# Patient Record
Sex: Male | Born: 1955 | Race: White | Hispanic: No | Marital: Married | Smoking: Current some day smoker
Health system: Southern US, Community
[De-identification: ages and names within clinical notes are randomized; demographics above are authoritative.]

## PROBLEM LIST (undated history)

## (undated) DIAGNOSIS — G4730 Sleep apnea, unspecified: Secondary | ICD-10-CM

## (undated) DIAGNOSIS — M1990 Unspecified osteoarthritis, unspecified site: Secondary | ICD-10-CM

## (undated) DIAGNOSIS — E119 Type 2 diabetes mellitus without complications: Secondary | ICD-10-CM

## (undated) DIAGNOSIS — H9190 Unspecified hearing loss, unspecified ear: Secondary | ICD-10-CM

## (undated) DIAGNOSIS — E785 Hyperlipidemia, unspecified: Secondary | ICD-10-CM

## (undated) DIAGNOSIS — T7840XA Allergy, unspecified, initial encounter: Secondary | ICD-10-CM

## (undated) DIAGNOSIS — M109 Gout, unspecified: Secondary | ICD-10-CM

## (undated) DIAGNOSIS — K219 Gastro-esophageal reflux disease without esophagitis: Secondary | ICD-10-CM

## (undated) HISTORY — DX: Unspecified osteoarthritis, unspecified site: M19.90

## (undated) HISTORY — DX: Hyperlipidemia, unspecified: E78.5

## (undated) HISTORY — DX: Type 2 diabetes mellitus without complications: E11.9

## (undated) HISTORY — DX: Allergy, unspecified, initial encounter: T78.40XA

## (undated) HISTORY — PX: POLYPECTOMY: SHX149

## (undated) HISTORY — DX: Gastro-esophageal reflux disease without esophagitis: K21.9

## (undated) HISTORY — PX: COLONOSCOPY: SHX174

## (undated) HISTORY — DX: Sleep apnea, unspecified: G47.30

## (undated) HISTORY — DX: Gout, unspecified: M10.9

---

## 2004-12-17 ENCOUNTER — Ambulatory Visit: Payer: Self-pay

## 2005-02-04 ENCOUNTER — Ambulatory Visit: Payer: Self-pay | Admitting: Internal Medicine

## 2005-02-23 ENCOUNTER — Ambulatory Visit: Payer: Self-pay | Admitting: Internal Medicine

## 2006-03-23 ENCOUNTER — Ambulatory Visit (HOSPITAL_BASED_OUTPATIENT_CLINIC_OR_DEPARTMENT_OTHER): Admission: RE | Admit: 2006-03-23 | Discharge: 2006-03-23 | Payer: Self-pay | Admitting: Orthopedic Surgery

## 2006-03-23 ENCOUNTER — Encounter (INDEPENDENT_AMBULATORY_CARE_PROVIDER_SITE_OTHER): Payer: Self-pay | Admitting: Specialist

## 2008-09-12 ENCOUNTER — Encounter: Admission: RE | Admit: 2008-09-12 | Discharge: 2008-09-12 | Payer: Self-pay | Admitting: Family Medicine

## 2008-09-14 ENCOUNTER — Encounter: Admission: RE | Admit: 2008-09-14 | Discharge: 2008-09-14 | Payer: Self-pay | Admitting: Family Medicine

## 2010-10-22 NOTE — Op Note (Signed)
NAMEGORGE, ALMANZA NO.:  [id number]   MEDICAL RECORD NO.:  [id number]          PATIENT TYPE:  AMB   LOCATION:  DSC                          FACILITY:  [facility]   PHYSICIAN:  Cindee Salt, M.D.       DATE OF BIRTH:  1955-07-27   DATE OF PROCEDURE:  03/23/2006  DATE OF DISCHARGE:                                 OPERATIVE REPORT   PREOPERATIVE DIAGNOSIS:  Mass left middle finger.   POSTOPERATIVE DIAGNOSIS:  Mass left middle finger.   OPERATION:  Excision mass left middle finger.   SURGEON:  Kuzma.  __________  R.N.   ANESTHESIA:  Forearm based IV regional.   HISTORY:  The patient is a 55 year old male with history of a mass over the  PIP joint of his left middle finger.  This appears to be densely adherent to  the skin.  He is desirous of removal being well aware of risks and  complications including stiffness, recurrence, infection, loss of skin,  dystrophy, injury to arteries, nerves, tendons.   PROCEDURE:  The patient is seen in the preoperative area.  The area of mass  marked by both the patient and surgeon, questions encouraged and answered.   The patient was brought to the operating room, where a forearm based IV  regional anesthetic was carried out without difficulty.  He was prepped  using DuraPrep, supine position, left arm free.  A curvilinear incision was  made for the possibility of rotational flap and  carried down through  subcutaneous tissue.  The mass was found to be densely adherent to the skin  was not adherent to the extensor tendon.  The distal margin was identified.  The skin was excised with the mass distally.  The skin undermined  proximally.  The area isolated proximally and the mass was removed with the  skin both proximally and distally.  The skin was undermined over the  proximal phalanx.  The veins were maintained intact.  The skin was then  advanced without tension with the finger in fully extended position.  This  was then closed  with interrupted 5-0 nylon sutures after repair.  A sterile  compressive dressing and splint to the finger was applied maintaining in  full extension.  The patient tolerated the procedure well and was taken to  the recovery for observation in satisfactory condition.  Mass was sent to  Pathology.   DISCHARGE SUMMARY:  The patient is discharged home to return to [facility] [place] in 1 week on Vicodin.           ______________________________  Cindee Salt, M.D.     GK/MEDQ  D:  03/23/2006  T:  03/24/2006  Job:  [id number]

## 2011-08-20 ENCOUNTER — Other Ambulatory Visit: Payer: Self-pay | Admitting: Internal Medicine

## 2011-09-28 ENCOUNTER — Ambulatory Visit (INDEPENDENT_AMBULATORY_CARE_PROVIDER_SITE_OTHER): Payer: 59 | Admitting: Family Medicine

## 2011-09-28 VITALS — BP 128/80 | HR 73 | Temp 98.7°F | Resp 18 | Ht 71.5 in | Wt 309.0 lb

## 2011-09-28 DIAGNOSIS — Z131 Encounter for screening for diabetes mellitus: Secondary | ICD-10-CM

## 2011-09-28 DIAGNOSIS — E782 Mixed hyperlipidemia: Secondary | ICD-10-CM

## 2011-09-28 DIAGNOSIS — E785 Hyperlipidemia, unspecified: Secondary | ICD-10-CM

## 2011-09-28 DIAGNOSIS — Z0000 Encounter for general adult medical examination without abnormal findings: Secondary | ICD-10-CM

## 2011-09-28 LAB — POCT CBC
Granulocyte percent: 61.9 % (ref 37–80)
HCT, POC: 46.6 % (ref 43.5–53.7)
Hemoglobin: 15.4 g/dL (ref 14.1–18.1)
Lymph, poc: 3.1 (ref 0.6–3.4)
MCH, POC: 31.5 pg — AB (ref 27–31.2)
MCHC: 33 g/dL (ref 31.8–35.4)
MCV: 95.3 fL (ref 80–97)
MID (cbc): 0.5 (ref 0–0.9)
MPV: 8.9 fL (ref 0–99.8)
POC Granulocyte: 5.9 (ref 2–6.9)
POC LYMPH PERCENT: 32.8 % (ref 10–50)
POC MID %: 5.3 % (ref 0–12)
Platelet Count, POC: 280 10*3/uL (ref 142–424)
RBC: 4.89 M/uL (ref 4.69–6.13)
RDW, POC: 14.8 %
WBC: 9.5 10*3/uL (ref 4.6–10.2)

## 2011-09-28 LAB — IFOBT (OCCULT BLOOD): IFOBT: NEGATIVE

## 2011-09-28 LAB — POCT GLYCOSYLATED HEMOGLOBIN (HGB A1C): Hemoglobin A1C: 6.1

## 2011-09-28 MED ORDER — ROSUVASTATIN CALCIUM 10 MG PO TABS: 10.0000 mg | ORAL_TABLET | Freq: Every day | ORAL | Status: DC

## 2011-09-28 MED ORDER — COLCHICINE 0.6 MG PO TABS: 0.6000 mg | ORAL_TABLET | Freq: Every day | ORAL | Status: DC

## 2011-09-28 NOTE — Progress Notes (Signed)
  [facility]:  Office Visit  Chief Complaint:  Chief Complaint  Patient presents with  . Hyperlipidemia    needs refill of med  . Gout    needs refill of med  . CPE    HPI: Gabriel Fernandez is a 56 y.o. male who complains of  Here for annual. No health complaints.   Past Medical History  Diagnosis Date  . Hyperlipidemia    History reviewed. No pertinent past surgical history. History   Social History  . Marital Status: Single    Spouse Name: N/A    Number of Children: N/A  . Years of Education: N/A   Social History Main Topics  . Smoking status: Current Everyday Smoker -- 1.0 packs/day for 35 years  . Smokeless tobacco: None  . Alcohol Use: 0.6 oz/week    1 Glasses of wine per week  . Drug Use: No  . Sexually Active: None   Other Topics Concern  . None   Social History Narrative  . None   Family History  Problem Relation Age of Onset  . Cancer Mother   . Ovarian cancer Mother    No Known Allergies Prior to Admission medications   Medication Sig Start Date End Date Taking? Authorizing Provider  CRESTOR 10 MG tablet TAKE 1 TABLET EVERY DAY 08/20/11  Yes Pattricia Boss, PA-C     ROS: The patient denies fevers, chills, night sweats, unintentional weight loss, chest pain, palpitations, wheezing, dyspnea on exertion, nausea, vomiting, abdominal pain, dysuria, hematuria, melena, + numbness, weakness, or tingling.   All other systems have been reviewed and were otherwise negative with the exception of those mentioned in the HPI and as above.    PHYSICAL EXAM: Filed Vitals:   09/28/11 1623  BP: 128/80  Pulse: 73  Temp: 98.7 F (37.1 C)  Resp: 18   Filed Vitals:   09/28/11 1623  Height: 5' 11.5" (1.816 m)  Weight: 309 lb (140.161 kg)   Body mass index is 42.50 kg/(m^2).  General: Alert, no acute distress, morbidly obese white male HEENT:  Normocephalic, atraumatic, oropharynx patent. Fundoscopic exam nl. EOMI, PERRLA, Tm nl.    Cardiovascular:  Regular rate and rhythm, no rubs murmurs or gallops.  No Carotid bruits, radial pulse intact. No pedal edema.  Respiratory: Clear to auscultation bilaterally.  No wheezes, rales, or rhonchi.  No cyanosis, no use of accessory musculature GI: No organomegaly, abdomen is soft and non-tender, positive bowel sounds.  No masses. Skin: No rashes. Neurologic: Facial musculature symmetric. Sensation intact with microfilament exam Psychiatric: Patient is appropriate throughout our interaction. Lymphatic: No cervical lymphadenopathy Musculoskeletal: Gait intact. GU: no lesions on penis, no testicular masses, prostate nl. No ingunal hernias   LABS: No results found for this or any previous visit.   EKG/XRAY:   Primary read interpreted by Dr. Conley Rolls at [facility].   ASSESSMENT/PLAN: Encounter Diagnoses  Name Primary?  . Annual physical exam Yes  . Hyperlipidemia   . Screening for diabetes mellitus   . Gout    Patient doing well except for obesity and tobacco use. He is going to try quitting with patches. He will continue to try to lose weight through [employer]ystem. Basic labs for CPE done today. Hyperlipidemia-compliant, no SEs Screening for T2 DM due to neuropathy in Gabriel Fernandez although microfilament exam was normal Refill on gout medicine     Gabriel Duran PHUONG, DO 09/28/2011 5:26 PM

## 2011-09-30 LAB — LIPID PANEL
Cholesterol: 208 mg/dL — ABNORMAL HIGH (ref 0–200)
HDL: 61 mg/dL (ref >39)
LDL Cholesterol: 106 mg/dL — ABNORMAL HIGH (ref 0–99)
Total CHOL/HDL Ratio: 3.4 ratio
Triglycerides: 204 mg/dL — ABNORMAL HIGH (ref <150)
VLDL: 41 mg/dL — ABNORMAL HIGH (ref 0–40)

## 2011-09-30 LAB — COMPREHENSIVE METABOLIC PANEL
BUN: 15 mg/dL (ref 6–23)
CO2: 25 mEq/L (ref 19–32)
Calcium: 9.4 mg/dL (ref 8.4–10.5)
Chloride: 104 mEq/L (ref 96–112)
Creat: 0.75 mg/dL (ref 0.50–1.35)

## 2011-09-30 LAB — COMPREHENSIVE METABOLIC PANEL WITH GFR
ALT: 30 U/L (ref 0–53)
AST: 19 U/L (ref 0–37)
Albumin: 4.3 g/dL (ref 3.5–5.2)
Alkaline Phosphatase: 67 U/L (ref 39–117)
Glucose, Bld: 85 mg/dL (ref 70–99)
Potassium: 4.5 meq/L (ref 3.5–5.3)
Sodium: 139 meq/L (ref 135–145)
Total Bilirubin: 0.4 mg/dL (ref 0.3–1.2)
Total Protein: 7.3 g/dL (ref 6.0–8.3)

## 2011-09-30 LAB — TSH: TSH: 1.223 u[IU]/mL (ref 0.350–4.500)

## 2011-09-30 LAB — PSA: PSA: 0.19 ng/mL (ref <=4.00)

## 2012-04-29 ENCOUNTER — Other Ambulatory Visit: Payer: Self-pay | Admitting: Family Medicine

## 2013-04-15 ENCOUNTER — Ambulatory Visit: Payer: 59 | Admitting: Emergency Medicine

## 2013-04-15 ENCOUNTER — Other Ambulatory Visit: Payer: Self-pay | Admitting: Radiology

## 2013-04-15 ENCOUNTER — Telehealth: Payer: Self-pay | Admitting: Radiology

## 2013-04-15 ENCOUNTER — Ambulatory Visit (HOSPITAL_COMMUNITY)
Admission: RE | Admit: 2013-04-15 | Discharge: 2013-04-15 | Disposition: A | Discharge status: Home / Self Care | Payer: 59 | Source: Ambulatory Visit | Attending: Emergency Medicine | Admitting: Emergency Medicine

## 2013-04-15 VITALS — BP 128/80 | HR 97 | Temp 97.8°F | Resp 16 | Ht 71.5 in | Wt 317.4 lb

## 2013-04-15 DIAGNOSIS — M79609 Pain in unspecified limb: Secondary | ICD-10-CM | POA: Insufficient documentation

## 2013-04-15 DIAGNOSIS — M79605 Pain in left leg: Secondary | ICD-10-CM

## 2013-04-15 DIAGNOSIS — M5430 Sciatica, unspecified side: Secondary | ICD-10-CM

## 2013-04-15 DIAGNOSIS — M5432 Sciatica, left side: Secondary | ICD-10-CM

## 2013-04-15 MED ORDER — NAPROXEN SODIUM 550 MG PO TABS: 550.0000 mg | ORAL_TABLET | Freq: Two times a day (BID) | ORAL | Status: DC

## 2013-04-15 NOTE — Patient Instructions (Addendum)
Go to [employer] [facility] [address] [employer], use free valet parking go to Admitting then to vascular lab. Go now, they are expecting you. They will call report while you are there.     Sciatica is pain, weakness, numbness, or tingling along the path of the sciatic nerve. The nerve starts in the lower back and runs down the back of each leg. The nerve controls the muscles in the lower leg and in the back of the knee, while also providing sensation to the back of the thigh, lower leg, and the sole of your foot. Sciatica is a symptom of another medical condition. For instance, nerve damage or certain conditions, such as a herniated disk or bone spur on the spine, pinch or put pressure on the sciatic nerve. This causes the pain, weakness, or other sensations normally associated with sciatica. Generally, sciatica only affects one side of the body. CAUSES   Herniated or slipped disc.  Degenerative disk disease.  A pain disorder involving the narrow muscle in the buttocks (piriformis syndrome).  Pelvic injury or fracture.  Pregnancy.  Tumor (rare). SYMPTOMS  Symptoms can vary from mild to very severe. The symptoms usually travel from the low back to the buttocks and down the back of the leg. Symptoms can include:  Mild tingling or dull aches in the lower back, leg, or hip.  Numbness in the back of the calf or sole of the foot.  Burning sensations in the lower back, leg, or hip.  Sharp pains in the lower back, leg, or hip.  Leg weakness.  Severe back pain inhibiting movement. These symptoms may get worse with coughing, sneezing, laughing, or prolonged sitting or standing. Also, being overweight may worsen symptoms. DIAGNOSIS  Your caregiver will perform a physical exam to look for common symptoms of sciatica. He or she may ask you to do certain movements or activities that would trigger sciatic nerve pain. Other tests may be performed to find the cause of the sciatica. These may  include:  Blood tests.  X-rays.  Imaging tests, such as an MRI or CT scan. TREATMENT  Treatment is directed at the cause of the sciatic pain. Sometimes, treatment is not necessary and the pain and discomfort goes away on its own. If treatment is needed, your caregiver may suggest:  Over-the-counter medicines to relieve pain.  Prescription medicines, such as anti-inflammatory medicine, muscle relaxants, or narcotics.  Applying heat or ice to the painful area.  Steroid injections to lessen pain, irritation, and inflammation around the nerve.  Reducing activity during periods of pain.  Exercising and stretching to strengthen your abdomen and improve flexibility of your spine. Your caregiver may suggest losing weight if the extra weight makes the back pain worse.  Physical therapy.  Surgery to eliminate what is pressing or pinching the nerve, such as a bone spur or part of a herniated disk. HOME CARE INSTRUCTIONS   Only take over-the-counter or prescription medicines for pain or discomfort as directed by your caregiver.  Apply ice to the affected area for 20 minutes, 3 4 times a day for the first 48 72 hours. Then try heat in the same way.  Exercise, stretch, or perform your usual activities if these do not aggravate your pain.  Attend physical therapy sessions as directed by your caregiver.  Keep all follow-up appointments as directed by your caregiver.  Do not wear high heels or shoes that do not provide proper support.  Check your mattress to see if it is  too soft. A firm mattress may lessen your pain and discomfort. SEEK IMMEDIATE MEDICAL CARE IF:   You lose control of your bowel or bladder (incontinence).  You have increasing weakness in the lower back, pelvis, buttocks, or legs.  You have redness or swelling of your back.  You have a burning sensation when you urinate.  You have pain that gets worse when you lie down or awakens you at night.  Your pain is worse  than you have experienced in the past.  Your pain is lasting longer than 4 weeks.  You are suddenly losing weight without reason. MAKE SURE YOU:  Understand these instructions.  Will watch your condition.  Will get help right away if you are not doing well or get worse. Document Released: 05/17/2001 Document Revised: 11/22/2011 Document Reviewed: 10/02/2011 [facility] Patient Information 2014 [place], [place].

## 2013-04-15 NOTE — Telephone Encounter (Signed)
Call from the Vascular Lab, the doppler study is negative for DVT, patient advised it is negative, and sent home. He is told we will call him about the MRI

## 2013-04-15 NOTE — Progress Notes (Signed)
[facility] [address], [place] [address] [phone]  Date:  04/15/2013   Name:  Gabriel Fernandez   DOB:  Jul 25, 1955   MRN:  [id number]  PCP:  No primary provider on file.    Chief Complaint: Leg Pain   History of Present Illness:  Gabriel Fernandez is a 57 y.o. very pleasant male patient who presents with the following:  No history of injury or overuse.  Flies to and from [place] frequently.  Last trip 1 week ago.  Noticed pain in his left leg and left groin.  Intermittent in nature and associated with position worse when sitting or standing.  Some numbness in toes and weakness in left leg.  No history of sciatica.  Chronic mild edema in lower extremities.  No improvement with over the counter medications or other home remedies. Denies other complaint or health concern today..  There are no active problems to display for this patient.   Past Medical History  Diagnosis Date  . Hyperlipidemia     History reviewed. No pertinent past surgical history.  History  Substance Use Topics  . Smoking status: Current Every Day Smoker -- 1.00 packs/day for 35 years  . Smokeless tobacco: Not on file  . Alcohol Use: 0.6 oz/week    1 Glasses of wine per week    Family History  Problem Relation Age of Onset  . Cancer Mother   . Ovarian cancer Mother     No Known Allergies  Medication list has been reviewed and updated.  Current Outpatient Prescriptions on File Prior to Visit  Medication Sig Dispense Refill  . rosuvastatin (CRESTOR) 10 MG tablet Take 1 tablet (10 mg total) by mouth daily. Need office visit for additional refills.  30 tablet  0  . colchicine 0.6 MG tablet Take 1 tablet (0.6 mg total) by mouth daily. As needed for flare up  30 tablet  0   No current facility-administered medications on file prior to visit.    Review of Systems:  As per HPI, otherwise negative.    Physical Examination: Filed Vitals:   04/15/13 1440  BP: 128/80   Pulse: 97  Temp: 97.8 F (36.6 C)  Resp: 16   Filed Vitals:   04/15/13 1440  Height: 5' 11.5" (1.816 m)  Weight: 317 lb 6.4 oz (143.972 kg)   Body mass index is 43.66 kg/(m^2). Ideal Body Weight: Weight in (lb) to have BMI = 25: 181.4  GEN: obese, NAD, Non-toxic, A & O x 3 HEENT: Atraumatic, Normocephalic. Neck supple. No masses, No LAD. Ears and Nose: No external deformity. CV: RRR, No M/G/R. No JVD. No thrill. No extra heart sounds. PULM: CTA B, no wheezes, crackles, rhonchi. No retractions. No resp. distress. No accessory muscle use. ABD: S, NT, ND, +BS. No rebound. No HSM. EXTR: No c/c/e NEURO Normal gait.  Mild weakness left leg knee extension. PSYCH: Normally interactive. Conversant. Not depressed or anxious appearing.  Calm demeanor.    Assessment and Plan: Sciatic neuritis Rule out DVT due travel history Anaprox   Signed,  Phillips Odor, MD

## 2013-04-15 NOTE — Progress Notes (Signed)
VASCULAR LAB PRELIMINARY  PRELIMINARY  PRELIMINARY  PRELIMINARY  Left lower extremity venous duplex completed.    Preliminary report:  Left leg is negative for deep and superficial vein thrombosis.  Negative for Baker's cyst on left.  Report called to Amy in Dr. Shela Commons. Anderson's office.  Instructions given to inform patient of negative results and they will call him when they get the MRI set up.  Dustine Bertini, RVT 04/15/2013, 5:00 PM

## 2013-05-01 ENCOUNTER — Ambulatory Visit
Admission: RE | Admit: 2013-05-01 | Discharge: 2013-05-01 | Disposition: A | Discharge status: Home / Self Care | Payer: 59 | Source: Ambulatory Visit | Attending: Emergency Medicine | Admitting: Emergency Medicine

## 2013-05-01 DIAGNOSIS — M5432 Sciatica, left side: Secondary | ICD-10-CM

## 2013-05-02 ENCOUNTER — Other Ambulatory Visit: Payer: Self-pay | Admitting: Emergency Medicine

## 2013-05-02 DIAGNOSIS — M5430 Sciatica, unspecified side: Secondary | ICD-10-CM

## 2013-05-06 ENCOUNTER — Other Ambulatory Visit: Payer: Self-pay | Admitting: Emergency Medicine

## 2013-05-07 NOTE — Telephone Encounter (Signed)
Pt will need follow up for additional refills.

## 2013-06-08 ENCOUNTER — Ambulatory Visit (INDEPENDENT_AMBULATORY_CARE_PROVIDER_SITE_OTHER): Payer: 59 | Admitting: Internal Medicine

## 2013-06-08 VITALS — BP 140/90 | HR 74 | Temp 98.8°F | Resp 16 | Ht 72.0 in | Wt 313.0 lb

## 2013-06-08 DIAGNOSIS — M109 Gout, unspecified: Secondary | ICD-10-CM

## 2013-06-08 DIAGNOSIS — Z23 Encounter for immunization: Secondary | ICD-10-CM

## 2013-06-08 DIAGNOSIS — F17200 Nicotine dependence, unspecified, uncomplicated: Secondary | ICD-10-CM | POA: Insufficient documentation

## 2013-06-08 DIAGNOSIS — M791 Myalgia, unspecified site: Secondary | ICD-10-CM

## 2013-06-08 DIAGNOSIS — E785 Hyperlipidemia, unspecified: Secondary | ICD-10-CM | POA: Insufficient documentation

## 2013-06-08 MED ORDER — COLCHICINE 0.6 MG PO TABS: 0.6000 mg | ORAL_TABLET | Freq: Every day | ORAL | Status: DC

## 2013-06-08 MED ORDER — ATORVASTATIN CALCIUM 20 MG PO TABS: 20.0000 mg | ORAL_TABLET | Freq: Every day | ORAL | Status: DC

## 2013-06-08 NOTE — Progress Notes (Signed)
   Subjective:    Patient ID: Gabriel Fernandez, male    DOB: June 20, 1955, 58 y.o.   MRN: [id number]  HPIneeds flu shot /med refills [facility] and Gout PCP in [place] retired and he is waiting for a new doctor/last labs were similar to those done here in April 2013 Describes no medical problems other than a persistent aching in his left anterior that over the last several months Would like to change Crestor to something cheaper  Still smoking Still overweight severely  Review of Systems  Constitutional: Negative for activity change, appetite change and unexpected weight change.  Eyes: Negative for visual disturbance.  Respiratory: Negative for shortness of breath and wheezing.   Cardiovascular: Negative for chest pain, palpitations and leg swelling.  Neurological: Negative for headaches.       Objective:   Physical Exam BP 140/90  Pulse 74  Temp(Src) 98.8 F (37.1 C) (Oral)  Resp 16  Ht 6' (1.829 m)  Wt 313 lb (141.976 kg)  BMI 42.44 kg/m2  SpO2 97% HEENT clear Heart regular Lungs clear No peripheral edema Peripheral pulses intact Vague tenderness in left anterior thigh without masses or defects       Assessment & Plan:  Gout - he is to discontinue colchicine which has been taking daily for a long time and see if he actually has gout/he needs uric acid testing  I warned him about the potential combination of colchicine and statins Need for prophylactic vaccination and inoculation against influenza - Plan: Flu Vaccine QUAD 36+ mos PF IM (Fluarix)  Smoker  Other and unspecified hyperlipidemia  Severe obesity (BMI >= 40)  Myalgia  Obviously he is at great risk for an acute event/we discussed significant weight loss/he must commit to stopping smoking Suggested he stop Crestor for one month to see if this myalgia resolves May restart as Lipitor He is to set up a CPE either here or in his town  Meds ordered this encounter  Medications  . atorvastatin (LIPITOR) 20 MG  tablet    Sig: Take 1 tablet (20 mg total) by mouth daily.    Dispense:  90 tablet    Refill:  1  . colchicine 0.6 MG tablet    Sig: Take 1 tablet (0.6 mg total) by mouth daily. As needed for flare up    Dispense:  30 tablet    Refill:  0

## 2013-09-06 ENCOUNTER — Ambulatory Visit: Payer: 59 | Admitting: Emergency Medicine

## 2013-09-06 VITALS — BP 132/78 | HR 85 | Temp 98.9°F | Resp 16 | Ht 71.0 in | Wt 314.0 lb

## 2013-09-06 DIAGNOSIS — H1010 Acute atopic conjunctivitis, unspecified eye: Secondary | ICD-10-CM

## 2013-09-06 DIAGNOSIS — Z23 Encounter for immunization: Secondary | ICD-10-CM

## 2013-09-06 MED ORDER — OLOPATADINE HCL 0.1 % OP SOLN: 1.0000 [drp] | Freq: Two times a day (BID) | OPHTHALMIC | Status: DC

## 2013-09-06 NOTE — Progress Notes (Signed)
[facility] [address] [place] [address] [phone]  Date:  09/06/2013   Name:  CARTIER WASHKO   DOB:  19-Jul-1955   MRN:  [id number]  PCP:  No primary provider on file.    Chief Complaint: Immunizations and Eye Drainage   History of Present Illness:  ESIQUIO BOESEN is a 58 y.o. very pleasant male patient who presents with the following:  Traveling to [place] tomorrow and requests a hepatitis A immunization.  Also says he has itchy watery eyes.  No seasonal allergic symptoms in the past.  Not gluing or purulent.  No cough or coryza.  No fever or chills.  Denies other complaint or health concern today.   Patient Active Problem List   Diagnosis Date Noted  . Smoker 06/08/2013  . Other and unspecified hyperlipidemia 06/08/2013  . Gout 06/08/2013  . Severe obesity (BMI >= 40) 06/08/2013    Past Medical History  Diagnosis Date  . Hyperlipidemia     History reviewed. No pertinent past surgical history.  History  Substance Use Topics  . Smoking status: Current Every Day Smoker -- 1.00 packs/day for 35 years  . Smokeless tobacco: Not on file  . Alcohol Use: 0.6 oz/week    1 Glasses of wine per week    Family History  Problem Relation Age of Onset  . Cancer Mother   . Ovarian cancer Mother     No Known Allergies  Medication list has been reviewed and updated.  Current Outpatient Prescriptions on File Prior to Visit  Medication Sig Dispense Refill  . atorvastatin (LIPITOR) 20 MG tablet Take 1 tablet (20 mg total) by mouth daily.  90 tablet  1  . colchicine 0.6 MG tablet Take 1 tablet (0.6 mg total) by mouth daily. As needed for flare up  30 tablet  0  . naproxen sodium (ANAPROX) 550 MG tablet TAKE 1 TABLET BY MOUTH TWICE A DAY WITH A MEAL  14 tablet  0   No current facility-administered medications on file prior to visit.    Review of Systems:  As per HPI, otherwise negative.    Physical Examination: Filed Vitals:   09/06/13 1603   BP: 132/78  Pulse: 85  Temp: 98.9 F (37.2 C)  Resp: 16   Filed Vitals:   09/06/13 1603  Height: 5\' 11"  (1.803 m)  Weight: 314 lb (142.429 kg)   Body mass index is 43.81 kg/(m^2). Ideal Body Weight: Weight in (lb) to have BMI = 25: 178.9  GEN: WDWN, NAD, Non-toxic, A & O x 3 HEENT: Atraumatic, Normocephalic. Neck supple. No masses, No LAD.  Mild conjunctival injection. No exudate or marginal crusting. Ears and Nose: No external deformity. CV: RRR, No M/G/R. No JVD. No thrill. No extra heart sounds. PULM: CTA B, no wheezes, crackles, rhonchi. No retractions. No resp. distress. No accessory muscle use. ABD: S, NT, ND, +BS. No rebound. No HSM. EXTR: No c/c/e NEURO Normal gait.  PSYCH: Normally interactive. Conversant. Not depressed or anxious appearing.  Calm demeanor.    Assessment and Plan: Atopic conjunctivitis patanase HEP A  Follow up in 6-12 months  Signed,  Ellison Carwin, MD

## 2013-09-06 NOTE — Patient Instructions (Signed)

## 2013-11-15 ENCOUNTER — Other Ambulatory Visit: Payer: Self-pay | Admitting: Internal Medicine

## 2013-12-05 ENCOUNTER — Ambulatory Visit (INDEPENDENT_AMBULATORY_CARE_PROVIDER_SITE_OTHER): Payer: 59 | Admitting: Emergency Medicine

## 2013-12-05 VITALS — BP 144/84 | HR 82 | Temp 97.8°F | Resp 18 | Ht 72.5 in | Wt 311.0 lb

## 2013-12-05 DIAGNOSIS — N400 Enlarged prostate without lower urinary tract symptoms: Secondary | ICD-10-CM

## 2013-12-05 DIAGNOSIS — M5430 Sciatica, unspecified side: Secondary | ICD-10-CM

## 2013-12-05 DIAGNOSIS — M5432 Sciatica, left side: Secondary | ICD-10-CM

## 2013-12-05 LAB — CBC WITH DIFFERENTIAL/PLATELET
BASOS ABS: 0 10*3/uL (ref 0.0–0.1)
BASOS PCT: 0 % (ref 0–1)
EOS ABS: 0.4 10*3/uL (ref 0.0–0.7)
Eosinophils Relative: 5 % (ref 0–5)
HCT: 41.6 % (ref 39.0–52.0)
HEMOGLOBIN: 14.7 g/dL (ref 13.0–17.0)
Lymphocytes Relative: 19 % (ref 12–46)
Lymphs Abs: 1.4 10*3/uL (ref 0.7–4.0)
MCH: 32.1 pg (ref 26.0–34.0)
MCHC: 35.3 g/dL (ref 30.0–36.0)
MCV: 90.8 fL (ref 78.0–100.0)
MONOS PCT: 7 % (ref 3–12)
Monocytes Absolute: 0.5 10*3/uL (ref 0.1–1.0)
NEUTROS ABS: 5 10*3/uL (ref 1.7–7.7)
NEUTROS PCT: 69 % (ref 43–77)
PLATELETS: 244 10*3/uL (ref 150–400)
RBC: 4.58 MIL/uL (ref 4.22–5.81)
RDW: 13.4 % (ref 11.5–15.5)
WBC: 7.3 10*3/uL (ref 4.0–10.5)

## 2013-12-05 MED ORDER — TRAMADOL HCL 50 MG PO TABS: 50.0000 mg | ORAL_TABLET | Freq: Three times a day (TID) | ORAL | Status: DC | PRN

## 2013-12-05 MED ORDER — CYCLOBENZAPRINE HCL 10 MG PO TABS: 10.0000 mg | ORAL_TABLET | Freq: Three times a day (TID) | ORAL | Status: DC | PRN

## 2013-12-05 MED ORDER — NAPROXEN SODIUM 550 MG PO TABS: 550.0000 mg | ORAL_TABLET | Freq: Two times a day (BID) | ORAL | Status: AC

## 2013-12-05 MED ORDER — TAMSULOSIN HCL 0.4 MG PO CAPS: 0.4000 mg | ORAL_CAPSULE | Freq: Every day | ORAL | Status: DC

## 2013-12-05 NOTE — Progress Notes (Signed)
[facility] [address] [place] [address] [phone]  Date:  12/05/2013   Name:  Gabriel Fernandez   DOB:  [birth year]   MRN:  [id number]  PCP:  No PCP Per Patient    Chief Complaint: Leg Pain and Foot Swelling   History of Present Illness:  Gabriel Fernandez is a 58 y.o. very pleasant male patient who presents with the following:  Patient with a history of sciatica in left leg now to office with pain and numbness in left leg.  No history of injury or overuse.  History of gout with no symptoms of a flare.  Says the pain and numbness have popped up over the past 4 days and are similar to his last bout of sciatic pain.  He had a consultation with neurosurgeon but no records area available.  Is concerned as he has some swelling and redness on the dorsum of the left foot.  No warmth or tenderness.   Has frequent nighttime urination with dribbling and urgency incontinence.  No improvement with over the counter medications or other home remedies. Denies other complaint or health concern today.   Patient Active Problem List   Diagnosis Date Noted  . Smoker 06/08/2013  . Other and unspecified hyperlipidemia 06/08/2013  . Gout 06/08/2013  . Severe obesity (BMI >= 40) 06/08/2013    Past Medical History  Diagnosis Date  . Hyperlipidemia     History reviewed. No pertinent past surgical history.  History  Substance Use Topics  . Smoking status: Current Every Day Smoker -- 1.00 packs/day for 35 years  . Smokeless tobacco: Not on file  . Alcohol Use: 0.6 oz/week    1 Glasses of wine per week    Family History  Problem Relation Age of Onset  . Cancer Mother   . Ovarian cancer Mother     No Known Allergies  Medication list has been reviewed and updated.  Current Outpatient Prescriptions on File Prior to Visit  Medication Sig Dispense Refill  . atorvastatin (LIPITOR) 20 MG tablet Take 1 tablet (20 mg total) by mouth daily.  90 tablet  1  . colchicine  (COLCRYS) 0.6 MG tablet Take 1 tablet by mouth daily as needed for flare up. Dx code: 274.9. PATIENT NEEDS OFFICE VISIT FOR ADDITIONAL REFILLS  30 tablet  0  . naproxen sodium (ANAPROX) 550 MG tablet TAKE 1 TABLET BY MOUTH TWICE A DAY WITH A MEAL  14 tablet  0  . olopatadine (PATANOL) 0.1 % ophthalmic solution Place 1 drop into both eyes 2 (two) times daily.  5 mL  12   No current facility-administered medications on file prior to visit.    Review of Systems:  As per HPI, otherwise negative.    Physical Examination: Filed Vitals:   12/05/13 1503  BP: 144/84  Pulse: 82  Temp: 97.8 F (36.6 C)  Resp: 18   Filed Vitals:   12/05/13 1503  Height: 6' 0.5" (1.842 m)  Weight: 311 lb (141.069 kg)   Body mass index is 41.58 kg/(m^2). Ideal Body Weight: Weight in (lb) to have BMI = 25: 186.5   GEN: WDWN, NAD, Non-toxic, Alert & Oriented x 3 HEENT: Atraumatic, Normocephalic.  Ears and Nose: No external deformity. EXTR: No clubbing/cyanosis/edema.  Left leg no calf or thigh tenderness, warmth or cellulitis.  NEURO: Normal gait.  PSYCH: Normally interactive. Conversant. Not depressed or anxious appearing.  Calm demeanor.    Assessment and Plan: Sciatic neuritis  Anaprox  Flexeril Ultram Referred back to PT.  Signed,  Ellison Carwin, MD

## 2013-12-05 NOTE — Patient Instructions (Signed)
Sciatica Sciatica is pain, weakness, numbness, or tingling along the path of the sciatic nerve. The nerve starts in the lower back and runs down the back of each leg. The nerve controls the muscles in the lower leg and in the back of the knee, while also providing sensation to the back of the thigh, lower leg, and the sole of your foot. Sciatica is a symptom of another medical condition. For instance, nerve damage or certain conditions, such as a herniated disk or bone spur on the spine, pinch or put pressure on the sciatic nerve. This causes the pain, weakness, or other sensations normally associated with sciatica. Generally, sciatica only affects one side of the body. CAUSES   Herniated or slipped disc.  Degenerative disk disease.  A pain disorder involving the narrow muscle in the buttocks (piriformis syndrome).  Pelvic injury or fracture.  Pregnancy.  Tumor (rare). SYMPTOMS  Symptoms can vary from mild to very severe. The symptoms usually travel from the low back to the buttocks and down the back of the leg. Symptoms can include:  Mild tingling or dull aches in the lower back, leg, or hip.  Numbness in the back of the calf or sole of the foot.  Burning sensations in the lower back, leg, or hip.  Sharp pains in the lower back, leg, or hip.  Leg weakness.  Severe back pain inhibiting movement. These symptoms may get worse with coughing, sneezing, laughing, or prolonged sitting or standing. Also, being overweight may worsen symptoms. DIAGNOSIS  Your caregiver will perform a physical exam to look for common symptoms of sciatica. He or she may ask you to do certain movements or activities that would trigger sciatic nerve pain. Other tests may be performed to find the cause of the sciatica. These may include:  Blood tests.  X-rays.  Imaging tests, such as an MRI or CT scan. TREATMENT  Treatment is directed at the cause of the sciatic pain. Sometimes, treatment is not necessary  and the pain and discomfort goes away on its own. If treatment is needed, your caregiver may suggest:  Over-the-counter medicines to relieve pain.  Prescription medicines, such as anti-inflammatory medicine, muscle relaxants, or narcotics.  Applying heat or ice to the painful area.  Steroid injections to lessen pain, irritation, and inflammation around the nerve.  Reducing activity during periods of pain.  Exercising and stretching to strengthen your abdomen and improve flexibility of your spine. Your caregiver may suggest losing weight if the extra weight makes the back pain worse.  Physical therapy.  Surgery to eliminate what is pressing or pinching the nerve, such as a bone spur or part of a herniated disk. HOME CARE INSTRUCTIONS   Only take over-the-counter or prescription medicines for pain or discomfort as directed by your caregiver.  Apply ice to the affected area for 20 minutes, 3-4 times a day for the first 48-72 hours. Then try heat in the same way.  Exercise, stretch, or perform your usual activities if these do not aggravate your pain.  Attend physical therapy sessions as directed by your caregiver.  Keep all follow-up appointments as directed by your caregiver.  Do not wear high heels or shoes that do not provide proper support.  Check your mattress to see if it is too soft. A firm mattress may lessen your pain and discomfort. SEEK IMMEDIATE MEDICAL CARE IF:   You lose control of your bowel or bladder (incontinence).  You have increasing weakness in the lower back, pelvis, buttocks,   or legs.  You have redness or swelling of your back.  You have a burning sensation when you urinate.  You have pain that gets worse when you lie down or awakens you at night.  Your pain is worse than you have experienced in the past.  Your pain is lasting longer than 4 weeks.  You are suddenly losing weight without reason. MAKE SURE YOU:  Understand these  instructions.  Will watch your condition.  Will get help right away if you are not doing well or get worse. Document Released: 05/17/2001 Document Revised: 11/22/2011 Document Reviewed: 10/02/2011 Exit[employer] Patient Information 2015 [employer]. This information is not intended to replace advice given to you by your health care provider. Make sure you discuss any questions you have with your health care provider.  

## 2013-12-06 ENCOUNTER — Encounter: Payer: Self-pay | Admitting: Family Medicine

## 2013-12-21 ENCOUNTER — Other Ambulatory Visit: Payer: Self-pay | Admitting: Emergency Medicine

## 2013-12-23 NOTE — Telephone Encounter (Signed)
Do you want to give RFs? 

## 2014-01-19 ENCOUNTER — Other Ambulatory Visit: Payer: Self-pay | Admitting: Emergency Medicine

## 2014-01-20 NOTE — Telephone Encounter (Signed)
Dr Ouida Sills, I know you don't want to RF the tramadol, but do you want to give RFs on naproxen or does pt need to RTC for re-eval?

## 2014-01-21 NOTE — Telephone Encounter (Signed)
Needs ov

## 2014-01-24 NOTE — Telephone Encounter (Signed)
Pharm at [facility] on [address] notified and they said they would tell pt.

## 2014-03-23 ENCOUNTER — Other Ambulatory Visit: Payer: Self-pay | Admitting: Internal Medicine

## 2014-04-02 ENCOUNTER — Other Ambulatory Visit: Payer: Self-pay | Admitting: Emergency Medicine

## 2014-06-04 ENCOUNTER — Other Ambulatory Visit: Payer: Self-pay | Admitting: Internal Medicine

## 2014-06-04 ENCOUNTER — Other Ambulatory Visit: Payer: Self-pay | Admitting: Emergency Medicine

## 2014-06-29 ENCOUNTER — Other Ambulatory Visit: Payer: Self-pay | Admitting: Internal Medicine

## 2014-07-01 ENCOUNTER — Other Ambulatory Visit: Payer: Self-pay | Admitting: Internal Medicine

## 2014-08-27 ENCOUNTER — Other Ambulatory Visit: Payer: Self-pay | Admitting: Physician Assistant

## 2014-10-23 ENCOUNTER — Other Ambulatory Visit: Payer: Self-pay | Admitting: Physician Assistant

## 2014-11-03 ENCOUNTER — Ambulatory Visit (INDEPENDENT_AMBULATORY_CARE_PROVIDER_SITE_OTHER): Payer: BLUE CROSS/BLUE SHIELD | Admitting: Family Medicine

## 2014-11-03 VITALS — BP 128/84 | HR 72 | Temp 97.7°F | Resp 20 | Ht 72.0 in | Wt 316.4 lb

## 2014-11-03 DIAGNOSIS — N400 Enlarged prostate without lower urinary tract symptoms: Secondary | ICD-10-CM

## 2014-11-03 DIAGNOSIS — E785 Hyperlipidemia, unspecified: Secondary | ICD-10-CM | POA: Diagnosis not present

## 2014-11-03 DIAGNOSIS — E119 Type 2 diabetes mellitus without complications: Secondary | ICD-10-CM | POA: Diagnosis not present

## 2014-11-03 LAB — POCT GLYCOSYLATED HEMOGLOBIN (HGB A1C): HEMOGLOBIN A1C: 6.7

## 2014-11-03 MED ORDER — TAMSULOSIN HCL 0.4 MG PO CAPS: 0.4000 mg | ORAL_CAPSULE | Freq: Every day | ORAL | Status: DC

## 2014-11-03 MED ORDER — METFORMIN HCL 500 MG PO TABS: 500.0000 mg | ORAL_TABLET | Freq: Two times a day (BID) | ORAL | Status: DC

## 2014-11-03 MED ORDER — ATORVASTATIN CALCIUM 20 MG PO TABS: ORAL_TABLET | ORAL | Status: DC

## 2014-11-03 NOTE — Progress Notes (Signed)
  Subjective:  Patient ID: Gabriel Fernandez, male    DOB: 1956-01-22  Age: 59 y.o. MRN: [id number]  Patient is here for the first time in a while. He is mostly living in [place] now. He needs a refill on his medications. He's been out of his cholesterol medicine for a month. He continues to smoke for a few cigarettes to a pack a day. He drinks some alcohol. He was so if he is diabetic. He has not been getting a lot of regular exercise other intends to. Has a sore place on his right gum it comes and goes. It is pretty well gone right now. He sees his dentist in a month or 2. He has no headaches or dizziness. No chest pains or excessive shortness of breath. He knows his endurance is not much good. His abdomen has no complaints. Still has some urinary difficulties, but it's mostly difficulty holding it in the daytime. Says he probably shouldn't drinks much coffee.   Objective:   Morbidly obese. Alert and oriented. Throat clear. Minimal ulceration in right pupil mucosa. Neck supple without nodes. Chest clear. Heart regular without murmurs. And soft the mass or tenderness. 1-2+ ankle edema.  Results for orders placed or performed in visit on 11/03/14  POCT glycosylated hemoglobin (Hb A1C)  Result Value Ref Range   Hemoglobin A1C 6.7      Assessment & Plan:   Assessment: Obesity Hyperlipidemia Mouth ulcer Tobacco use disorder Edema    Plan: Read the American diabetic Association website  Take metformin twice daily  Return in 3 months He knows he needs to do some lifestyle changes. He is going to see his dentist sometime and will ask about the mouth sore. Patient Instructions  Ask your dentist to check the sore place in her mouth.  Continue lipid-lowering medication which we refilled today.  You need to make some lifestyle decisions or you are going to get in big trouble from the combination or smoking, cholesterol,diabetes and being overweight and deconditioned.  If your urinary  symptoms get worse we should refer you to a neurologist. Please let us know. Advise drinking less coffee.  Begin metformin 500 mg twice daily  Try to return in about 3 months to recheck some labs on you     Asiah Browder, MD 11/03/2014

## 2014-11-03 NOTE — Patient Instructions (Addendum)
Ask your dentist to check the sore place in her mouth.  Continue lipid-lowering medication which we refilled today.  You need to make some lifestyle decisions or you are going to get in big trouble from the combination or smoking, cholesterol,diabetes and being overweight and deconditioned.  If your urinary symptoms get worse we should refer you to a neurologist. Please let us know. Advise drinking less coffee.  Begin metformin 500 mg twice daily  Try to return in about 3 months to recheck some labs on you

## 2014-11-04 LAB — COMPLETE METABOLIC PANEL WITH GFR
ALBUMIN: 4.1 g/dL (ref 3.5–5.2)
ALT: 30 U/L (ref 0–53)
AST: 21 U/L (ref 0–37)
Alkaline Phosphatase: 64 U/L (ref 39–117)
BILIRUBIN TOTAL: 0.4 mg/dL (ref 0.2–1.2)
BUN: 16 mg/dL (ref 6–23)
CO2: 26 mEq/L (ref 19–32)
Calcium: 9.6 mg/dL (ref 8.4–10.5)
Chloride: 103 mEq/L (ref 96–112)
Creat: 0.83 mg/dL (ref 0.50–1.35)
GFR, Est Non African American: >89 mL/min
GLUCOSE: 103 mg/dL — AB (ref 70–99)
POTASSIUM: 4.5 meq/L (ref 3.5–5.3)
Sodium: 139 mEq/L (ref 135–145)
TOTAL PROTEIN: 7 g/dL (ref 6.0–8.3)

## 2014-11-04 LAB — LIPID PANEL
CHOL/HDL RATIO: 3.4 ratio
Cholesterol: 197 mg/dL (ref 0–200)
HDL: 58 mg/dL (ref >=40)
LDL Cholesterol: 104 mg/dL — ABNORMAL HIGH (ref 0–99)
TRIGLYCERIDES: 175 mg/dL — AB (ref <150)
VLDL: 35 mg/dL (ref 0–40)

## 2014-11-05 ENCOUNTER — Encounter: Payer: Self-pay | Admitting: Family Medicine

## 2014-11-05 LAB — PSA: PSA: 0.52 ng/mL (ref <=4.00)

## 2014-12-04 ENCOUNTER — Encounter: Payer: Self-pay | Admitting: Internal Medicine

## 2014-12-08 ENCOUNTER — Other Ambulatory Visit: Payer: Self-pay | Admitting: Family Medicine

## 2015-02-19 ENCOUNTER — Other Ambulatory Visit: Payer: Self-pay

## 2015-02-19 MED ORDER — TAMSULOSIN HCL 0.4 MG PO CAPS: 0.4000 mg | ORAL_CAPSULE | Freq: Every day | ORAL | Status: DC

## 2015-06-14 ENCOUNTER — Other Ambulatory Visit: Payer: Self-pay | Admitting: Urgent Care

## 2015-07-19 ENCOUNTER — Other Ambulatory Visit: Payer: Self-pay | Admitting: Family Medicine

## 2015-09-29 ENCOUNTER — Ambulatory Visit (INDEPENDENT_AMBULATORY_CARE_PROVIDER_SITE_OTHER): Payer: BLUE CROSS/BLUE SHIELD | Admitting: Urgent Care

## 2015-09-29 VITALS — BP 138/90 | HR 68 | Temp 97.7°F | Resp 18 | Wt 311.0 lb

## 2015-09-29 DIAGNOSIS — F17200 Nicotine dependence, unspecified, uncomplicated: Secondary | ICD-10-CM | POA: Diagnosis not present

## 2015-09-29 DIAGNOSIS — E785 Hyperlipidemia, unspecified: Secondary | ICD-10-CM | POA: Diagnosis not present

## 2015-09-29 DIAGNOSIS — F1010 Alcohol abuse, uncomplicated: Secondary | ICD-10-CM | POA: Diagnosis not present

## 2015-09-29 DIAGNOSIS — N400 Enlarged prostate without lower urinary tract symptoms: Secondary | ICD-10-CM

## 2015-09-29 DIAGNOSIS — M109 Gout, unspecified: Secondary | ICD-10-CM | POA: Diagnosis not present

## 2015-09-29 DIAGNOSIS — E119 Type 2 diabetes mellitus without complications: Secondary | ICD-10-CM | POA: Diagnosis not present

## 2015-09-29 DIAGNOSIS — Z0000 Encounter for general adult medical examination without abnormal findings: Secondary | ICD-10-CM

## 2015-09-29 LAB — POCT GLYCOSYLATED HEMOGLOBIN (HGB A1C): Hemoglobin A1C: 6.6

## 2015-09-29 MED ORDER — METFORMIN HCL 500 MG PO TABS: 500.0000 mg | ORAL_TABLET | Freq: Every day | ORAL | Status: DC

## 2015-09-29 MED ORDER — ZOSTER VACCINE LIVE 19400 UNT/0.65ML ~~LOC~~ SUSR: 0.6500 mL | Freq: Once | SUBCUTANEOUS | Status: DC

## 2015-09-29 MED ORDER — TAMSULOSIN HCL 0.4 MG PO CAPS: 0.4000 mg | ORAL_CAPSULE | Freq: Every day | ORAL | Status: DC

## 2015-09-29 NOTE — Progress Notes (Signed)
MRN: [id number]  Subjective:   [name] is a 60 y.o. male presenting for annual physical exam and medication refills.  Medical care team includes: PCP: No PCP Per Patient Vision: Has not gotten consistent eye care. Dental: Dr. Randol Kern does cleanings every 6 months. Specialists: Colonoscopy completed at 60 y/o, Dr. Olevia Perches, was on 10 year follow up. He is due for another.   Patient works as an Teaching laboratory technician. He is married, has 1 adult child. Smokes 1 ppd, has 30 pack year history and is not interested in quitting. He also drinks 1 bottle of wine daily-every other day.  Diabetes - takes Metformin. Compliance is inconsistent. Denies chest pain, shob, n/v, abdominal pain, hematuria, skin infections, foot ulcerations, blurred vision, polydipsia.   HL - takes atorvastatin. Compliance is good. Diet is unhealthy. No exercise. No added salt. Denies myalgia.   BPH - takes Flomax. However, he has not been back for f/u so has not had refills. He reports weak stream, has to try to urinate frequently.   Gabriel Fernandez has Smoker; Other and unspecified hyperlipidemia; Gout; and Severe obesity (BMI >= 40) (HCC) on his problem list.  Gabriel Fernandez has a current medication list which includes the following prescription(s): atorvastatin, colchicine, metformin, tamsulosin, cyclobenzaprine, naproxen sodium, olopatadine, and tramadol. He has No Known Allergies.  Gabriel Fernandez  has a past medical history of Hyperlipidemia. Also  has no past surgical history on file.  His family history includes Cancer in his mother; Ovarian cancer in his mother.  Immunizations: Needs shingles vaccine.   ROS   Objective:   Vitals: BP 138/90 mmHg  Pulse 68  Temp(Src) 97.7 F (36.5 C) (Oral)  Resp 18  Wt 311 lb (141.069 kg)  SpO2 98%  Wt Readings from Last 3 Encounters:  09/29/15 311 lb (141.069 kg)  11/03/14 316 lb 6.4 oz (143.518 kg)  12/05/13 311 lb (141.069 kg)    BP Readings from Last 3 Encounters:  09/29/15  138/90  11/03/14 128/84  12/05/13 144/84   Physical Exam  Constitutional: He is oriented to person, place, and time. He appears well-developed and well-nourished.  Body habitus is obese.  HENT:  TM's intact bilaterally, no effusions or erythema. Nasal turbinates pink and moist, nasal passages patent. No sinus tenderness. Oropharynx clear, mucous membranes moist, dentition in good repair.  Eyes: Conjunctivae and EOM are normal. Pupils are equal, round, and reactive to light. Right eye exhibits no discharge. Left eye exhibits no discharge. No scleral icterus.  Neck: Normal range of motion. Neck supple. No thyromegaly present.  Cardiovascular: Normal rate, regular rhythm and intact distal pulses.  Exam reveals no gallop and no friction rub.   No murmur heard. Pulmonary/Chest: No stridor. No respiratory distress. He has no wheezes. He has no rales.  Abdominal: Soft. Bowel sounds are normal. He exhibits no distension and no mass. There is no tenderness.  Musculoskeletal: Normal range of motion. He exhibits no edema or tenderness.  Various superficial varicosities over both lower extremities.  Lymphadenopathy:    He has no cervical adenopathy.  Neurological: He is alert and oriented to person, place, and time. He has normal reflexes.  Skin: Skin is warm and dry. No rash noted. No erythema. No pallor.  Psychiatric: He has a normal mood and affect.   Results for orders placed or performed in visit on 09/29/15 (from the past 24 hour(s))  POCT glycosylated hemoglobin (Hb A1C)     Status: None   Collection Time: 09/29/15  5:31 PM  Result Value Ref Range   Hemoglobin A1C 6.6    Assessment and Plan :   1. Annual physical exam - Patient has several risk factors, recommended significant lifestyle modifications. - Discussed healthy lifestyle, diet, exercise, preventative care, vaccinations, and addressed patient's concerns.   2. Type 2 diabetes mellitus without complication, without long-term  current use of insulin (HCC) - Stable, continue Metformin once daily with food.  3. Hyperlipidemia - Lipid panel pending  4. BPH (benign prostatic hyperplasia) - PSA pending, refilled Flomax  5. Gout of left foot, unspecified cause, unspecified chronicity - Uric acid level pending  6. Tobacco use disorder - Counseled on smoking cessation. Patient is to start cutting back on cigarette use. He previously failed Chantix and is not really interested in starting another medication for this.  7. Alcohol abuse - Counseled patient on alcohol use. He was significantly mis-informed on the recommendations for daily alcohol use and agreed to cut back.   [name], PA-C [facility] [phone] 09/29/2015  4:54 PM

## 2015-09-29 NOTE — Patient Instructions (Addendum)
Keeping you healthy  Get these tests  Blood pressure- Have your blood pressure checked once a year by your healthcare provider.  Normal blood pressure is 120/80  Weight- Have your body mass index (BMI) calculated to screen for obesity.  BMI is a measure of body fat based on height and weight. You can also calculate your own BMI at [email or web address].  Cholesterol- Have your cholesterol checked every year.  Diabetes- Have your blood sugar checked regularly if you have high blood pressure, high cholesterol, have a family history of diabetes or if you are overweight.  Screening for Colon Cancer- Colonoscopy starting at age 86.  Screening may begin sooner depending on your family history and other health conditions. Follow up colonoscopy as directed by your Gastroenterologist.  Screening for Prostate Cancer- Both blood work (PSA) and a rectal exam help screen for Prostate Cancer.  Screening begins at age 47 with African-American men and at age 57 with Caucasian men.  Screening may begin sooner depending on your family history.  Take these medicines  Aspirin- One aspirin daily can help prevent Heart disease and Stroke.  Flu shot- Every fall.  Tetanus- Every 10 years.  Zostavax- Once after the age of 8 to prevent Shingles.  Pneumonia shot- Once after the age of 75; if you are younger than 52, ask your healthcare provider if you need a Pneumonia shot.  Take these steps  Don't smoke- If you do smoke, talk to your doctor about quitting.  For tips on how to quit, go to www.smokefree.gov or call 1-800-QUIT-NOW.  Be physically active- Exercise 5 days a week for at least 30 minutes.  If you are not already physically active start slow and gradually work up to 30 minutes of moderate physical activity.  Examples of moderate activity include walking briskly, mowing the yard, dancing, swimming, bicycling, etc.  Eat a healthy diet- Eat a variety of healthy food such as fruits, vegetables, low  fat milk, low fat cheese, yogurt, lean meant, poultry, fish, beans, tofu, etc. For more information go to www.thenutritionsource.org  Drink alcohol in moderation- Limit alcohol intake to less than two drinks a day. Never drink and drive.  Dentist- Brush and floss twice daily; visit your dentist twice a year.  Depression- Your emotional health is as important as your physical health. If you're feeling down, or losing interest in things you would normally enjoy please talk to your healthcare provider.  Eye exam- Visit your eye doctor every year.  Safe sex- If you may be exposed to a sexually transmitted infection, use a condom.  Seat belts- Seat belts can save your life; always wear one.  Smoke/Carbon Monoxide detectors- These detectors need to be installed on the appropriate level of your home.  Replace batteries at least once a year.  Skin cancer- When out in the sun, cover up and use sunscreen 15 SPF or higher.  Violence- If anyone is threatening you, please tell your healthcare provider.  Living Will/ Health care power of attorney- Speak with your healthcare provider and family.   Diabetes Mellitus and Food It is important for you to manage your blood sugar (glucose) level. Your blood glucose level can be greatly affected by what you eat. Eating healthier foods in the appropriate amounts throughout the day at about the same time each day will help you control your blood glucose level. It can also help slow or prevent worsening of your diabetes mellitus. Healthy eating may even help you improve the level of your blood  pressure and reach or maintain a healthy weight.  General recommendations for healthful eating and cooking habits include:  Eating meals and snacks regularly. Avoid going long periods of time without eating to lose weight.  Eating a diet that consists mainly of plant-based foods, such as fruits, vegetables, nuts, legumes, and whole grains.  Using low-heat cooking  methods, such as baking, instead of high-heat cooking methods, such as deep frying. Work with your dietitian to make sure you understand how to use the Nutrition Facts information on food labels. HOW CAN FOOD AFFECT ME? Carbohydrates Carbohydrates affect your blood glucose level more than any other type of food. Your dietitian will help you determine how many carbohydrates to eat at each meal and teach you how to count carbohydrates. Counting carbohydrates is important to keep your blood glucose at a healthy level, especially if you are using insulin or taking certain medicines for diabetes mellitus. Alcohol Alcohol can cause sudden decreases in blood glucose (hypoglycemia), especially if you use insulin or take certain medicines for diabetes mellitus. Hypoglycemia can be a life-threatening condition. Symptoms of hypoglycemia (sleepiness, dizziness, and disorientation) are similar to symptoms of having too much alcohol.  If your health care provider has given you approval to drink alcohol, do so in moderation and use the following guidelines:  Women should not have more than one drink per day, and men should not have more than two drinks per day. One drink is equal to:  12 oz of beer.  5 oz of wine.  1 oz of hard liquor.  Do not drink on an empty stomach.  Keep yourself hydrated. Have water, diet soda, or unsweetened iced tea.  Regular soda, juice, and other mixers might contain a lot of carbohydrates and should be counted. WHAT FOODS ARE NOT RECOMMENDED? As you make food choices, it is important to remember that all foods are not the same. Some foods have fewer nutrients per serving than other foods, even though they might have the same number of calories or carbohydrates. It is difficult to get your body what it needs when you eat foods with fewer nutrients. Examples of foods that you should avoid that are high in calories and carbohydrates but low in nutrients include:  Trans fats (most  processed foods list trans fats on the Nutrition Facts label).  Regular soda.  Juice.  Candy.  Sweets, such as cake, pie, doughnuts, and cookies.  Fried foods. WHAT FOODS CAN I EAT? Eat nutrient-rich foods, which will nourish your body and keep you healthy. The food you should eat also will depend on several factors, including:  The calories you need.  The medicines you take.  Your weight.  Your blood glucose level.  Your blood pressure level.  Your cholesterol level. You should eat a variety of foods, including:  Protein.  Lean cuts of meat.  Proteins low in saturated fats, such as fish, egg whites, and beans. Avoid processed meats.  Fruits and vegetables.  Fruits and vegetables that may help control blood glucose levels, such as apples, mangoes, and yams.  Dairy products.  Choose fat-free or low-fat dairy products, such as milk, yogurt, and cheese.  Grains, bread, pasta, and rice.  Choose whole grain products, such as multigrain bread, whole oats, and brown rice. These foods may help control blood pressure.  Fats.  Foods containing healthful fats, such as nuts, avocado, olive oil, canola oil, and fish. DOES EVERYONE WITH DIABETES MELLITUS HAVE THE SAME MEAL PLAN? Because every person with diabetes  mellitus is different, there is not one meal plan that works for everyone. It is very important that you meet with a dietitian who will help you create a meal plan that is just right for you.   This information is not intended to replace advice given to you by your health care provider. Make sure you discuss any questions you have with your health care provider.   Document Released: 02/17/2005 Document Revised: 06/13/2014 Document Reviewed: 04/19/2013 Elsevier Interactive Patient Education 2016 [employer].   Smoking Cessation, Tips for Success If you are ready to quit smoking, congratulations! You have chosen to help yourself be healthier. Cigarettes bring  nicotine, tar, carbon monoxide, and other irritants into your body. Your lungs, heart, and blood vessels will be able to work better without these poisons. There are many different ways to quit smoking. Nicotine gum, nicotine patches, a nicotine inhaler, or nicotine nasal spray can help with physical craving. Hypnosis, support groups, and medicines help break the habit of smoking. WHAT THINGS CAN I DO TO MAKE QUITTING EASIER?  Here are some tips to help you quit for good:  Pick a date when you will quit smoking completely. Tell all of your friends and family about your plan to quit on that date.  Do not try to slowly cut down on the number of cigarettes you are smoking. Pick a quit date and quit smoking completely starting on that day.  Throw away all cigarettes.   Clean and remove all ashtrays from your home, work, and car.  On a card, write down your reasons for quitting. Carry the card with you and read it when you get the urge to smoke.  Cleanse your body of nicotine. Drink enough water and fluids to keep your urine clear or pale yellow. Do this after quitting to flush the nicotine from your body.  Learn to predict your moods. Do not let a bad situation be your excuse to have a cigarette. Some situations in your life might tempt you into wanting a cigarette.  Never have "just one" cigarette. It leads to wanting another and another. Remind yourself of your decision to quit.  Change habits associated with smoking. If you smoked while driving or when feeling stressed, try other activities to replace smoking. Stand up when drinking your coffee. Brush your teeth after eating. Sit in a different chair when you read the paper. Avoid alcohol while trying to quit, and try to drink fewer caffeinated beverages. Alcohol and caffeine may urge you to smoke.  Avoid foods and drinks that can trigger a desire to smoke, such as sugary or spicy foods and alcohol.  Ask people who smoke not to smoke around  you.  Have something planned to do right after eating or having a cup of coffee. For example, plan to take a walk or exercise.  Try a relaxation exercise to calm you down and decrease your stress. Remember, you may be tense and nervous for the first 2 weeks after you quit, but this will pass.  Find new activities to keep your hands busy. Play with a pen, coin, or rubber band. Doodle or draw things on paper.  Brush your teeth right after eating. This will help cut down on the craving for the taste of tobacco after meals. You can also try mouthwash.   Use oral substitutes in place of cigarettes. Try using lemon drops, carrots, cinnamon sticks, or chewing gum. Keep them handy so they are available when you have the urge  to smoke.  When you have the urge to smoke, try deep breathing.  Designate your home as a nonsmoking area.  If you are a heavy smoker, ask your health care provider about a prescription for nicotine chewing gum. It can ease your withdrawal from nicotine.  Reward yourself. Set aside the cigarette money you save and buy yourself something nice.  Look for support from others. Join a support group or smoking cessation program. Ask someone at home or at work to help you with your plan to quit smoking.  Always ask yourself, "Do I need this cigarette or is this just a reflex?" Tell yourself, "Today, I choose not to smoke," or "I do not want to smoke." You are reminding yourself of your decision to quit.  Do not replace cigarette smoking with electronic cigarettes (commonly called e-cigarettes). The safety of e-cigarettes is unknown, and some may contain harmful chemicals.  If you relapse, do not give up! Plan ahead and think about what you will do the next time you get the urge to smoke. HOW WILL I FEEL WHEN I QUIT SMOKING? You may have symptoms of withdrawal because your body is used to nicotine (the addictive substance in cigarettes). You may crave cigarettes, be irritable, feel  very hungry, cough often, get headaches, or have difficulty concentrating. The withdrawal symptoms are only temporary. They are strongest when you first quit but will go away within 10-14 days. When withdrawal symptoms occur, stay in control. Think about your reasons for quitting. Remind yourself that these are signs that your body is healing and getting used to being without cigarettes. Remember that withdrawal symptoms are easier to treat than the major diseases that smoking can cause.  Even after the withdrawal is over, expect periodic urges to smoke. However, these cravings are generally short lived and will go away whether you smoke or not. Do not smoke! WHAT RESOURCES ARE AVAILABLE TO HELP ME QUIT SMOKING? Your health care provider can direct you to community resources or hospitals for support, which may include:  Group support.  Education.  Hypnosis.  Therapy.   This information is not intended to replace advice given to you by your health care provider. Make sure you discuss any questions you have with your health care provider.   Document Released: 02/19/2004 Document Revised: 06/13/2014 Document Reviewed: 11/08/2012 Elsevier Interactive Patient Education 2016 Elsevier Inc.   Peripheral Vascular Disease Peripheral vascular disease (PVD) is a disease of the blood vessels that are not part of your heart and brain. A simple term for PVD is poor circulation. In most cases, PVD narrows the blood vessels that carry blood from your heart to the rest of your body. This can result in a decreased supply of blood to your arms, legs, and internal organs, like your stomach or kidneys. However, it most often affects a person's lower legs and feet. There are two types of PVD.  Organic PVD. This is the more common type. It is caused by damage to the structure of blood vessels.  Functional PVD. This is caused by conditions that make blood vessels contract and tighten (spasm). Without treatment,  PVD tends to get worse over time. PVD can also lead to acute ischemic limb. This is when an arm or limb suddenly has trouble getting enough blood. This is a medical emergency. CAUSES Each type of PVD has many different causes. The most common cause of PVD is buildup of a fatty material (plaque) inside of your arteries (atherosclerosis). Small amounts of  plaque can break off from the walls of the blood vessels and become lodged in a smaller artery. This blocks blood flow and can cause acute ischemic limb. Other common causes of PVD include:  Blood clots that form inside of blood vessels.  Injuries to blood vessels.  Diseases that cause inflammation of blood vessels or cause blood vessel spasms.  Health behaviors and health history that increase your risk of developing PVD. RISK FACTORS  You may have a greater risk of PVD if you:  Have a family history of PVD.  Have certain medical conditions, including:  High cholesterol.  Diabetes.  High blood pressure (hypertension).  Coronary heart disease.  Past problems with blood clots.  Past injury, such as burns or a broken bone. These may have damaged blood vessels in your limbs.  Buerger disease. This is caused by inflamed blood vessels in your hands and feet.  Some forms of arthritis.  Rare birth defects that affect the arteries in your legs.  Use tobacco.  Do not get enough exercise.  Are obese.  Are age 61 or older. SIGNS AND SYMPTOMS  PVD may cause many different symptoms. Your symptoms depend on what part of your body is not getting enough blood. Some common signs and symptoms include:  Cramps in your lower legs. This may be a symptom of poor leg circulation (claudication).  Pain and weakness in your legs while you are physically active that goes away when you rest (intermittent claudication).  Leg pain when at rest.  Leg numbness, tingling, or weakness.  Coldness in a leg or foot, especially when compared with  the other leg.  Skin or hair changes. These can include:  Hair loss.  Shiny skin.  Pale or bluish skin.  Thick toenails.  Inability to get or maintain an erection (erectile dysfunction). People with PVD are more prone to developing ulcers and sores on their toes, feet, or legs. These may take longer than normal to heal. DIAGNOSIS Your health care provider may diagnose PVD from your signs and symptoms. The health care provider will also do a physical exam. You may have tests to find out what is causing your PVD and determine its severity. Tests may include:  Blood pressure recordings from your arms and legs and measurements of the strength of your pulses (pulse volume recordings).  Imaging studies using sound waves to take pictures of the blood flow through your blood vessels (Doppler ultrasound).  Injecting a dye into your blood vessels before having imaging studies using:  X-rays (angiogram or arteriogram).  Computer-generated X-rays (CT angiogram).  A powerful electromagnetic field and a computer (magnetic resonance angiogram or MRA). TREATMENT Treatment for PVD depends on the cause of your condition and the severity of your symptoms. It also depends on your age. Underlying causes need to be treated and controlled. These include long-lasting (chronic) conditions, such as diabetes, high cholesterol, and high blood pressure. You may need to first try making lifestyle changes and taking medicines. Surgery may be needed if these do not work. Lifestyle changes may include:  Quitting smoking.  Exercising regularly.  Following a low-fat, low-cholesterol diet. Medicines may include:  Blood thinners to prevent blood clots.  Medicines to improve blood flow.  Medicines to improve your blood cholesterol levels. Surgical procedures may include:  A procedure that uses an inflated balloon to open a blocked artery and improve blood flow (angioplasty).  A procedure to put in a tube  (stent) to keep a blocked artery open (stent  implant).  Surgery to reroute blood flow around a blocked artery (peripheral bypass surgery).  Surgery to remove dead tissue from an infected wound on the affected limb.  Amputation. This is surgical removal of the affected limb. This may be necessary in cases of acute ischemic limb that are not improved through medical or surgical treatments. HOME CARE INSTRUCTIONS  Take medicines only as directed by your health care provider.  Do not use any tobacco products, including cigarettes, chewing tobacco, or electronic cigarettes. If you need help quitting, ask your health care provider.  Lose weight if you are overweight, and maintain a healthy weight as directed by your health care provider.  Eat a diet that is low in fat and cholesterol. If you need help, ask your health care provider.  Exercise regularly. Ask your health care provider to suggest some good activities for you.  Use compression stockings or other mechanical devices as directed by your health care provider.  Take good care of your feet.  Wear comfortable shoes that fit well.  Check your feet often for any cuts or sores. SEEK MEDICAL CARE IF:  You have cramps in your legs while walking.  You have leg pain when you are at rest.  You have coldness in a leg or foot.  Your skin changes.  You have erectile dysfunction.  You have cuts or sores on your feet that are not healing. SEEK IMMEDIATE MEDICAL CARE IF:  Your arm or leg turns cold and blue.  Your arms or legs become red, warm, swollen, painful, or numb.  You have chest pain or trouble breathing.  You suddenly have weakness in your face, arm, or leg.  You become very confused or lose the ability to speak.  You suddenly have a very bad headache or lose your vision.   This information is not intended to replace advice given to you by your health care provider. Make sure you discuss any questions you have with  your health care provider.   Document Released: 06/30/2004 Document Revised: 06/13/2014 Document Reviewed: 10/31/2013 Elsevier Interactive Patient Education 2016 [employer].    IF you received an x-ray today, you will receive an invoice from [facility]. Please contact [facility] at [phone] with questions or concerns regarding your invoice.   IF you received labwork today, you will receive an invoice from [employer]. Please contact [facility] at [phone] with questions or concerns regarding your invoice.   Our billing staff will not be able to assist you with questions regarding bills from these companies.  You will be contacted with the lab results as soon as they are available. The fastest way to get your results is to activate your My Chart account. Instructions are located on the last page of this paperwork. If you have not heard from [place] regarding the results in 2 weeks, please contact this office.

## 2015-09-30 LAB — URIC ACID: Uric Acid, Serum: 7 mg/dL (ref 4.0–7.8)

## 2015-09-30 LAB — CBC
HCT: 43.8 % (ref 38.5–50.0)
Hemoglobin: 15.3 g/dL (ref 13.2–17.1)
MCH: 31.8 pg (ref 27.0–33.0)
MCHC: 34.9 g/dL (ref 32.0–36.0)
MCV: 91.1 fL (ref 80.0–100.0)
MPV: 10.2 fL (ref 7.5–12.5)
PLATELETS: 284 10*3/uL (ref 140–400)
RBC: 4.81 MIL/uL (ref 4.20–5.80)
RDW: 13.7 % (ref 11.0–15.0)
WBC: 8.1 10*3/uL (ref 3.8–10.8)

## 2015-09-30 LAB — COMPLETE METABOLIC PANEL WITH GFR
ALT: 23 U/L (ref 9–46)
AST: 15 U/L (ref 10–35)
Albumin: 3.9 g/dL (ref 3.6–5.1)
Alkaline Phosphatase: 67 U/L (ref 40–115)
BUN: 12 mg/dL (ref 7–25)
CALCIUM: 9.4 mg/dL (ref 8.6–10.3)
CHLORIDE: 104 mmol/L (ref 98–110)
CO2: 20 mmol/L (ref 20–31)
CREATININE: 0.74 mg/dL (ref 0.70–1.25)
GFR, Est African American: >89 mL/min (ref >=60)
GFR, Est Non African American: >89 mL/min (ref >=60)
GLUCOSE: 89 mg/dL (ref 65–99)
POTASSIUM: 4.5 mmol/L (ref 3.5–5.3)
SODIUM: 138 mmol/L (ref 135–146)
Total Bilirubin: 0.5 mg/dL (ref 0.2–1.2)
Total Protein: 7.2 g/dL (ref 6.1–8.1)

## 2015-09-30 LAB — LIPID PANEL
CHOL/HDL RATIO: 3.5 ratio (ref <=5.0)
CHOLESTEROL: 175 mg/dL (ref 125–200)
HDL: 50 mg/dL (ref >=40)
LDL Cholesterol: 80 mg/dL (ref <130)
Triglycerides: 223 mg/dL — ABNORMAL HIGH (ref <150)
VLDL: 45 mg/dL — ABNORMAL HIGH (ref <30)

## 2015-09-30 LAB — PSA: PSA: 0.2 ng/mL (ref <=4.00)

## 2015-09-30 LAB — TSH: TSH: 1.38 m[IU]/L (ref 0.40–4.50)

## 2015-10-01 ENCOUNTER — Encounter: Payer: Self-pay | Admitting: Urgent Care

## 2015-10-01 ENCOUNTER — Telehealth: Payer: Self-pay | Admitting: Urgent Care

## 2015-10-01 DIAGNOSIS — E785 Hyperlipidemia, unspecified: Secondary | ICD-10-CM

## 2015-10-01 MED ORDER — ATORVASTATIN CALCIUM 20 MG PO TABS: ORAL_TABLET | ORAL | Status: DC

## 2015-10-01 NOTE — Telephone Encounter (Signed)
I left voicemail for patient, also refilled atorvastatin. He needs to have this rechecked in 6 months. All other results were mailed out.

## 2015-10-30 ENCOUNTER — Encounter: Payer: Self-pay | Admitting: Urgent Care

## 2016-01-25 ENCOUNTER — Other Ambulatory Visit: Payer: Self-pay | Admitting: Family Medicine

## 2016-01-25 DIAGNOSIS — E119 Type 2 diabetes mellitus without complications: Secondary | ICD-10-CM

## 2016-04-01 ENCOUNTER — Ambulatory Visit (INDEPENDENT_AMBULATORY_CARE_PROVIDER_SITE_OTHER): Payer: Commercial Managed Care - HMO | Admitting: Urgent Care

## 2016-04-01 VITALS — BP 140/88 | HR 71 | Temp 98.2°F | Resp 20 | Ht 71.5 in | Wt 307.8 lb

## 2016-04-01 DIAGNOSIS — E119 Type 2 diabetes mellitus without complications: Secondary | ICD-10-CM

## 2016-04-01 DIAGNOSIS — E785 Hyperlipidemia, unspecified: Secondary | ICD-10-CM

## 2016-04-01 DIAGNOSIS — N400 Enlarged prostate without lower urinary tract symptoms: Secondary | ICD-10-CM | POA: Diagnosis not present

## 2016-04-01 DIAGNOSIS — Z1211 Encounter for screening for malignant neoplasm of colon: Secondary | ICD-10-CM

## 2016-04-01 DIAGNOSIS — M79604 Pain in right leg: Secondary | ICD-10-CM | POA: Diagnosis not present

## 2016-04-01 MED ORDER — TAMSULOSIN HCL 0.4 MG PO CAPS: 0.4000 mg | ORAL_CAPSULE | Freq: Every day | ORAL | 5 refills | Status: DC

## 2016-04-01 MED ORDER — ATORVASTATIN CALCIUM 20 MG PO TABS: ORAL_TABLET | ORAL | 3 refills | Status: DC

## 2016-04-01 MED ORDER — MELOXICAM 7.5 MG PO TABS: 7.5000 mg | ORAL_TABLET | Freq: Every day | ORAL | 0 refills | Status: DC

## 2016-04-01 MED ORDER — METFORMIN HCL 500 MG PO TABS: 500.0000 mg | ORAL_TABLET | Freq: Every day | ORAL | 3 refills | Status: DC

## 2016-04-01 NOTE — Progress Notes (Addendum)
By signing my name below, I, Gabriel Fernandez, attest that this documentation has been prepared under the direction and in the presence of Gabriel Fernandez, [place].  Electronically Signed: Verlee Fernandez, Medical Scribe. 04/01/16. 4:47 PM.  MRN: [id number] DOB: 1955-11-30  Subjective:   Gabriel Fernandez is a 60 y.o. male presenting for chief complaint of Medication Refill (Flomax; metformin; lipitor)  Diabetes - Managed with Metformin. Diet is not compliant. Reports numbness in his feet bilaterally when he sits for long periods, for instance traveling in a plane, has muscle aches in his legs bilaterally. Denies blurred vision, chest pain, shob, heart racing, n/v, abdominal pain. Tries to hydrate well. Smokes 10 cigarettes per day. Drinks ~6 pack per week.  HL -  Managed with Lipitor. Diet as above. Does not exercise.   BPH - Managed with Flomax. He did not get refills at his last visit. Has had weakened stream, feels like he has to urinate frequently.  Right leg pain - Reports several week history of right leg cramping, discomfort. Patient has sedentary work, lifestyle. Has tried Anaprox. Denies trauma, back pain, tingling. Denies storing large items, wallet in his right back pocket.  Current Meds  Medication Sig   atorvastatin (LIPITOR) 20 MG tablet TAKE 1 TABLET BY MOUTH EVERY DAY.   colchicine (COLCRYS) 0.6 MG tablet Take 1 tablet by mouth daily as needed for flare up. Dx code: 274.9. PATIENT NEEDS OFFICE VISIT FOR ADDITIONAL REFILLS   metFORMIN (GLUCOPHAGE) 500 MG tablet Take 1 tablet (500 mg total) by mouth daily with breakfast.   tamsulosin (FLOMAX) 0.4 MG CAPS capsule Take 1 capsule (0.4 mg total) by mouth daily after breakfast.   [name] has No Known Allergies.  Gabriel Fernandez  has a past medical history of Hyperlipidemia. Also  has no past surgical history on file.  Objective:   Vitals: BP 140/88 (BP Location: Left Arm, Patient Position: Sitting, Cuff Size: Large)    Pulse 71    Temp 98.2  F (36.8 C) (Oral)    Resp 20    Ht 5' 11.5" (1.816 m)    Wt (!) 307 lb 12.8 oz (139.6 kg)    SpO2 96%    BMI 42.33 kg/m   Wt Readings from Last 3 Encounters:  04/01/16 (!) 307 lb 12.8 oz (139.6 kg)  09/29/15 (!) 311 lb (141.1 kg)  11/03/14 (!) 316 lb 6.4 oz (143.5 kg)   HLD: Lab Results  Component Value Date   CHOL 175 09/29/2015   HDL 50 09/29/2015   LDLCALC 80 09/29/2015   TRIG 223 (H) 09/29/2015   CHOLHDL 3.5 09/29/2015   Lab Results  Component Value Date   ALT 23 09/29/2015   AST 15 09/29/2015   ALKPHOS 67 09/29/2015   BILITOT 0.5 09/29/2015   Diabetic Foot Form - Detailed   Diabetic Foot Exam - detailed Diabetic Foot exam was performed with the following findings:  Yes 04/01/2016  5:26 PM  Visual Foot Exam completed.:  Yes  Is there a history of foot ulcer?:  No Can the patient see the bottom of their feet?:  Yes (Comment: somewhat because of stiffness) Are the shoes appropriate in style and fit?:  No Is there swelling or and abnormal foot shape?:  Yes (Comment: swelling of both feet) Are the toenails long?:  No Are the toenails thick?:  No Do you have pain in calf while walking?:  Yes (Comment: both) Is there a claw toe deformity?:  No Is there elevated skin temparature?:  No Is there limited skin dorsiflexion?:  No Is there foot or ankle muscle weakness?:  No Are the toenails ingrown?:  No Normal Range of Motion:  Yes Pulse Foot Exam completed.:  Yes  Sensory Foot Exam Completed.:  Yes Swelling:  Yes Semmes-Weinstein Monofilament Test R Foot Test Control:  Pos L Foot Test Control:  Pos  R Site 1-Great Toe:  Pos L Site 1-Great Toe:  Pos  R Site 4:  Pos L Site 4:  Pos  R Site 5:  Pos L Site 5:  Pos       Physical Exam  Constitutional: He is oriented to person, place, and time. He appears well-developed and well-nourished.  HENT:  Mouth/Throat: Oropharynx is clear and moist.  Eyes: No scleral icterus.  Cardiovascular: Normal rate, regular rhythm and  intact distal pulses.  Exam reveals no gallop and no friction rub.   No murmur heard. Pulmonary/Chest: No respiratory distress. He has no wheezes. He has no rales.  Abdominal: Soft. Bowel sounds are normal. He exhibits no distension and no mass. There is no tenderness. There is no guarding.  Musculoskeletal:       Lumbar back: He exhibits normal range of motion, no tenderness, no bony tenderness, no swelling, no edema, no deformity and no spasm.       Right upper leg: He exhibits no tenderness, no bony tenderness, no swelling, no edema and no deformity.       Right lower leg: He exhibits no tenderness, no bony tenderness, no swelling and no edema.  Negative SLR.   Neurological: He is alert and oriented to person, place, and time.  Skin: Skin is warm and dry.  Multiple superficial varicosities in both distal lower extremities.   Assessment and Plan :   1. Hyperlipidemia, unspecified hyperlipidemia type - Refilled statin, consider this as a source of his right leg pain, discussed with patient. Consider switching if conservative management fails as above.  2. Type 2 diabetes mellitus without complication, without long-term current use of insulin ([place]) - Well controlled, refilled Metformin. Recommended dietary modifications. Recheck in 6 months.  3. Benign prostatic hyperplasia, unspecified whether lower urinary tract symptoms present - Refilled Flomax.  4. Right leg pain - CK labs pending, counseled on lifestyle modifications. Use APAP, meloxicam, hydrate well. Consider PT, imaging if no improvement in 4 weeks.  5. Colon cancer screening - Ambulatory referral to Gastroenterology   Gabriel Eagles, PA-C [facility] [phone] 04/01/2016 4:47 PM

## 2016-04-01 NOTE — Patient Instructions (Addendum)
Diabetes Mellitus and Food It is important for you to manage your blood sugar (glucose) level. Your blood glucose level can be greatly affected by what you eat. Eating healthier foods in the appropriate amounts throughout the day at about the same time each day will help you control your blood glucose level. It can also help slow or prevent worsening of your diabetes mellitus. Healthy eating may even help you improve the level of your blood pressure and reach or maintain a healthy weight.  General recommendations for healthful eating and cooking habits include:  Eating meals and snacks regularly. Avoid going long periods of time without eating to lose weight.  Eating a diet that consists mainly of plant-based foods, such as fruits, vegetables, nuts, legumes, and whole grains.  Using low-heat cooking methods, such as baking, instead of high-heat cooking methods, such as deep frying. Work with your dietitian to make sure you understand how to use the Nutrition Facts information on food labels. HOW CAN FOOD AFFECT ME? Carbohydrates Carbohydrates affect your blood glucose level more than any other type of food. Your dietitian will help you determine how many carbohydrates to eat at each meal and teach you how to count carbohydrates. Counting carbohydrates is important to keep your blood glucose at a healthy level, especially if you are using insulin or taking certain medicines for diabetes mellitus. Alcohol Alcohol can cause sudden decreases in blood glucose (hypoglycemia), especially if you use insulin or take certain medicines for diabetes mellitus. Hypoglycemia can be a life-threatening condition. Symptoms of hypoglycemia (sleepiness, dizziness, and disorientation) are similar to symptoms of having too much alcohol.  If your health care provider has given you approval to drink alcohol, do so in moderation and use the following guidelines:  Women should not have more than one drink per day, and men  should not have more than two drinks per day. One drink is equal to:  12 oz of beer.  5 oz of wine.  1 oz of hard liquor.  Do not drink on an empty stomach.  Keep yourself hydrated. Have water, diet soda, or unsweetened iced tea.  Regular soda, juice, and other mixers might contain a lot of carbohydrates and should be counted. WHAT FOODS ARE NOT RECOMMENDED? As you make food choices, it is important to remember that all foods are not the same. Some foods have fewer nutrients per serving than other foods, even though they might have the same number of calories or carbohydrates. It is difficult to get your body what it needs when you eat foods with fewer nutrients. Examples of foods that you should avoid that are high in calories and carbohydrates but low in nutrients include:  Trans fats (most processed foods list trans fats on the Nutrition Facts label).  Regular soda.  Juice.  Candy.  Sweets, such as cake, pie, doughnuts, and cookies.  Fried foods. WHAT FOODS CAN I EAT? Eat nutrient-rich foods, which will nourish your body and keep you healthy. The food you should eat also will depend on several factors, including:  The calories you need.  The medicines you take.  Your weight.  Your blood glucose level.  Your blood pressure level.  Your cholesterol level. You should eat a variety of foods, including:  Protein.  Lean cuts of meat.  Proteins low in saturated fats, such as fish, egg whites, and beans. Avoid processed meats.  Fruits and vegetables.  Fruits and vegetables that may help control blood glucose levels, such as apples, mangoes, and   yams.  Dairy products.  Choose fat-free or low-fat dairy products, such as milk, yogurt, and cheese.  Grains, bread, pasta, and rice.  Choose whole grain products, such as multigrain bread, whole oats, and brown rice. These foods may help control blood pressure.  Fats.  Foods containing healthful fats, such as nuts,  avocado, olive oil, canola oil, and fish. DOES EVERYONE WITH DIABETES MELLITUS HAVE THE SAME MEAL PLAN? Because every person with diabetes mellitus is different, there is not one meal plan that works for everyone. It is very important that you meet with a dietitian who will help you create a meal plan that is just right for you.   This information is not intended to replace advice given to you by your health care provider. Make sure you discuss any questions you have with your health care provider.   Document Released: 02/17/2005 Document Revised: 06/13/2014 Document Reviewed: 04/19/2013 Elsevier Interactive Patient Education 2016 [employer].      IF you received an x-ray today, you will receive an invoice from [facility]. Please contact [facility] at [phone] with questions or concerns regarding your invoice.   IF you received labwork today, you will receive an invoice from [employer]. Please contact [facility] at [phone] with questions or concerns regarding your invoice.   Our billing staff will not be able to assist you with questions regarding bills from these companies.  You will be contacted with the lab results as soon as they are available. The fastest way to get your results is to activate your My Chart account. Instructions are located on the last page of this paperwork. If you have not heard from [place] regarding the results in 2 weeks, please contact this office.

## 2016-04-02 LAB — LIPID PANEL
Cholesterol: 160 mg/dL (ref 125–200)
HDL: 49 mg/dL (ref >=40)
LDL CALC: 83 mg/dL (ref <130)
TRIGLYCERIDES: 138 mg/dL (ref <150)
Total CHOL/HDL Ratio: 3.3 Ratio (ref <=5.0)
VLDL: 28 mg/dL (ref <30)

## 2016-04-02 LAB — COMPLETE METABOLIC PANEL WITH GFR
ALT: 26 U/L (ref 9–46)
AST: 23 U/L (ref 10–35)
Albumin: 4.4 g/dL (ref 3.6–5.1)
Alkaline Phosphatase: 65 U/L (ref 40–115)
BILIRUBIN TOTAL: 0.5 mg/dL (ref 0.2–1.2)
BUN: 14 mg/dL (ref 7–25)
CHLORIDE: 103 mmol/L (ref 98–110)
CO2: 23 mmol/L (ref 20–31)
Calcium: 10.1 mg/dL (ref 8.6–10.3)
Creat: 0.89 mg/dL (ref 0.70–1.25)
Glucose, Bld: 103 mg/dL — ABNORMAL HIGH (ref 65–99)
Potassium: 4.8 mmol/L (ref 3.5–5.3)
Sodium: 138 mmol/L (ref 135–146)
TOTAL PROTEIN: 7.4 g/dL (ref 6.1–8.1)

## 2016-04-02 LAB — HEMOGLOBIN A1C
HEMOGLOBIN A1C: 6.2 % — AB (ref <5.7)
MEAN PLASMA GLUCOSE: 131 mg/dL

## 2016-04-06 LAB — CK: Total CK: 99 U/L (ref 7–232)

## 2016-05-01 ENCOUNTER — Other Ambulatory Visit: Payer: Self-pay | Admitting: Urgent Care

## 2016-05-03 NOTE — Telephone Encounter (Signed)
04/01/16 last ov and meds

## 2016-05-04 NOTE — Telephone Encounter (Signed)
Refilled for 2 weeks.  Return to clinic if symptoms have not improve.

## 2016-05-05 ENCOUNTER — Encounter: Payer: Self-pay | Admitting: Urgent Care

## 2016-07-13 ENCOUNTER — Ambulatory Visit (INDEPENDENT_AMBULATORY_CARE_PROVIDER_SITE_OTHER): Payer: Commercial Managed Care - HMO | Admitting: Emergency Medicine

## 2016-07-13 VITALS — BP 150/82 | HR 70 | Temp 97.7°F | Resp 17 | Ht 71.5 in | Wt 311.0 lb

## 2016-07-13 DIAGNOSIS — M25561 Pain in right knee: Secondary | ICD-10-CM | POA: Diagnosis not present

## 2016-07-13 DIAGNOSIS — Z8739 Personal history of other diseases of the musculoskeletal system and connective tissue: Secondary | ICD-10-CM

## 2016-07-13 DIAGNOSIS — L723 Sebaceous cyst: Secondary | ICD-10-CM

## 2016-07-13 DIAGNOSIS — Z0000 Encounter for general adult medical examination without abnormal findings: Secondary | ICD-10-CM

## 2016-07-13 MED ORDER — DICLOFENAC SODIUM 75 MG PO TBEC: 75.0000 mg | DELAYED_RELEASE_TABLET | Freq: Two times a day (BID) | ORAL | 0 refills | Status: AC

## 2016-07-13 NOTE — Progress Notes (Signed)
Gabriel BARRACLOUGH 61 y.o.   Chief Complaint  Patient presents with  . Knee Pain    RT onset yesterday, driving for an hour and 45 minutes yesterday    HISTORY OF PRESENT ILLNESS: This is a 61 y.o. male complaining of right knee pain that started yesterday. Also mentioned lump in the left earlobe, fingers locking up every now and then, and needs colonoscopy referral.  Knee Pain   The incident occurred 12 to 24 hours ago. There was no injury mechanism. The pain is present in the right knee. The quality of the pain is described as aching. The pain is at a severity of 5/10. The pain is moderate. The pain has been constant since onset. Pertinent negatives include no loss of sensation, muscle weakness, numbness or tingling. The symptoms are aggravated by weight bearing, palpation and movement. He has tried nothing for the symptoms.     Prior to Admission medications   Medication Sig Start Date End Date Taking? Authorizing Provider  atorvastatin (LIPITOR) 20 MG tablet TAKE 1 TABLET BY MOUTH EVERY DAY. 04/01/16  Yes [name], PA-C  colchicine (COLCRYS) 0.6 MG tablet Take 1 tablet by mouth daily as needed for flare up. Dx code: 274.9. PATIENT NEEDS OFFICE VISIT FOR ADDITIONAL REFILLS 11/15/13  Yes Collene Leyden, PA-C  metFORMIN (GLUCOPHAGE) 500 MG tablet Take 1 tablet (500 mg total) by mouth daily with breakfast. 04/01/16  Yes [name], PA-C  tamsulosin (FLOMAX) 0.4 MG CAPS capsule Take 1 capsule (0.4 mg total) by mouth daily after breakfast. 04/01/16  Yes [name], PA-C  Zoster Vaccine Live, PF, (ZOSTAVAX) 60454 UNT/0.65ML injection Inject 19,400 Units into the skin once. 09/29/15  Yes [name], PA-C    No Known Allergies  Patient Active Problem List   Diagnosis Date Noted  . Smoker 06/08/2013  . Other and unspecified hyperlipidemia 06/08/2013  . Gout 06/08/2013  . Severe obesity (BMI >= 40) ([place]) 06/08/2013    Past Medical History:  Diagnosis Date  . Hyperlipidemia     No  past surgical history on file.  Social History   Social History  . Marital status: Single    Spouse name: N/A  . Number of children: N/A  . Years of education: N/A   Occupational History  . Not on file.   Social History Main Topics  . Smoking status: Current Every Day Smoker    Packs/day: 1.00    Years: 35.00  . Smokeless tobacco: Never Used  . Alcohol use 0.6 oz/week    1 Glasses of wine per week  . Drug use: No  . Sexual activity: Not on file   Other Topics Concern  . Not on file   Social History Narrative  . No narrative on file    Family History  Problem Relation Age of Onset  . Cancer Mother   . Ovarian cancer Mother      Review of Systems  Constitutional: Negative for chills and fever.  Respiratory: Negative for cough, hemoptysis, shortness of breath and wheezing.   Cardiovascular: Negative for chest pain, palpitations, orthopnea, leg swelling and PND.  Gastrointestinal: Negative for abdominal pain, nausea and vomiting.  Genitourinary: Negative for dysuria and hematuria.  Musculoskeletal: Positive for joint pain (right knee).  Skin: Negative for rash.  Neurological: Negative for dizziness, tingling, sensory change, focal weakness, numbness and headaches.  All other systems reviewed and are negative.  Vitals:   07/13/16 1527  BP: (!) 150/82  Pulse: 70  Resp: 17  Temp: 97.7 F (36.5 C)     Physical Exam  Constitutional: He is oriented to person, place, and time. He appears well-developed and well-nourished.  HENT:  Head: Normocephalic and atraumatic.  Left earlobe sebaceous cyst posteriorly.  Eyes: Conjunctivae and EOM are normal. Pupils are equal, round, and reactive to light.  Neck: Normal range of motion. Neck supple. No JVD present.  Cardiovascular: Normal rate and regular rhythm.   Pulmonary/Chest: Effort normal.  Abdominal: Soft.  Musculoskeletal:  Right knee: +lateral tenderness; stable with FROM; no signs of DVT to either leg    Lymphadenopathy:    He has no cervical adenopathy.  Neurological: He is alert and oriented to person, place, and time. No sensory deficit. He exhibits normal muscle tone.  Skin: Skin is warm and dry. Capillary refill takes less than 2 seconds.  Psychiatric: He has a normal mood and affect. His behavior is normal.  Vitals reviewed.    ASSESSMENT & PLAN: Galyn was seen today for knee pain.  Diagnoses and all orders for this visit:  Acute pain of right knee -     US Venous Img Lower Unilateral Right; Future  History of trigger finger -     Ambulatory referral to Hand Surgery  Sebaceous cyst Comments: left earlobe  Healthcare maintenance -     Ambulatory referral to Gastroenterology  Other orders -     diclofenac (VOLTAREN) 75 MG EC tablet; Take 1 tablet (75 mg total) by mouth 2 (two) times daily.    Patient Instructions       IF you received an x-ray today, you will receive an invoice from [facility]. Please contact [facility] at [phone] with questions or concerns regarding your invoice.   IF you received labwork today, you will receive an invoice from [facility]. Please contact [facility] at [phone] with questions or concerns regarding your invoice.   Our billing staff will not be able to assist you with questions regarding bills from these companies.  You will be contacted with the lab results as soon as they are available. The fastest way to get your results is to activate your My Chart account. Instructions are located on the last page of this paperwork. If you have not heard from [place] regarding the results in 2 weeks, please contact this office.     Knee Pain Knee pain is a very common symptom and can have many causes. Knee pain often goes away when you follow your health care provider's instructions for relieving pain and discomfort at home. However, knee pain can develop into a condition that needs treatment. Some conditions may  include:  Arthritis caused by wear and tear (osteoarthritis).  Arthritis caused by swelling and irritation (rheumatoid arthritis or gout).  A cyst or growth in your knee.  An infection in your knee joint.  An injury that will not heal.  Damage, swelling, or irritation of the tissues that support your knee (torn ligaments or tendinitis). If your knee pain continues, additional tests may be ordered to diagnose your condition. Tests may include X-rays or other imaging studies of your knee. You may also need to have fluid removed from your knee. Treatment for ongoing knee pain depends on the cause, but treatment may include:  Medicines to relieve pain or swelling.  Steroid injections in your knee.  Physical therapy.  Surgery. HOME CARE INSTRUCTIONS  Take medicines only as directed by your health care provider.  Rest your knee and keep it raised (elevated) while you are  resting.  Do not do things that cause or worsen pain.  Avoid high-impact activities or exercises, such as running, jumping rope, or doing jumping jacks.  Apply ice to the knee area:  Put ice in a plastic bag.  Place a towel between your skin and the bag.  Leave the ice on for 20 minutes, 2-3 times a day.  Ask your health care provider if you should wear an elastic knee support.  Keep a pillow under your knee when you sleep.  Lose weight if you are overweight. Extra weight can put pressure on your knee.  Do not use any tobacco products, including cigarettes, chewing tobacco, or electronic cigarettes. If you need help quitting, ask your health care provider. Smoking may slow the healing of any bone and joint problems that you may have. SEEK MEDICAL CARE IF:  Your knee pain continues, changes, or gets worse.  You have a fever along with knee pain.  Your knee buckles or locks up.  Your knee becomes more swollen. SEEK IMMEDIATE MEDICAL CARE IF:   Your knee joint feels hot to the touch.  You have chest  pain or trouble breathing. This information is not intended to replace advice given to you by your health care provider. Make sure you discuss any questions you have with your health care provider. Document Released: 03/20/2007 Document Revised: 06/13/2014 Document Reviewed: 01/06/2014 Elsevier Interactive Patient Education  2017 Elsevier Inc.      Agustina Caroli, MD [facility]

## 2016-07-13 NOTE — Patient Instructions (Addendum)
     IF you received an x-ray today, you will receive an invoice from [facility]. Please contact [facility] at [phone] with questions or concerns regarding your invoice.   IF you received labwork today, you will receive an invoice from [place]. Please contact [facility] at [phone] with questions or concerns regarding your invoice.   Our billing staff will not be able to assist you with questions regarding bills from these companies.  You will be contacted with the lab results as soon as they are available. The fastest way to get your results is to activate your My Chart account. Instructions are located on the last page of this paperwork. If you have not heard from [place] regarding the results in 2 weeks, please contact this office.     Knee Pain Knee pain is a very common symptom and can have many causes. Knee pain often goes away when you follow your health care provider's instructions for relieving pain and discomfort at home. However, knee pain can develop into a condition that needs treatment. Some conditions may include:  Arthritis caused by wear and tear (osteoarthritis).  Arthritis caused by swelling and irritation (rheumatoid arthritis or gout).  A cyst or growth in your knee.  An infection in your knee joint.  An injury that will not heal.  Damage, swelling, or irritation of the tissues that support your knee (torn ligaments or tendinitis). If your knee pain continues, additional tests may be ordered to diagnose your condition. Tests may include X-rays or other imaging studies of your knee. You may also need to have fluid removed from your knee. Treatment for ongoing knee pain depends on the cause, but treatment may include:  Medicines to relieve pain or swelling.  Steroid injections in your knee.  Physical therapy.  Surgery. HOME CARE INSTRUCTIONS  Take medicines only as directed by your health care provider.  Rest your knee and keep it  raised (elevated) while you are resting.  Do not do things that cause or worsen pain.  Avoid high-impact activities or exercises, such as running, jumping rope, or doing jumping jacks.  Apply ice to the knee area:  Put ice in a plastic bag.  Place a towel between your skin and the bag.  Leave the ice on for 20 minutes, 2-3 times a day.  Ask your health care provider if you should wear an elastic knee support.  Keep a pillow under your knee when you sleep.  Lose weight if you are overweight. Extra weight can put pressure on your knee.  Do not use any tobacco products, including cigarettes, chewing tobacco, or electronic cigarettes. If you need help quitting, ask your health care provider. Smoking may slow the healing of any bone and joint problems that you may have. SEEK MEDICAL CARE IF:  Your knee pain continues, changes, or gets worse.  You have a fever along with knee pain.  Your knee buckles or locks up.  Your knee becomes more swollen. SEEK IMMEDIATE MEDICAL CARE IF:   Your knee joint feels hot to the touch.  You have chest pain or trouble breathing. This information is not intended to replace advice given to you by your health care provider. Make sure you discuss any questions you have with your health care provider. Document Released: 03/20/2007 Document Revised: 06/13/2014 Document Reviewed: 01/06/2014 Elsevier Interactive Patient Education  2017 [employer].

## 2016-07-14 ENCOUNTER — Encounter: Payer: Self-pay | Admitting: Gastroenterology

## 2016-07-15 ENCOUNTER — Encounter (HOSPITAL_COMMUNITY): Payer: Self-pay

## 2016-07-15 ENCOUNTER — Ambulatory Visit (HOSPITAL_COMMUNITY)
Admission: RE | Admit: 2016-07-15 | Discharge: 2016-07-15 | Disposition: A | Discharge status: Home / Self Care | Payer: Commercial Managed Care - HMO | Source: Ambulatory Visit | Attending: Emergency Medicine | Admitting: Emergency Medicine

## 2016-07-15 ENCOUNTER — Telehealth: Payer: Self-pay | Admitting: Emergency Medicine

## 2016-07-15 DIAGNOSIS — M25561 Pain in right knee: Secondary | ICD-10-CM | POA: Insufficient documentation

## 2016-07-15 NOTE — Telephone Encounter (Signed)
Left message on VM scheduled ultrasound doppler @ [facility] at 10am

## 2016-07-15 NOTE — Progress Notes (Signed)
VASCULAR LAB PRELIMINARY  PRELIMINARY  PRELIMINARY  PRELIMINARY  Right lower extremity venous duplex completed.    Preliminary report:  Right:  No evidence of DVT, superficial thrombosis, or Baker's cyst.  Conal Shetley, RVS 07/15/2016, 4:44 PM

## 2016-07-21 ENCOUNTER — Telehealth: Payer: Self-pay | Admitting: Emergency Medicine

## 2016-07-21 ENCOUNTER — Encounter: Payer: Self-pay | Admitting: Emergency Medicine

## 2016-07-21 NOTE — Telephone Encounter (Signed)
-----   Message from [facility], MD sent at 07/16/2016  9:30 AM EST ----- Normal results. Call patient and inform about results.

## 2016-09-05 ENCOUNTER — Ambulatory Visit (AMBULATORY_SURGERY_CENTER): Payer: Self-pay

## 2016-09-05 VITALS — Ht 72.0 in | Wt 315.2 lb

## 2016-09-05 DIAGNOSIS — Z1211 Encounter for screening for malignant neoplasm of colon: Secondary | ICD-10-CM

## 2016-09-05 MED ORDER — NA SULFATE-K SULFATE-MG SULF 17.5-3.13-1.6 GM/177ML PO SOLN: 1.0000 | Freq: Once | ORAL | 0 refills | Status: AC

## 2016-09-05 NOTE — Progress Notes (Signed)
Denies allergies to eggs or soy products. Denies complication of anesthesia or sedation. Denies use of weight loss medication. Denies use of O2.   Emmi instructions declined.  

## 2016-09-06 ENCOUNTER — Encounter: Payer: Self-pay | Admitting: Gastroenterology

## 2016-09-19 ENCOUNTER — Encounter: Payer: Self-pay | Admitting: Gastroenterology

## 2016-09-19 ENCOUNTER — Ambulatory Visit (AMBULATORY_SURGERY_CENTER): Payer: Commercial Managed Care - HMO | Admitting: Gastroenterology

## 2016-09-19 VITALS — BP 123/58 | HR 54 | Temp 96.8°F | Resp 11 | Ht 72.0 in | Wt 315.0 lb

## 2016-09-19 DIAGNOSIS — Z1212 Encounter for screening for malignant neoplasm of rectum: Secondary | ICD-10-CM | POA: Diagnosis not present

## 2016-09-19 DIAGNOSIS — D127 Benign neoplasm of rectosigmoid junction: Secondary | ICD-10-CM | POA: Diagnosis not present

## 2016-09-19 DIAGNOSIS — D120 Benign neoplasm of cecum: Secondary | ICD-10-CM

## 2016-09-19 DIAGNOSIS — K621 Rectal polyp: Secondary | ICD-10-CM | POA: Diagnosis not present

## 2016-09-19 DIAGNOSIS — Z1211 Encounter for screening for malignant neoplasm of colon: Secondary | ICD-10-CM | POA: Diagnosis not present

## 2016-09-19 DIAGNOSIS — D129 Benign neoplasm of anus and anal canal: Secondary | ICD-10-CM

## 2016-09-19 DIAGNOSIS — D128 Benign neoplasm of rectum: Secondary | ICD-10-CM

## 2016-09-19 DIAGNOSIS — D122 Benign neoplasm of ascending colon: Secondary | ICD-10-CM

## 2016-09-19 DIAGNOSIS — D124 Benign neoplasm of descending colon: Secondary | ICD-10-CM

## 2016-09-19 MED ORDER — SODIUM CHLORIDE 0.9 % IV SOLN: 500.0000 mL | INTRAVENOUS | Status: DC

## 2016-09-19 NOTE — Progress Notes (Signed)
Called to room to assist during endoscopic procedure.  Patient ID and intended procedure confirmed with present staff. Received instructions for my participation in the procedure from the performing physician.  

## 2016-09-19 NOTE — Op Note (Signed)
[place] Patient Name: Gabriel Fernandez Procedure Date: 09/19/2016 11:21 AM MRN: [id number] Endoscopist: Remo Lipps P. Armbruster MD, MD Age: 61 Referring MD:  Date of Birth: September 08, 1955 Gender: Male Account #: [id number] Procedure:                Colonoscopy Indications:              Screening for colorectal malignant neoplasm Medicines:                Monitored Anesthesia Care Procedure:                Pre-Anesthesia Assessment:                           - Prior to the procedure, a History and Physical                            was performed, and patient medications and                            allergies were reviewed. The patient's tolerance of                            previous anesthesia was also reviewed. The risks                            and benefits of the procedure and the sedation                            options and risks were discussed with the patient.                            All questions were answered, and informed consent                            was obtained. Prior Anticoagulants: The patient has                            taken no previous anticoagulant or antiplatelet                            agents. ASA Grade Assessment: III - A patient with                            severe systemic disease. After reviewing the risks                            and benefits, the patient was deemed in                            satisfactory condition to undergo the procedure.                           After obtaining informed consent, the colonoscope  was passed under direct vision. Throughout the                            procedure, the patient's blood pressure, pulse, and                            oxygen saturations were monitored continuously. The                            Colonoscope was introduced through the anus and                            advanced to the the cecum, identified by                            appendiceal  orifice and ileocecal valve. The                            colonoscopy was performed without difficulty. The                            patient tolerated the procedure well. The quality                            of the bowel preparation was good. The ileocecal                            valve, appendiceal orifice, and rectum were                            photographed. Scope In: 11:25:31 AM Scope Out: 11:48:36 AM Scope Withdrawal Time: 0 hours 20 minutes 55 seconds  Total Procedure Duration: 0 hours 23 minutes 5 seconds  Findings:                 The perianal and digital rectal examinations were                            normal.                           Two sessile polyps were found in the cecum. The                            polyps were 4 to 6 mm in size. These polyps were                            removed with a cold snare. Resection and retrieval                            were complete.                           A 7 mm polyp was found in the ascending colon. The  polyp was sessile. The polyp was removed with a                            cold snare. Resection and retrieval were complete.                           A 4 mm polyp was found in the descending colon. The                            polyp was sessile. The polyp was removed with a                            cold snare. Resection and retrieval were complete.                           A 6 mm polyp was found in the recto-sigmoid colon.                            The polyp was semi-pedunculated. The polyp was                            removed with a hot snare. Resection and retrieval                            were complete.                           Three semi-pedunculated polyps were found in the                            rectum. The polyps were 4 to 5 mm in size. These                            polyps were removed with a hot snare. Resection and                            retrieval were  complete.                           Multiple medium-mouthed diverticula were found in                            the left colon.                           Internal hemorrhoids were found during retroflexion.                           The exam was otherwise without abnormality. Complications:            No immediate complications. Estimated blood loss:                            Minimal. Estimated Blood Loss:  Estimated blood loss was minimal. Impression:               - Two 4 to 6 mm polyps in the cecum, removed with a                            cold snare. Resected and retrieved.                           - One 7 mm polyp in the ascending colon, removed                            with a cold snare. Resected and retrieved.                           - One 4 mm polyp in the descending colon, removed                            with a cold snare. Resected and retrieved.                           - One 6 mm polyp at the recto-sigmoid colon,                            removed with a hot snare. Resected and retrieved.                           - Three 4 to 5 mm polyps in the rectum, removed                            with a hot snare. Resected and retrieved.                           - Diverticulosis in the left colon.                           - Internal hemorrhoids.                           - The examination was otherwise normal. Recommendation:           - Patient has a contact number available for                            emergencies. The signs and symptoms of potential                            delayed complications were discussed with the                            patient. Return to normal activities tomorrow.                            Written discharge instructions were provided to the  patient.                           - Resume previous diet.                           - Continue present medications.                           - Repeat colonoscopy date  to be determined after                            pending pathology results are reviewed for                            surveillance.                           - No ibuprofen, naproxen, or other non-steroidal                            anti-inflammatory drugs for 2 weeks after polyp                            removal. Remo Lipps P. Armbruster MD, MD 09/19/2016 11:54:44 AM This report has been signed electronically.

## 2016-09-19 NOTE — Patient Instructions (Signed)
YOU HAD AN ENDOSCOPIC PROCEDURE TODAY AT [facility]:   Refer to the procedure report that was given to you for any specific questions about what was found during the examination.  If the procedure report does not answer your questions, please call your gastroenterologist to clarify.  If you requested that your care partner not be given the details of your procedure findings, then the procedure report has been included in a sealed envelope for you to review at your convenience later.  YOU SHOULD EXPECT: Some feelings of bloating in the abdomen. Passage of more gas than usual.  Walking can help get rid of the air that was put into your GI tract during the procedure and reduce the bloating. If you had a lower endoscopy (such as a colonoscopy or flexible sigmoidoscopy) you may notice spotting of blood in your stool or on the toilet paper. If you underwent a bowel prep for your procedure, you may not have a normal bowel movement for a few days.  Please Note:  You might notice some irritation and congestion in your nose or some drainage.  This is from the oxygen used during your procedure.  There is no need for concern and it should clear up in a day or so.  SYMPTOMS TO REPORT IMMEDIATELY:   Following lower endoscopy (colonoscopy or flexible sigmoidoscopy):  Excessive amounts of blood in the stool  Significant tenderness or worsening of abdominal pains  Swelling of the abdomen that is new, acute  Fever of 100F or higher  For urgent or emergent issues, a gastroenterologist can be reached at any hour by calling [phone].   DIET:  We do recommend a small meal at first, but then you may proceed to your regular diet.  Drink plenty of fluids but you should avoid alcoholic beverages for 24 hours.  ACTIVITY:  You should plan to take it easy for the rest of today and you should NOT DRIVE or use heavy machinery until tomorrow (because of the sedation medicines used during the test).     FOLLOW UP: Our staff will call the number listed on your records the next business day following your procedure to check on you and address any questions or concerns that you may have regarding the information given to you following your procedure. If we do not reach you, we will leave a message.  However, if you are feeling well and you are not experiencing any problems, there is no need to return our call.  We will assume that you have returned to your regular daily activities without incident.  If any biopsies were taken you will be contacted by phone or by letter within the next 1-3 weeks.  Please call us at [phone] if you have not heard about the biopsies in 3 weeks.    Await biopsy results to determined next repeat Colonoscopy screening Polyps (handout given) Diverticulosis (handout given) No Ibuprofen, Naproxen, or other non-steriodal anti-inflammatory drugs for two weeks  SIGNATURES/CONFIDENTIALITY: You and/or your care partner have signed paperwork which will be entered into your electronic medical record.  These signatures attest to the fact that that the information above on your After Visit Summary has been reviewed and is understood.  Full responsibility of the confidentiality of this discharge information lies with you and/or your care-partner.

## 2016-09-19 NOTE — Progress Notes (Signed)
To PACU VSS, Report to RN 

## 2016-09-20 ENCOUNTER — Telehealth: Payer: Self-pay

## 2016-09-20 NOTE — Telephone Encounter (Signed)
  Follow up Call-  Call back number 09/19/2016  Post procedure Call Back phone  # [phone]  Permission to leave phone message Yes  Some recent data might be hidden     Patient questions:  Do you have a fever, pain , or abdominal swelling? No. Pain Score  0 *  Have you tolerated food without any problems? Yes.    Have you been able to return to your normal activities? Yes.    Do you have any questions about your discharge instructions: Diet   No. Medications  No. Follow up visit  No.  Do you have questions or concerns about your Care? No.  Actions: * If pain score is 4 or above: No action needed, pain <4.

## 2016-09-20 NOTE — Telephone Encounter (Signed)
Name identifier, left message. 

## 2016-09-26 ENCOUNTER — Encounter: Payer: Self-pay | Admitting: Gastroenterology

## 2016-10-01 ENCOUNTER — Other Ambulatory Visit: Payer: Self-pay | Admitting: Urgent Care

## 2016-10-01 DIAGNOSIS — N400 Enlarged prostate without lower urinary tract symptoms: Secondary | ICD-10-CM

## 2016-10-28 ENCOUNTER — Encounter: Payer: Self-pay | Admitting: Physician Assistant

## 2016-10-28 ENCOUNTER — Ambulatory Visit (INDEPENDENT_AMBULATORY_CARE_PROVIDER_SITE_OTHER): Payer: Commercial Managed Care - HMO | Admitting: Physician Assistant

## 2016-10-28 VITALS — BP 144/74 | HR 88 | Temp 98.6°F | Resp 16 | Ht 71.5 in | Wt 312.8 lb

## 2016-10-28 DIAGNOSIS — R6889 Other general symptoms and signs: Secondary | ICD-10-CM | POA: Diagnosis not present

## 2016-10-28 DIAGNOSIS — Z136 Encounter for screening for cardiovascular disorders: Secondary | ICD-10-CM

## 2016-10-28 DIAGNOSIS — F17200 Nicotine dependence, unspecified, uncomplicated: Secondary | ICD-10-CM | POA: Diagnosis not present

## 2016-10-28 MED ORDER — MELOXICAM 15 MG PO TABS: 15.0000 mg | ORAL_TABLET | Freq: Every day | ORAL | 0 refills | Status: DC

## 2016-10-28 MED ORDER — CYCLOBENZAPRINE HCL 10 MG PO TABS: 10.0000 mg | ORAL_TABLET | Freq: Three times a day (TID) | ORAL | 0 refills | Status: DC | PRN

## 2016-10-28 NOTE — Patient Instructions (Addendum)
Please do not take the mobic with naproxen or ibuprofen. I would like you to take the mobic for at least 2 weeks, and take the flexeril at bedtime.   Please ice the area of that neck as well I am arranging for an abdominal aorta ultrasound.  Please await this contact.    IF you received an x-ray today, you will receive an invoice from [facility]. Please contact [facility] at [phone] with questions or concerns regarding your invoice.   IF you received labwork today, you will receive an invoice from [facility]. Please contact [facility] at [phone] with questions or concerns regarding your invoice.   Our billing staff will not be able to assist you with questions regarding bills from these companies.  You will be contacted with the lab results as soon as they are available. The fastest way to get your results is to activate your My Chart account. Instructions are located on the last page of this paperwork. If you have not heard from [place] regarding the results in 2 weeks, please contact this office.

## 2016-10-28 NOTE — Progress Notes (Signed)
PRIMARY CARE AT [facility] [address], [place] [address] [phone]  Date:  10/28/2016   Name:  Gabriel Fernandez   DOB:  [birth year]   MRN:  [id number]  PCP:  Patient, No Pcp Per    History of Present Illness:  Gabriel Fernandez is a 61 y.o. male patient who presents to PCP with  Chief Complaint  Patient presents with  . Neck Pain    neck pain (spasms) has been happening on and off for a few months.     Intermittent neck pain that occurs for a couple months.  It does not in particular hurts.  Left sided.  No pulsating pain.  "almost a numbing".  He has no noticed any swelling in that area.  He has had no trauma.  No new sleeping area.  Radiates to just below his cheek.     Patient Active Problem List   Diagnosis Date Noted  . Smoker 06/08/2013  . Other and unspecified hyperlipidemia 06/08/2013  . Gout 06/08/2013  . Severe obesity (BMI >= 40) ([place]) 06/08/2013    Past Medical History:  Diagnosis Date  . Allergy   . Diabetes mellitus without complication ([place])   . Gout   . Hyperlipidemia   . Sleep apnea    Not on CPAP    Past Surgical History:  Procedure Laterality Date  . COLONOSCOPY      Social History  Substance Use Topics  . Smoking status: Current Every Day Smoker    Packs/day: 1.00    Years: 35.00  . Smokeless tobacco: Never Used  . Alcohol use 3.6 oz/week    4 Glasses of wine, 2 Cans of beer per week    Family History  Problem Relation Age of Onset  . Cancer Mother   . Ovarian cancer Mother   . Stomach cancer Mother   . Colon cancer Neg Hx   . Esophageal cancer Neg Hx   . Rectal cancer Neg Hx     No Known Allergies  Medication list has been reviewed and updated.  Current Outpatient Prescriptions on File Prior to Visit  Medication Sig Dispense Refill  . atorvastatin (LIPITOR) 20 MG tablet TAKE 1 TABLET BY MOUTH EVERY DAY. 90 tablet 3  . metFORMIN (GLUCOPHAGE) 500 MG tablet Take 1 tablet (500 mg total) by mouth daily with breakfast. 90 tablet  3  . tamsulosin (FLOMAX) 0.4 MG CAPS capsule TAKE 1 CAPSULE BY MOUTH DAILY AFTER BREAKFAST. 30 capsule 5  . colchicine (COLCRYS) 0.6 MG tablet Take 1 tablet by mouth daily as needed for flare up. Dx code: 274.9. PATIENT NEEDS OFFICE VISIT FOR ADDITIONAL REFILLS (Patient not taking: Reported on 09/05/2016) 30 tablet 0  . Zoster Vaccine Live, PF, (ZOSTAVAX) 76734 UNT/0.65ML injection Inject 19,400 Units into the skin once. (Patient not taking: Reported on 10/28/2016) 1 each 0   Current Facility-Administered Medications on File Prior to Visit  Medication Dose Route Frequency Provider Last Rate Last Dose  . 0.9 %  sodium chloride infusion  500 mL Intravenous Continuous Armbruster, Renelda Loma, MD        ROS ROS otherwise unremarkable unless listed above.  Physical Examination: BP (!) 146/81 (BP Location: Right Arm, Patient Position: Sitting, Cuff Size: Large)   Pulse 88   Temp 98.6 F (37 C) (Oral)   Resp 16   Ht 5' 11.5" (1.816 m)   Wt (!) 312 lb 12.8 oz (141.9 kg)   SpO2 98%   BMI 43.02 kg/m  Ideal  Body Weight: Weight in (lb) to have BMI = 25: 181.4  Physical Exam  Constitutional: He is oriented to person, place, and time. He appears well-developed and well-nourished. No distress.  HENT:  Head: Normocephalic and atraumatic.  Eyes: Conjunctivae and EOM are normal. Pupils are equal, round, and reactive to light.  Neck: Full passive range of motion without pain. Muscular tenderness (just inferior to the ear with pain, but noted at this area to be where the sxs are located.  ) present. No spinous process tenderness present. No neck rigidity. Normal range of motion present.  Cardiovascular: Normal rate.   Pulses:      Carotid pulses are 2+ on the right side, and 2+ on the left side. Pulmonary/Chest: Effort normal. No respiratory distress.  Neurological: He is alert and oriented to person, place, and time.  Skin: Skin is warm and dry. He is not diaphoretic.  Psychiatric: He has a normal  mood and affect. His behavior is normal.     Assessment and Plan: Gabriel Fernandez is a 61 y.o. male who is here today for cc of neck symptoms. Advised ice msk relaxant, and anti-inflammatory.  Obtaining screening for abdominal ultrasound screening.  Significant risk factors for atherosclerotic changes.  Will follow up with carotid if this is symptomatic Patient notes that there was an abnormality in dental films but does not recall dental report.  Will obtain these records.  Neck symptom - Plan: cyclobenzaprine (FLEXERIL) 10 MG tablet, meloxicam (MOBIC) 15 MG tablet  Smoker  Screening for AAA (abdominal aortic aneurysm) - Plan: US ABDOMINAL AORTA SCREENING AAA  Ivar Drape, PA-C [facility] 5/27/20185:46 PM

## 2016-11-27 ENCOUNTER — Other Ambulatory Visit: Payer: Self-pay | Admitting: Physician Assistant

## 2016-11-27 DIAGNOSIS — R6889 Other general symptoms and signs: Secondary | ICD-10-CM

## 2017-01-17 ENCOUNTER — Ambulatory Visit
Admission: RE | Admit: 2017-01-17 | Discharge: 2017-01-17 | Disposition: A | Discharge status: Home / Self Care | Payer: 59 | Source: Ambulatory Visit | Attending: Physician Assistant | Admitting: Physician Assistant

## 2017-01-17 DIAGNOSIS — Z136 Encounter for screening for cardiovascular disorders: Secondary | ICD-10-CM

## 2017-01-18 ENCOUNTER — Other Ambulatory Visit: Payer: Self-pay | Admitting: Urgent Care

## 2017-01-18 DIAGNOSIS — N400 Enlarged prostate without lower urinary tract symptoms: Secondary | ICD-10-CM

## 2017-02-04 ENCOUNTER — Other Ambulatory Visit: Payer: Self-pay | Admitting: Urgent Care

## 2017-02-04 DIAGNOSIS — E119 Type 2 diabetes mellitus without complications: Secondary | ICD-10-CM

## 2017-04-04 ENCOUNTER — Other Ambulatory Visit: Payer: Self-pay | Admitting: Urgent Care

## 2017-04-04 DIAGNOSIS — E785 Hyperlipidemia, unspecified: Secondary | ICD-10-CM

## 2017-04-26 ENCOUNTER — Ambulatory Visit (INDEPENDENT_AMBULATORY_CARE_PROVIDER_SITE_OTHER): Payer: BLUE CROSS/BLUE SHIELD | Admitting: Physician Assistant

## 2017-04-26 ENCOUNTER — Encounter: Payer: Self-pay | Admitting: Physician Assistant

## 2017-04-26 VITALS — BP 120/78 | HR 60 | Temp 97.7°F | Resp 16 | Ht 71.25 in | Wt 257.6 lb

## 2017-04-26 DIAGNOSIS — Z0000 Encounter for general adult medical examination without abnormal findings: Secondary | ICD-10-CM | POA: Diagnosis not present

## 2017-04-26 DIAGNOSIS — N400 Enlarged prostate without lower urinary tract symptoms: Secondary | ICD-10-CM

## 2017-04-26 DIAGNOSIS — E119 Type 2 diabetes mellitus without complications: Secondary | ICD-10-CM

## 2017-04-26 DIAGNOSIS — Z23 Encounter for immunization: Secondary | ICD-10-CM | POA: Diagnosis not present

## 2017-04-26 DIAGNOSIS — Z1159 Encounter for screening for other viral diseases: Secondary | ICD-10-CM

## 2017-04-26 DIAGNOSIS — E785 Hyperlipidemia, unspecified: Secondary | ICD-10-CM

## 2017-04-26 MED ORDER — TAMSULOSIN HCL 0.4 MG PO CAPS: 0.4000 mg | ORAL_CAPSULE | Freq: Every day | ORAL | 1 refills | Status: DC

## 2017-04-26 MED ORDER — ATORVASTATIN CALCIUM 20 MG PO TABS: ORAL_TABLET | ORAL | 3 refills | Status: DC

## 2017-04-26 MED ORDER — METFORMIN HCL 500 MG PO TABS: 500.0000 mg | ORAL_TABLET | Freq: Every day | ORAL | 1 refills | Status: DC

## 2017-04-26 NOTE — Progress Notes (Signed)
PRIMARY CARE AT [facility] [address] [place] [address] [phone]  Date:  04/26/2017   Name:  Gabriel Fernandez   DOB:  1956-03-31   MRN:  [id number]  PCP:  Joretta Bachelor, PA    History of Present Illness:  Gabriel Fernandez is a 61 y.o. male patient who presents to PCP with  Chief Complaint  Patient presents with  . Annual Exam     DIET: he is eating "reasonably healthy".  He has stopped eating bread and cheese.  He is eating vegetables daily.  More salads.  Eating more organic.    BM: normal.  No blood or black stool.   URINATION: frequent urination for years.  No hematuria, or dysuria.    SLEEP: "fine" 6-10 hours.  During the work week, 6 hours  SOCIAL ACTIVITY He is going to [place] in few weeks to 1 month EtOH: 7 per week, 2-4 drinks per sitting Tobacco or vaping: 1ppd 30 years Illicit drug use: none Sexually active: normal active.  No difficulty.   Patient would like refill of medications. Dm/cholesterol: does not check his blood sugar.  No diarrhea, polyuria, vision changes, palpitations, chest pains, or sob. Attempts to manage his diet, however with travelling, may have foods without care of cholesterol monitoring.      Patient Active Problem List   Diagnosis Date Noted  . Smoker 06/08/2013  . Other and unspecified hyperlipidemia 06/08/2013  . Gout 06/08/2013  . Severe obesity (BMI >= 40) ([place]) 06/08/2013    Past Medical History:  Diagnosis Date  . Allergy   . Diabetes mellitus without complication ([place])   . Gout   . Hyperlipidemia   . Sleep apnea    Not on CPAP    Past Surgical History:  Procedure Laterality Date  . COLONOSCOPY      Social History   Tobacco Use  . Smoking status: Current Every Day Smoker    Packs/day: 1.00    Years: 35.00    Pack years: 35.00  . Smokeless tobacco: Never Used  Substance Use Topics  . Alcohol use: Yes    Alcohol/week: 3.6 oz    Types: 4 Glasses of wine, 2 Cans of beer per week  . Drug use: No     Family History  Problem Relation Age of Onset  . Cancer Mother   . Ovarian cancer Mother   . Stomach cancer Mother   . Colon cancer Neg Hx   . Esophageal cancer Neg Hx   . Rectal cancer Neg Hx     No Known Allergies  Medication list has been reviewed and updated.  Current Outpatient Medications on File Prior to Visit  Medication Sig Dispense Refill  . atorvastatin (LIPITOR) 20 MG tablet TAKE 1 TABLET BY MOUTH EVERY DAY. 90 tablet 3  . colchicine (COLCRYS) 0.6 MG tablet Take 1 tablet by mouth daily as needed for flare up. Dx code: 274.9. PATIENT NEEDS OFFICE VISIT FOR ADDITIONAL REFILLS 30 tablet 0  . metFORMIN (GLUCOPHAGE) 500 MG tablet Take 1 tablet (500 mg total) by mouth daily with breakfast. Office visit needed for additional refills 90 tablet 0  . tamsulosin (FLOMAX) 0.4 MG CAPS capsule Take 1 capsule (0.4 mg total) by mouth daily after breakfast. Will need an office visit for additional refills. 90 capsule 0  . Zoster Vaccine Live, PF, (ZOSTAVAX) 47654 UNT/0.65ML injection Inject 19,400 Units into the skin once. 1 each 0  . cyclobenzaprine (FLEXERIL) 10 MG tablet Take 1 tablet (  10 mg total) by mouth 3 (three) times daily as needed for muscle spasms. (Patient not taking: Reported on 04/26/2017) 30 tablet 0  . meloxicam (MOBIC) 15 MG tablet Take 1 tablet (15 mg total) by mouth daily. (Patient not taking: Reported on 04/26/2017) 30 tablet 0   Current Facility-Administered Medications on File Prior to Visit  Medication Dose Route Frequency Provider Last Rate Last Dose  . 0.9 %  sodium chloride infusion  500 mL Intravenous Continuous Armbruster, Carlota Raspberry, MD        ROS ROS otherwise unremarkable unless listed above.  Physical Examination: BP 120/78   Pulse 60   Temp 97.7 F (36.5 C) (Oral)   Resp 16   Ht 5' 11.25" (1.81 m)   Wt 257 lb 9.6 oz (116.8 kg)   SpO2 98%   BMI 35.68 kg/m  Ideal Body Weight: Weight in (lb) to have BMI = 25: 180.1  Physical Exam   Constitutional: He is oriented to person, place, and time. He appears well-developed and well-nourished. No distress.  HENT:  Head: Normocephalic and atraumatic.  Right Ear: Tympanic membrane, external ear and ear canal normal.  Left Ear: Tympanic membrane, external ear and ear canal normal.  Eyes: Conjunctivae and EOM are normal. Pupils are equal, round, and reactive to light.  Cardiovascular: Normal rate and regular rhythm. Exam reveals no friction rub.  No murmur heard. Pulses:      Dorsalis pedis pulses are 1+ on the right side, and 1+ on the left side.  Varicosities and spider veins are prominent bilaterally at his extremities.   Pulmonary/Chest: Effort normal. No accessory muscle usage. No respiratory distress. He has no decreased breath sounds. He has no wheezes.  Abdominal: Soft. Bowel sounds are normal. He exhibits no distension and no mass. There is no tenderness.  Musculoskeletal: Normal range of motion. He exhibits no edema or tenderness.  Neurological: He is alert and oriented to person, place, and time. He displays normal reflexes.  Skin: Skin is warm and dry. He is not diaphoretic.  Psychiatric: He has a normal mood and affect. His behavior is normal.     Assessment and Plan: Gabriel Fernandez is a 61 y.o. male who is here today for cc of  Chief Complaint  Patient presents with  . Annual Exam  --labs pending. --refills, with stable features. --immunizations updated.  Annual physical exam - Plan: CBC, CMP14+EGFR, PSA, TSH, Lipid panel, Tdap vaccine greater than or equal to 7yo IM, Hepatitis A vaccine adult IM, Hepatitis C antibody, Hemoglobin A1c  Flu vaccine need - Plan: Flu Vaccine QUAD 36+ mos IM  Need for pneumococcal vaccination - Plan: Pneumococcal conjugate vaccine 13-valent IM  Type 2 diabetes mellitus without complication, without long-term current use of insulin (HCC) - Plan: Hemoglobin A1c, metFORMIN (GLUCOPHAGE) 500 MG tablet  Benign prostatic  hyperplasia, unspecified whether lower urinary tract symptoms present - Plan: tamsulosin (FLOMAX) 0.4 MG CAPS capsule  Hyperlipidemia, unspecified hyperlipidemia type - Plan: atorvastatin (LIPITOR) 20 MG tablet  Need for hepatitis C screening test - Plan: Hepatitis C antibody, Hepatitis C antibody  Need for Tdap vaccination - Plan: Tdap vaccine greater than or equal to 7yo IM  Need for hepatitis A vaccination - Plan: Hepatitis A vaccine adult IM  Ivar Drape, PA-C [facility] 11/27/20189:18 AM

## 2017-04-26 NOTE — Patient Instructions (Addendum)
Keeping you healthy  Get these tests  Blood pressure- Have your blood pressure checked once a year by your healthcare provider.  Normal blood pressure is 120/80  Weight- Have your body mass index (BMI) calculated to screen for obesity.  BMI is a measure of body fat based on height and weight. You can also calculate your own BMI at www.nhlbisuport.com/bmi/.  Cholesterol- Have your cholesterol checked every year.  Diabetes- Have your blood sugar checked regularly if you have high blood pressure, high cholesterol, have a family history of diabetes or if you are overweight.  Screening for Colon Cancer- Colonoscopy starting at age 50.  Screening may begin sooner depending on your family history and other health conditions. Follow up colonoscopy as directed by your Gastroenterologist.  Screening for Prostate Cancer- Both blood work (PSA) and a rectal exam help screen for Prostate Cancer.  Screening begins at age 40 with African-American men and at age 50 with Caucasian men.  Screening may begin sooner depending on your family history.  Take these medicines  Aspirin- One aspirin daily can help prevent Heart disease and Stroke.  Flu shot- Every fall.  Tetanus- Every 10 years.  Zostavax- Once after the age of 60 to prevent Shingles.  Pneumonia shot- Once after the age of 65; if you are younger than 65, ask your healthcare provider if you need a Pneumonia shot.  Take these steps  Don't smoke- If you do smoke, talk to your doctor about quitting.  For tips on how to quit, go to www.smokefree.gov or call 1-800-QUIT-NOW.  Be physically active- Exercise 5 days a week for at least 30 minutes.  If you are not already physically active start slow and gradually work up to 30 minutes of moderate physical activity.  Examples of moderate activity include walking briskly, mowing the yard, dancing, swimming, bicycling, etc.  Eat a healthy diet- Eat a variety of healthy food such as fruits, vegetables,  low fat milk, low fat cheese, yogurt, lean meant, poultry, fish, beans, tofu, etc. For more information go to www.thenutritionsource.org  Drink alcohol in moderation- Limit alcohol intake to less than two drinks a day. Never drink and drive.  Dentist- Brush and floss twice daily; visit your dentist twice a year.  Depression- Your emotional health is as important as your physical health. If you're feeling down, or losing interest in things you would normally enjoy please talk to your healthcare provider.  Eye exam- Visit your eye doctor every year.  Safe sex- If you may be exposed to a sexually transmitted infection, use a condom.  Seat belts- Seat belts can save your life; always wear one.  Smoke/Carbon Monoxide detectors- These detectors need to be installed on the appropriate level of your home.  Replace batteries at least once a year.  Skin cancer- When out in the sun, cover up and use sunscreen 15 SPF or higher.  Violence- If anyone is threatening you, please tell your healthcare provider.  Living Will/ Health care power of attorney- Speak with your healthcare provider and family.   IF you received an x-ray today, you will receive an invoice from [facility]. Please contact  Radiology at [phone] with questions or concerns regarding your invoice.   IF you received labwork today, you will receive an invoice from [facility]. Please contact [facility] at [phone] with questions or concerns regarding your invoice.   Our billing staff will not be able to assist you with questions regarding bills from these companies.  You will be contacted   with the lab results as soon as they are available. The fastest way to get your results is to activate your My Chart account. Instructions are located on the last page of this paperwork. If you have not heard from us regarding the results in 2 weeks, please contact this office.     

## 2017-04-27 LAB — CMP14+EGFR
ALBUMIN: 4.2 g/dL (ref 3.6–4.8)
ALT: 17 IU/L (ref 0–44)
AST: 18 IU/L (ref 0–40)
Albumin/Globulin Ratio: 1.6 (ref 1.2–2.2)
Alkaline Phosphatase: 74 IU/L (ref 39–117)
BUN/Creatinine Ratio: 14 (ref 10–24)
BUN: 13 mg/dL (ref 8–27)
Bilirubin Total: 0.5 mg/dL (ref 0.0–1.2)
CO2: 23 mmol/L (ref 20–29)
CREATININE: 0.9 mg/dL (ref 0.76–1.27)
Calcium: 9.5 mg/dL (ref 8.6–10.2)
Chloride: 106 mmol/L (ref 96–106)
GFR calc Af Amer: 105 mL/min/{1.73_m2} (ref >59)
GFR calc non Af Amer: 91 mL/min/{1.73_m2} (ref >59)
GLUCOSE: 115 mg/dL — AB (ref 65–99)
Globulin, Total: 2.6 g/dL (ref 1.5–4.5)
Potassium: 4.3 mmol/L (ref 3.5–5.2)
Sodium: 143 mmol/L (ref 134–144)
Total Protein: 6.8 g/dL (ref 6.0–8.5)

## 2017-04-27 LAB — LIPID PANEL
Chol/HDL Ratio: 3.5 ratio (ref 0.0–5.0)
Cholesterol, Total: 159 mg/dL (ref 100–199)
HDL: 46 mg/dL (ref >39)
LDL CALC: 85 mg/dL (ref 0–99)
Triglycerides: 141 mg/dL (ref 0–149)
VLDL CHOLESTEROL CAL: 28 mg/dL (ref 5–40)

## 2017-04-27 LAB — CBC
Hematocrit: 46 % (ref 37.5–51.0)
Hemoglobin: 15.8 g/dL (ref 13.0–17.7)
MCH: 31.9 pg (ref 26.6–33.0)
MCHC: 34.3 g/dL (ref 31.5–35.7)
MCV: 93 fL (ref 79–97)
Platelets: 243 10*3/uL (ref 150–379)
RBC: 4.95 x10E6/uL (ref 4.14–5.80)
RDW: 14.4 % (ref 12.3–15.4)
WBC: 6.6 10*3/uL (ref 3.4–10.8)

## 2017-04-27 LAB — PSA: PROSTATE SPECIFIC AG, SERUM: 0.2 ng/mL (ref 0.0–4.0)

## 2017-04-27 LAB — HEPATITIS C ANTIBODY: Hep C Virus Ab: <0.1 s/co ratio (ref 0.0–0.9)

## 2017-04-27 LAB — TSH: TSH: 0.982 u[IU]/mL (ref 0.450–4.500)

## 2017-06-19 ENCOUNTER — Telehealth: Payer: Self-pay | Admitting: Physician Assistant

## 2017-06-19 NOTE — Telephone Encounter (Signed)
Copied from [place] [id number]. Topic: Quick Communication - Rx Refill/Question >> Jun 19, 2017 11:15 AM Synthia Innocent wrote: Medication: colchicine (COLCRYS) 0.6 MG tablet   Has the patient contacted their pharmacy? Yes.     (Agent: If no, request that the patient contact the pharmacy for the refill.)   [facility] [address]   Agent: Please be advised that RX refills may take up to 3 business days. We ask that you follow-up with your pharmacy.  Requesting this be done today  Also would like to speak to someone regarding CPE results

## 2017-06-20 MED ORDER — COLCHICINE 0.6 MG PO TABS: ORAL_TABLET | ORAL | 1 refills | Status: DC

## 2017-06-20 NOTE — Telephone Encounter (Signed)
His labs looked normal. I may have construed him, and to only contact him if labs were abnormal as he travels.   Kidney and liver function look normal.   No anemia or change in blood cells Prostate function appears normal. Thyroid function appears normal.  Normal cholesterol and triglycerides. hepatitis c was negative.  --refilling the colchicine.  He can use naproxen for pain as well.  If this does not improve, he should come in.

## 2017-06-20 NOTE — Telephone Encounter (Signed)
Phone call to patient. Regarding results from physical 04/26/17, he states, "I had a physical and I didn't hear back from anyone. I found results on Mychart the other day, but that's it." Regarding colchicine, medication has not been prescribed since 11/15/13. He states he is in pain- is having a flare-up and completely out of pain.  Medication pended for provider approval.   Provider, please prescribe medication if appropriate and relay advice regarding labs.

## 2017-06-21 NOTE — Telephone Encounter (Signed)
This was routed back to me.  Was the patient notified?

## 2017-06-22 NOTE — Telephone Encounter (Signed)
Phone call to patient. Notified regarding lab results and medication. Closing note.

## 2017-09-13 ENCOUNTER — Encounter: Payer: Self-pay | Admitting: Physician Assistant

## 2017-11-02 ENCOUNTER — Other Ambulatory Visit: Payer: Self-pay | Admitting: Physician Assistant

## 2017-11-02 DIAGNOSIS — N400 Enlarged prostate without lower urinary tract symptoms: Secondary | ICD-10-CM

## 2017-11-02 NOTE — Telephone Encounter (Signed)
Copied from [place] [id number]. Topic: Quick Communication - Rx Refill/Question >> Nov 02, 2017 10:08 AM Bea Graff, NT wrote: Medication: tamsulosin (FLOMAX) 0.4 MG CAPS capsule   Has the patient contacted their pharmacy? Yes.   (Agent: If no, request that the patient contact the pharmacy for the refill.) (Agent: If yes, when and what did the pharmacy advise?)  Preferred Pharmacy (with phone number or street name[id number] [place] [place] - [address] [phone] (Phone) [phone] (Fax)      Agent: Please be advised that RX refills may take up to 3 business days. We ask that you follow-up with your pharmacy.

## 2017-11-02 NOTE — Telephone Encounter (Signed)
Patient called and advised that Ivar Drape, PA is no longer at [facility] and he will need to establish care with another provider. Patient agreed to an appointment with new provider in order to receive refills.  Flomax refill Last OV:04/26/17 Last refill:04/26/17 90 cap/1 refill WTU:[facility] (Transfer care: Upcoming appointment 11/10/17) [id number] [place] [place] [phone] (Phone) [phone] (Fax)

## 2017-11-03 MED ORDER — TAMSULOSIN HCL 0.4 MG PO CAPS: 0.4000 mg | ORAL_CAPSULE | Freq: Every day | ORAL | 0 refills | Status: DC

## 2017-11-10 ENCOUNTER — Ambulatory Visit: Payer: 59 | Admitting: Physician Assistant

## 2017-11-10 ENCOUNTER — Other Ambulatory Visit: Payer: Self-pay

## 2017-11-10 ENCOUNTER — Encounter: Payer: Self-pay | Admitting: Physician Assistant

## 2017-11-10 VITALS — BP 128/68 | HR 63 | Temp 98.2°F | Resp 16 | Ht 71.25 in | Wt 243.4 lb

## 2017-11-10 DIAGNOSIS — R7303 Prediabetes: Secondary | ICD-10-CM

## 2017-11-10 DIAGNOSIS — R05 Cough: Secondary | ICD-10-CM

## 2017-11-10 DIAGNOSIS — R059 Cough, unspecified: Secondary | ICD-10-CM

## 2017-11-10 DIAGNOSIS — Z23 Encounter for immunization: Secondary | ICD-10-CM

## 2017-11-10 DIAGNOSIS — N400 Enlarged prostate without lower urinary tract symptoms: Secondary | ICD-10-CM

## 2017-11-10 MED ORDER — ZOSTER VAC RECOMB ADJUVANTED 50 MCG/0.5ML IM SUSR: 0.5000 mL | Freq: Once | INTRAMUSCULAR | 1 refills | Status: AC

## 2017-11-10 MED ORDER — NICOTINE 21 MG/24HR TD PT24: 21.0000 mg | MEDICATED_PATCH | Freq: Every day | TRANSDERMAL | 2 refills | Status: DC

## 2017-11-10 MED ORDER — BENZONATATE 200 MG PO CAPS: 200.0000 mg | ORAL_CAPSULE | Freq: Two times a day (BID) | ORAL | 0 refills | Status: DC | PRN

## 2017-11-10 MED ORDER — TAMSULOSIN HCL 0.4 MG PO CAPS: 0.4000 mg | ORAL_CAPSULE | Freq: Every day | ORAL | 3 refills | Status: DC

## 2017-11-10 MED ORDER — DOXYCYCLINE HYCLATE 100 MG PO CAPS: 100.0000 mg | ORAL_CAPSULE | Freq: Two times a day (BID) | ORAL | 0 refills | Status: AC

## 2017-11-10 NOTE — Progress Notes (Signed)
11/10/2017 3:31 PM   DOB: 1955-09-11 / MRN: [id number]  SUBJECTIVE:  Gabriel Fernandez is a 62 y.o. male presenting for cough.  Is been present 3 days and he associates some mild nasal congestion.  Feels he is getting worse.  He denies leg swelling, shortness of breath, DOE.  Cough is productive of sputum.  He has a 35-pack-year history of smoking.  He is never been diagnosed with COPD nor has he had a chest x-ray in this system.  He tells me he needs a refill of his Flomax.  He tells me he still peeing a lot however this seems to be secondary to alcohol consumption.  He would like the shingles vaccination.  Immunization History  Administered Date(s) Administered  . Hepatitis A, Adult 09/06/2013, 04/26/2017  . Influenza,inj,Quad PF,6+ Mos 06/08/2013, 04/26/2017  . Pneumococcal Conjugate-13 04/26/2017  . Tdap 04/26/2017     He has No Known Allergies.   He  has a past medical history of Allergy, Diabetes mellitus without complication ([place]), Gout, Hyperlipidemia, and Sleep apnea.    He  reports that he has been smoking.  He has a 35.00 pack-year smoking history. He has never used smokeless tobacco. He reports that he drinks about 3.6 oz of alcohol per week. He reports that he does not use drugs. He  has no sexual activity history on file. The patient  has a past surgical history that includes Colonoscopy.  His family history includes Cancer in his mother; Ovarian cancer in his mother; Stomach cancer in his mother.  Review of Systems  Constitutional: Positive for malaise/fatigue. Negative for chills, diaphoresis and fever.  Respiratory: Negative for hemoptysis.   Cardiovascular: Negative for chest pain.  Gastrointestinal: Negative for nausea.  Skin: Negative for rash.  Neurological: Negative for dizziness.    The problem list and medications were reviewed and updated by myself where necessary and exist elsewhere in the encounter.   OBJECTIVE:  BP 128/68   Pulse 63   Temp 98.2 F  (36.8 C)   Resp 16   Ht 5' 11.25" (1.81 m)   Wt 243 lb 6.4 oz (110.4 kg)   SpO2 97%   BMI 33.71 kg/m    Wt Readings from Last 3 Encounters:  11/10/17 243 lb 6.4 oz (110.4 kg)  04/26/17 257 lb 9.6 oz (116.8 kg)  10/28/16 (!) 312 lb 12.8 oz (141.9 kg)   Lab Results  Component Value Date   WBC 6.6 04/26/2017   HGB 15.8 04/26/2017   HCT 46.0 04/26/2017   MCV 93 04/26/2017   PLT 243 04/26/2017    Lab Results  Component Value Date   CREATININE 0.90 04/26/2017   BUN 13 04/26/2017   NA 143 04/26/2017   K 4.3 04/26/2017   CL 106 04/26/2017   CO2 23 04/26/2017    Lab Results  Component Value Date   ALT 17 04/26/2017   AST 18 04/26/2017   ALKPHOS 74 04/26/2017   BILITOT 0.5 04/26/2017    Lab Results  Component Value Date   TSH 0.982 04/26/2017    Lab Results  Component Value Date   HGBA1C 6.2 (H) 04/01/2016    Lab Results  Component Value Date   CHOL 159 04/26/2017   HDL 46 04/26/2017   LDLCALC 85 04/26/2017   TRIG 141 04/26/2017   CHOLHDL 3.5 04/26/2017     Physical Exam  Constitutional: He is oriented to person, place, and time. He appears well-developed. He is active.  Non-toxic appearance.  He does not appear ill.  Eyes: Pupils are equal, round, and reactive to light. Conjunctivae and EOM are normal.  Cardiovascular: Normal rate, regular rhythm, S1 normal, S2 normal, normal heart sounds, intact distal pulses and normal pulses. Exam reveals no gallop and no friction rub.  No murmur heard. Pulmonary/Chest: Effort normal. No stridor. No respiratory distress. He has no wheezes. He has no rales.  Abdominal: He exhibits no distension.  Musculoskeletal: Normal range of motion. He exhibits no edema.  Neurological: He is alert and oriented to person, place, and time. No cranial nerve deficit. Coordination normal.  Skin: Skin is warm and dry. He is not diaphoretic. No pallor.  Psychiatric: He has a normal mood and affect.  Nursing note and vitals  reviewed.   No results found for this or any previous visit (from the past 72 hour(s)).  No results found.  ASSESSMENT AND PLAN:  Gabriel Fernandez was seen today for refills and establish care.  Diagnoses and all orders for this visit:  Prediabetes -     Hemoglobin A1c  Benign prostatic hyperplasia, unspecified whether lower urinary tract symptoms present -     tamsulosin (FLOMAX) 0.4 MG CAPS capsule; Take 1 capsule (0.4 mg total) by mouth daily after breakfast.  Cough: Patient with a 35-pack-year history of smoking.  Despite negative chest x-ray to be smart cover him with a antibiotic given he most likely has COPD. -     doxycycline (VIBRAMYCIN) 100 MG capsule; Take 1 capsule (100 mg total) by mouth 2 (two) times daily for 10 days. -     benzonatate (TESSALON) 200 MG capsule; Take 1 capsule (200 mg total) by mouth 2 (two) times daily as needed for cough.  Need for shingles vaccine -     nicotine (NICODERM CQ) 21 mg/24hr patch; Place 1 patch (21 mg total) onto the skin daily.  Other orders -     Zoster Vaccine Adjuvanted [facility]) injection; Inject 0.5 mLs into the muscle once for 1 dose. Due for booster in 6 months.    The patient is advised to call or return to clinic if he does not see an improvement in symptoms, or to seek the care of the closest emergency department if he worsens with the above plan.   Philis Fendt, MHS, PA-C [facility] 11/10/2017 3:31 PM

## 2017-11-10 NOTE — Patient Instructions (Signed)
     IF you received an x-ray today, you will receive an invoice from [facility]. Please contact [facility] at [phone] with questions or concerns regarding your invoice.   IF you received labwork today, you will receive an invoice from [facility]. Please contact [facility] at [phone] with questions or concerns regarding your invoice.   Our billing staff will not be able to assist you with questions regarding bills from these companies.  You will be contacted with the lab results as soon as they are available. The fastest way to get your results is to activate your My Chart account. Instructions are located on the last page of this paperwork. If you have not heard from us regarding the results in 2 weeks, please contact this office.     

## 2017-11-11 LAB — HEMOGLOBIN A1C
Est. average glucose Bld gHb Est-mCnc: 117 mg/dL
Hgb A1c MFr Bld: 5.7 % — ABNORMAL HIGH (ref 4.8–5.6)

## 2018-01-26 LAB — HM DIABETES EYE EXAM

## 2018-02-20 ENCOUNTER — Other Ambulatory Visit: Payer: Self-pay | Admitting: Physician Assistant

## 2018-02-20 DIAGNOSIS — E119 Type 2 diabetes mellitus without complications: Secondary | ICD-10-CM

## 2018-02-20 MED ORDER — METFORMIN HCL 500 MG PO TABS: 500.0000 mg | ORAL_TABLET | Freq: Every day | ORAL | 0 refills | Status: DC

## 2018-02-20 NOTE — Telephone Encounter (Signed)
Rx refill request: metformin 500 mg    Last filled: 04/26/17  Call to patient- he was seen 11/10/17 by Rhetta Mura- he did not get Rx refilled because he had extra and didn't need. He now needs RF. Patient does not have designated PCP at practice- he has seen numerous providers. Offered to make patient appointment for his annual exam- and he declined due to his schedule- he is not sure at this time. Per visit- he should be fine for refills- but need provider to fill.  LOV: 11/10/17  PCP: none  Pharmacy: verified

## 2018-02-20 NOTE — Telephone Encounter (Signed)
Copied from [place] [id number]. Topic: General - Other >> Feb 20, 2018  3:51 PM Janace Aris A wrote: Medication: metFORMIN (GLUCOPHAGE) 500 MG   Has the patient contacted their pharmacy? Yes  Preferred Pharmacy (with phone number [facility] name[id number] [place] [place]  [phone] (Phone) [phone] (Fax)

## 2018-05-16 ENCOUNTER — Other Ambulatory Visit: Payer: Self-pay | Admitting: Family Medicine

## 2018-05-16 DIAGNOSIS — E119 Type 2 diabetes mellitus without complications: Secondary | ICD-10-CM

## 2018-05-16 NOTE — Telephone Encounter (Signed)
Patient has been scheduled for 6 month follow up- he states he has not been able to fill his medication

## 2018-06-12 ENCOUNTER — Ambulatory Visit: Payer: Self-pay | Admitting: Emergency Medicine

## 2018-06-15 ENCOUNTER — Ambulatory Visit (INDEPENDENT_AMBULATORY_CARE_PROVIDER_SITE_OTHER): Payer: Medicare Other | Admitting: Emergency Medicine

## 2018-06-15 ENCOUNTER — Encounter: Payer: Self-pay | Admitting: Emergency Medicine

## 2018-06-15 ENCOUNTER — Other Ambulatory Visit: Payer: Self-pay

## 2018-06-15 VITALS — BP 147/77 | HR 74 | Temp 97.7°F | Resp 16 | Ht 71.5 in | Wt 279.4 lb

## 2018-06-15 DIAGNOSIS — E785 Hyperlipidemia, unspecified: Secondary | ICD-10-CM | POA: Diagnosis not present

## 2018-06-15 DIAGNOSIS — E119 Type 2 diabetes mellitus without complications: Secondary | ICD-10-CM

## 2018-06-15 DIAGNOSIS — N400 Enlarged prostate without lower urinary tract symptoms: Secondary | ICD-10-CM

## 2018-06-15 LAB — POCT GLYCOSYLATED HEMOGLOBIN (HGB A1C): Hemoglobin A1C: 5.6 % (ref 4.0–5.6)

## 2018-06-15 LAB — GLUCOSE, POCT (MANUAL RESULT ENTRY): POC GLUCOSE: 109 mg/dL — AB (ref 70–99)

## 2018-06-15 MED ORDER — ATORVASTATIN CALCIUM 20 MG PO TABS: ORAL_TABLET | ORAL | 3 refills | Status: DC

## 2018-06-15 MED ORDER — METFORMIN HCL 500 MG PO TABS: ORAL_TABLET | ORAL | 3 refills | Status: DC

## 2018-06-15 MED ORDER — TAMSULOSIN HCL 0.4 MG PO CAPS: 0.4000 mg | ORAL_CAPSULE | Freq: Every day | ORAL | 3 refills | Status: DC

## 2018-06-15 NOTE — Patient Instructions (Addendum)
   If you have lab work done today you will be contacted with your lab results within the next 2 weeks.  If you have not heard from us then please contact us. The fastest way to get your results is to register for My Chart.   IF you received an x-ray today, you will receive an invoice from [facility]. Please contact [facility] at [phone] with questions or concerns regarding your invoice.   IF you received labwork today, you will receive an invoice from [facility]. Please contact [facility] at [phone] with questions or concerns regarding your invoice.   Our billing staff will not be able to assist you with questions regarding bills from these companies.  You will be contacted with the lab results as soon as they are available. The fastest way to get your results is to activate your My Chart account. Instructions are located on the last page of this paperwork. If you have not heard from us regarding the results in 2 weeks, please contact this office.     Diabetes Mellitus and Nutrition, Adult When you have diabetes (diabetes mellitus), it is very important to have healthy eating habits because your blood sugar (glucose) levels are greatly affected by what you eat and drink. Eating healthy foods in the appropriate amounts, at about the same times every day, can help you:  Control your blood glucose.  Lower your risk of heart disease.  Improve your blood pressure.  Reach or maintain a healthy weight. Every person with diabetes is different, and each person has different needs for a meal plan. Your health care provider may recommend that you work with a diet and nutrition specialist (dietitian) to make a meal plan that is best for you. Your meal plan may vary depending on factors such as:  The calories you need.  The medicines you take.  Your weight.  Your blood glucose, blood pressure, and cholesterol levels.  Your activity level.  Other health conditions  you have, such as heart or kidney disease. How do carbohydrates affect me? Carbohydrates, also called carbs, affect your blood glucose level more than any other type of food. Eating carbs naturally raises the amount of glucose in your blood. Carb counting is a method for keeping track of how many carbs you eat. Counting carbs is important to keep your blood glucose at a healthy level, especially if you use insulin or take certain oral diabetes medicines. It is important to know how many carbs you can safely have in each meal. This is different for every person. Your dietitian can help you calculate how many carbs you should have at each meal and for each snack. Foods that contain carbs include:  Bread, cereal, rice, pasta, and crackers.  Potatoes and corn.  Peas, beans, and lentils.  Milk and yogurt.  Fruit and juice.  Desserts, such as cakes, cookies, ice cream, and candy. How does alcohol affect me? Alcohol can cause a sudden decrease in blood glucose (hypoglycemia), especially if you use insulin or take certain oral diabetes medicines. Hypoglycemia can be a life-threatening condition. Symptoms of hypoglycemia (sleepiness, dizziness, and confusion) are similar to symptoms of having too much alcohol. If your health care provider says that alcohol is safe for you, follow these guidelines:  Limit alcohol intake to no more than 1 drink per day for nonpregnant women and 2 drinks per day for men. One drink equals 12 oz of beer, 5 oz of wine, or 1 oz of hard liquor.    Do not drink on an empty stomach.  Keep yourself hydrated with water, diet soda, or unsweetened iced tea.  Keep in mind that regular soda, juice, and other mixers may contain a lot of sugar and must be counted as carbs. What are tips for following this plan?  Reading food labels  Start by checking the serving size on the "Nutrition Facts" label of packaged foods and drinks. The amount of calories, carbs, fats, and other  nutrients listed on the label is based on one serving of the item. Many items contain more than one serving per package.  Check the total grams (g) of carbs in one serving. You can calculate the number of servings of carbs in one serving by dividing the total carbs by 15. For example, if a food has 30 g of total carbs, it would be equal to 2 servings of carbs.  Check the number of grams (g) of saturated and trans fats in one serving. Choose foods that have low or no amount of these fats.  Check the number of milligrams (mg) of salt (sodium) in one serving. Most people should limit total sodium intake to less than 2,300 mg per day.  Always check the nutrition information of foods labeled as "low-fat" or "nonfat". These foods may be higher in added sugar or refined carbs and should be avoided.  Talk to your dietitian to identify your daily goals for nutrients listed on the label. Shopping  Avoid buying canned, premade, or processed foods. These foods tend to be high in fat, sodium, and added sugar.  Shop around the outside edge of the grocery store. This includes fresh fruits and vegetables, bulk grains, fresh meats, and fresh dairy. Cooking  Use low-heat cooking methods, such as baking, instead of high-heat cooking methods like deep frying.  Cook using healthy oils, such as olive, canola, or sunflower oil.  Avoid cooking with butter, cream, or high-fat meats. Meal planning  Eat meals and snacks regularly, preferably at the same times every day. Avoid going long periods of time without eating.  Eat foods high in fiber, such as fresh fruits, vegetables, beans, and whole grains. Talk to your dietitian about how many servings of carbs you can eat at each meal.  Eat 4-6 ounces (oz) of lean protein each day, such as lean meat, chicken, fish, eggs, or tofu. One oz of lean protein is equal to: ? 1 oz of meat, chicken, or fish. ? 1 egg. ?  cup of tofu.  Eat some foods each day that contain  healthy fats, such as avocado, nuts, seeds, and fish. Lifestyle  Check your blood glucose regularly.  Exercise regularly as told by your health care provider. This may include: ? 150 minutes of moderate-intensity or vigorous-intensity exercise each week. This could be brisk walking, biking, or water aerobics. ? Stretching and doing strength exercises, such as yoga or weightlifting, at least 2 times a week.  Take medicines as told by your health care provider.  Do not use any products that contain nicotine or tobacco, such as cigarettes and e-cigarettes. If you need help quitting, ask your health care provider.  Work with a counselor or diabetes educator to identify strategies to manage stress and any emotional and social challenges. Questions to ask a health care provider  Do I need to meet with a diabetes educator?  Do I need to meet with a dietitian?  What number can I call if I have questions?  When are the best times to   check my blood glucose? Where to find more information:  American Diabetes Association: diabetes.org  Academy of Nutrition and Dietetics: www.eatright.org  National Institute of Diabetes and Digestive and Kidney Diseases (NIH): www.niddk.nih.gov Summary  A healthy meal plan will help you control your blood glucose and maintain a healthy lifestyle.  Working with a diet and nutrition specialist (dietitian) can help you make a meal plan that is best for you.  Keep in mind that carbohydrates (carbs) and alcohol have immediate effects on your blood glucose levels. It is important to count carbs and to use alcohol carefully. This information is not intended to replace advice given to you by your health care provider. Make sure you discuss any questions you have with your health care provider. Document Released: 02/17/2005 Document Revised: 12/21/2016 Document Reviewed: 06/27/2016 Elsevier Interactive Patient Education  2019 [employer].  

## 2018-06-15 NOTE — Progress Notes (Signed)
Gabriel Fernandez 63 y.o.   Chief Complaint  Patient presents with  . Establish Care  . Medication Refill    Atorvastatin, Metformin and Tamsulosin   Lab Results  Component Value Date   HGBA1C 5.7 (H) 11/10/2017   BP Readings from Last 3 Encounters:  06/15/18 (!) 151/84  11/10/17 128/68  04/26/17 120/78   Wt Readings from Last 3 Encounters:  06/15/18 279 lb 6.4 oz (126.7 kg)  11/10/17 243 lb 6.4 oz (110.4 kg)  04/26/17 257 lb 9.6 oz (116.8 kg)   Lab Results  Component Value Date   CHOL 159 04/26/2017   HDL 46 04/26/2017   LDLCALC 85 04/26/2017   TRIG 141 04/26/2017   CHOLHDL 3.5 04/26/2017    HISTORY OF PRESENT ILLNESS: This is a 63 y.o. male with a history of diabetes here for follow-up and medication refill.  Has been off medication for the past 2 to 3 months.  Has no complaints or medical concerns today.  Here to establish care with me.  HPI   Prior to Admission medications   Medication Sig Start Date End Date Taking? Authorizing Provider  atorvastatin (LIPITOR) 20 MG tablet TAKE 1 TABLET BY MOUTH EVERY DAY. 04/26/17  Yes English, Colletta [place] D, PA  metFORMIN (GLUCOPHAGE) 500 MG tablet TAKE 1 TABLET BY MOUTH EVERY DAY WITH BREAKFAST 05/16/18  Yes Rutherford Guys, MD  tamsulosin [facility]) 0.4 MG CAPS capsule Take 1 capsule (0.4 mg total) by mouth daily after breakfast. 11/10/17  Yes Tereasa Coop, PA-C  nicotine (NICODERM CQ) 21 mg/24hr patch Place 1 patch (21 mg total) onto the skin daily. Patient not taking: Reported on 06/15/2018 11/10/17   Tereasa Coop, PA-C    No Known Allergies  Patient Active Problem List   Diagnosis Date Noted  . Smoker 06/08/2013  . Other and unspecified hyperlipidemia 06/08/2013  . Gout 06/08/2013  . Severe obesity (BMI >= 40) ([place]) 06/08/2013    Past Medical History:  Diagnosis Date  . Allergy   . Diabetes mellitus without complication ([place])   . Gout   . Hyperlipidemia   . Sleep apnea    Not on CPAP    Past Surgical  History:  Procedure Laterality Date  . COLONOSCOPY      Social History   Socioeconomic History  . Marital status: Single    Spouse name: Not on file  . Number of children: Not on file  . Years of education: Not on file  . Highest education level: Not on file  Occupational History  . Not on file  Social Needs  . Financial resource strain: Not on file  . Food insecurity:    Worry: Not on file    Inability: Not on file  . Transportation needs:    Medical: Not on file    Non-medical: Not on file  Tobacco Use  . Smoking status: Current Every Day Smoker    Packs/day: 1.00    Years: 35.00    Pack years: 35.00  . Smokeless tobacco: Never Used  Substance and Sexual Activity  . Alcohol use: Yes    Alcohol/week: 6.0 standard drinks    Types: 4 Glasses of wine, 2 Cans of beer per week  . Drug use: No  . Sexual activity: Not on file  Lifestyle  . Physical activity:    Days per week: Not on file    Minutes per session: Not on file  . Stress: Not on file  Relationships  . Social connections:  Talks on phone: Not on file    Gets together: Not on file    Attends religious service: Not on file    Active member of club or organization: Not on file    Attends meetings of clubs or organizations: Not on file    Relationship status: Not on file  . Intimate partner violence:    Fear of current or ex partner: Not on file    Emotionally abused: Not on file    Physically abused: Not on file    Forced sexual activity: Not on file  Other Topics Concern  . Not on file  Social History Narrative  . Not on file    Family History  Problem Relation Age of Onset  . Cancer Mother   . Ovarian cancer Mother   . Stomach cancer Mother   . Colon cancer Neg Hx   . Esophageal cancer Neg Hx   . Rectal cancer Neg Hx      Review of Systems  Constitutional: Negative.  Negative for chills, fever and weight loss.  HENT: Negative.  Negative for sore throat.   Eyes: Negative.  Negative for  blurred vision and double vision.  Respiratory: Negative.  Negative for cough and shortness of breath.   Cardiovascular: Negative.  Negative for chest pain and palpitations.  Gastrointestinal: Negative.  Negative for abdominal pain, diarrhea, nausea and vomiting.  Genitourinary: Negative.  Negative for dysuria and hematuria.  Musculoskeletal: Negative.  Negative for back pain, myalgias and neck pain.  Skin: Negative.  Negative for rash.  Neurological: Negative.  Negative for dizziness and headaches.  Endo/Heme/Allergies: Negative.   All other systems reviewed and are negative.  Vitals:   06/15/18 1507 06/15/18 1553  BP: (!) 151/84 (!) 147/77  Pulse: 75 74  Resp: 16   Temp: 97.7 F (36.5 C)   SpO2: 95%      Physical Exam Vitals signs reviewed.  Constitutional:      Appearance: He is obese.  HENT:     Head: Normocephalic and atraumatic.     Nose: Nose normal.     Mouth/Throat:     Mouth: Mucous membranes are moist.     Pharynx: Oropharynx is clear.  Eyes:     Extraocular Movements: Extraocular movements intact.     Conjunctiva/sclera: Conjunctivae normal.     Pupils: Pupils are equal, round, and reactive to light.  Neck:     Musculoskeletal: Normal range of motion and neck supple.  Cardiovascular:     Rate and Rhythm: Normal rate and regular rhythm.     Heart sounds: Normal heart sounds.  Pulmonary:     Effort: Pulmonary effort is normal.     Breath sounds: Normal breath sounds.  Abdominal:     Palpations: Abdomen is soft.     Tenderness: There is no abdominal tenderness.  Musculoskeletal: Normal range of motion.  Skin:    General: Skin is warm and dry.  Neurological:     General: No focal deficit present.     Mental Status: He is alert and oriented to person, place, and time.     Sensory: No sensory deficit.  Psychiatric:        Mood and Affect: Mood normal.        Behavior: Behavior normal.     Results for orders placed or performed in visit on 06/15/18  (from the past 24 hour(s))  POCT glucose (manual entry)     Status: Abnormal   Collection Time: 06/15/18  3:54  PM  Result Value Ref Range   POC Glucose 109 (A) 70 - 99 mg/dl  POCT glycosylated hemoglobin (Hb A1C)     Status: None   Collection Time: 06/15/18  4:01 PM  Result Value Ref Range   Hemoglobin A1C 5.6 4.0 - 5.6 %   HbA1c POC (<> result, manual entry)     HbA1c, POC (prediabetic range)     HbA1c, POC (controlled diabetic range)     A total of 25 minutes was spent in the room with the patient, greater than 50% of which was in counseling/coordination of care regarding chronic medical conditions medications, nutrition and exercise, and need for follow-up.  ASSESSMENT & PLAN: Baily was seen today for establish care and medication refill.  Diagnoses and all orders for this visit:  Type 2 diabetes mellitus without complication, without long-term current use of insulin (HCC) -     POCT glucose (manual entry) -     POCT glycosylated hemoglobin (Hb A1C) -     CBC with Differential/Platelet -     Comprehensive metabolic panel -     Lipid panel -     TSH -     Microalbumin, urine -     HM Diabetes Foot Exam -     metFORMIN (GLUCOPHAGE) 500 MG tablet; TAKE 1 TABLET BY MOUTH EVERY DAY WITH BREAKFAST  Hyperlipidemia, unspecified hyperlipidemia type -     atorvastatin (LIPITOR) 20 MG tablet; TAKE 1 TABLET BY MOUTH EVERY DAY.  Benign prostatic hyperplasia, unspecified whether lower urinary tract symptoms present -     tamsulosin (FLOMAX) 0.4 MG CAPS capsule; Take 1 capsule (0.4 mg total) by mouth daily after breakfast.    Patient Instructions       If you have lab work done today you will be contacted with your lab results within the next 2 weeks.  If you have not heard from [place] then please contact us. The fastest way to get your results is to register for My Chart.   IF you received an x-ray today, you will receive an invoice from [facility]. Please contact  [facility] Radiology at [phone] with questions or concerns regarding your invoice.   IF you received labwork today, you will receive an invoice from [place]. Please contact [facility] at [phone] with questions or concerns regarding your invoice.   Our billing staff will not be able to assist you with questions regarding bills from these companies.  You will be contacted with the lab results as soon as they are available. The fastest way to get your results is to activate your My Chart account. Instructions are located on the last page of this paperwork. If you have not heard from [place] regarding the results in 2 weeks, please contact this office.     Diabetes Mellitus and Nutrition, Adult When you have diabetes (diabetes mellitus), it is very important to have healthy eating habits because your blood sugar (glucose) levels are greatly affected by what you eat and drink. Eating healthy foods in the appropriate amounts, at about the same times every day, can help you:  Control your blood glucose.  Lower your risk of heart disease.  Improve your blood pressure.  Reach or maintain a healthy weight. Every person with diabetes is different, and each person has different needs for a meal plan. Your health care provider may recommend that you work with a diet and nutrition specialist (dietitian) to make a meal plan that is best for you. Your meal plan  may vary depending on factors such as:  The calories you need.  The medicines you take.  Your weight.  Your blood glucose, blood pressure, and cholesterol levels.  Your activity level.  Other health conditions you have, such as heart or kidney disease. How do carbohydrates affect me? Carbohydrates, also called carbs, affect your blood glucose level more than any other type of food. Eating carbs naturally raises the amount of glucose in your blood. Carb counting is a method for keeping track of how many carbs you eat. Counting carbs is  important to keep your blood glucose at a healthy level, especially if you use insulin or take certain oral diabetes medicines. It is important to know how many carbs you can safely have in each meal. This is different for every person. Your dietitian can help you calculate how many carbs you should have at each meal and for each snack. Foods that contain carbs include:  Bread, cereal, rice, pasta, and crackers.  Potatoes and corn.  Peas, beans, and lentils.  Milk and yogurt.  Fruit and juice.  Desserts, such as cakes, cookies, ice cream, and candy. How does alcohol affect me? Alcohol can cause a sudden decrease in blood glucose (hypoglycemia), especially if you use insulin or take certain oral diabetes medicines. Hypoglycemia can be a life-threatening condition. Symptoms of hypoglycemia (sleepiness, dizziness, and confusion) are similar to symptoms of having too much alcohol. If your health care provider says that alcohol is safe for you, follow these guidelines:  Limit alcohol intake to no more than 1 drink per day for nonpregnant women and 2 drinks per day for men. One drink equals 12 oz of beer, 5 oz of wine, or 1 oz of hard liquor.  Do not drink on an empty stomach.  Keep yourself hydrated with water, diet soda, or unsweetened iced tea.  Keep in mind that regular soda, juice, and other mixers may contain a lot of sugar and must be counted as carbs. What are tips for following this plan?  Reading food labels  Start by checking the serving size on the "Nutrition Facts" label of packaged foods and drinks. The amount of calories, carbs, fats, and other nutrients listed on the label is based on one serving of the item. Many items contain more than one serving per package.  Check the total grams (g) of carbs in one serving. You can calculate the number of servings of carbs in one serving by dividing the total carbs by 15. For example, if a food has 30 g of total carbs, it would be  equal to 2 servings of carbs.  Check the number of grams (g) of saturated and trans fats in one serving. Choose foods that have low or no amount of these fats.  Check the number of milligrams (mg) of salt (sodium) in one serving. Most people should limit total sodium intake to less than 2,300 mg per day.  Always check the nutrition information of foods labeled as "low-fat" or "nonfat". These foods may be higher in added sugar or refined carbs and should be avoided.  Talk to your dietitian to identify your daily goals for nutrients listed on the label. Shopping  Avoid buying canned, premade, or processed foods. These foods tend to be high in fat, sodium, and added sugar.  Shop around the outside edge of the grocery store. This includes fresh fruits and vegetables, bulk grains, fresh meats, and fresh dairy. Cooking  Use low-heat cooking methods, such as baking, instead  of high-heat cooking methods like deep frying.  Cook using healthy oils, such as olive, canola, or sunflower oil.  Avoid cooking with butter, cream, or high-fat meats. Meal planning  Eat meals and snacks regularly, preferably at the same times every day. Avoid going long periods of time without eating.  Eat foods high in fiber, such as fresh fruits, vegetables, beans, and whole grains. Talk to your dietitian about how many servings of carbs you can eat at each meal.  Eat 4-6 ounces (oz) of lean protein each day, such as lean meat, chicken, fish, eggs, or tofu. One oz of lean protein is equal to: ? 1 oz of meat, chicken, or fish. ? 1 egg. ?  cup of tofu.  Eat some foods each day that contain healthy fats, such as avocado, nuts, seeds, and fish. Lifestyle  Check your blood glucose regularly.  Exercise regularly as told by your health care provider. This may include: ? 150 minutes of moderate-intensity or vigorous-intensity exercise each week. This could be brisk walking, biking, or water aerobics. ? Stretching and  doing strength exercises, such as yoga or weightlifting, at least 2 times a week.  Take medicines as told by your health care provider.  Do not use any products that contain nicotine or tobacco, such as cigarettes and e-cigarettes. If you need help quitting, ask your health care provider.  Work with a Social worker or diabetes educator to identify strategies to manage stress and any emotional and social challenges. Questions to ask a health care provider  Do I need to meet with a diabetes educator?  Do I need to meet with a dietitian?  What number can I call if I have questions?  When are the best times to check my blood glucose? Where to find more information:  American Diabetes Association: diabetes.org  Academy of Nutrition and Dietetics: www.eatright.[employer] of Diabetes and Digestive and Kidney Diseases (NIH): [employer] Summary  A healthy meal plan will help you control your blood glucose and maintain a healthy lifestyle.  Working with a diet and nutrition specialist (dietitian) can help you make a meal plan that is best for you.  Keep in mind that carbohydrates (carbs) and alcohol have immediate effects on your blood glucose levels. It is important to count carbs and to use alcohol carefully. This information is not intended to replace advice given to you by your health care provider. Make sure you discuss any questions you have with your health care provider. Document Released: 02/17/2005 Document Revised: 12/21/2016 Document Reviewed: 06/27/2016 Elsevier Interactive Patient Education  2019 Elsevier Inc.      Agustina Caroli, MD [facility]

## 2018-06-16 LAB — CBC WITH DIFFERENTIAL/PLATELET
BASOS: 1 %
Basophils Absolute: 0.1 10*3/uL (ref 0.0–0.2)
EOS (ABSOLUTE): 0.2 10*3/uL (ref 0.0–0.4)
EOS: 3 %
HEMATOCRIT: 48.4 % (ref 37.5–51.0)
Hemoglobin: 16.9 g/dL (ref 13.0–17.7)
IMMATURE GRANS (ABS): 0.1 10*3/uL (ref 0.0–0.1)
IMMATURE GRANULOCYTES: 1 %
LYMPHS: 35 %
Lymphocytes Absolute: 2.4 10*3/uL (ref 0.7–3.1)
MCH: 32.4 pg (ref 26.6–33.0)
MCHC: 34.9 g/dL (ref 31.5–35.7)
MCV: 93 fL (ref 79–97)
Monocytes Absolute: 0.5 10*3/uL (ref 0.1–0.9)
Monocytes: 8 %
NEUTROS PCT: 52 %
Neutrophils Absolute: 3.5 10*3/uL (ref 1.4–7.0)
Platelets: 255 10*3/uL (ref 150–450)
RBC: 5.22 x10E6/uL (ref 4.14–5.80)
RDW: 12.6 % (ref 11.6–15.4)
WBC: 6.8 10*3/uL (ref 3.4–10.8)

## 2018-06-16 LAB — LIPID PANEL
Chol/HDL Ratio: 3.7 ratio (ref 0.0–5.0)
Cholesterol, Total: 231 mg/dL — ABNORMAL HIGH (ref 100–199)
HDL: 63 mg/dL (ref >39)
LDL Calculated: 132 mg/dL — ABNORMAL HIGH (ref 0–99)
TRIGLYCERIDES: 180 mg/dL — AB (ref 0–149)
VLDL CHOLESTEROL CAL: 36 mg/dL (ref 5–40)

## 2018-06-16 LAB — COMPREHENSIVE METABOLIC PANEL
ALT: 18 IU/L (ref 0–44)
AST: 20 IU/L (ref 0–40)
Albumin/Globulin Ratio: 1.5 (ref 1.2–2.2)
Albumin: 4.2 g/dL (ref 3.6–4.8)
Alkaline Phosphatase: 62 IU/L (ref 39–117)
BUN/Creatinine Ratio: 23 (ref 10–24)
BUN: 18 mg/dL (ref 8–27)
Bilirubin Total: 0.3 mg/dL (ref 0.0–1.2)
CO2: 18 mmol/L — ABNORMAL LOW (ref 20–29)
Calcium: 9.6 mg/dL (ref 8.6–10.2)
Chloride: 106 mmol/L (ref 96–106)
Creatinine, Ser: 0.8 mg/dL (ref 0.76–1.27)
GFR calc Af Amer: 108 mL/min/{1.73_m2} (ref >59)
GFR calc non Af Amer: 94 mL/min/{1.73_m2} (ref >59)
GLUCOSE: 108 mg/dL — AB (ref 65–99)
Globulin, Total: 2.8 g/dL (ref 1.5–4.5)
Potassium: 4.8 mmol/L (ref 3.5–5.2)
Sodium: 139 mmol/L (ref 134–144)
Total Protein: 7 g/dL (ref 6.0–8.5)

## 2018-06-16 LAB — TSH: TSH: 1.12 u[IU]/mL (ref 0.450–4.500)

## 2018-06-16 LAB — MICROALBUMIN, URINE: Microalbumin, Urine: 755.2 ug/mL

## 2018-06-19 ENCOUNTER — Encounter: Payer: Self-pay | Admitting: Radiology

## 2018-07-03 ENCOUNTER — Telehealth: Payer: Self-pay | Admitting: Emergency Medicine

## 2018-07-03 NOTE — Telephone Encounter (Signed)
Called and spoke with pt regarding their appt scheduled with Dr. Mitchel Honour on 09/07/18. Due to Dr. Mitchel Honour being out of the office, pt was needing to be rescheduled. I was able to get pt rescheduled to 09/10/18 at 8:00. I advised of time, building number and late policy. Pt acknowledged.

## 2018-07-25 ENCOUNTER — Telehealth: Payer: Self-pay | Admitting: Emergency Medicine

## 2018-07-25 NOTE — Telephone Encounter (Signed)
Mychart message sent to pt about rescheduling their appointment with Dr Mitchel Honour on 11/30/18 due to provider being out of the office that day.

## 2018-09-07 ENCOUNTER — Encounter: Payer: Medicare Other | Admitting: Emergency Medicine

## 2018-09-10 ENCOUNTER — Telehealth: Payer: Self-pay | Admitting: Emergency Medicine

## 2018-09-10 ENCOUNTER — Encounter: Payer: Medicare Other | Admitting: Emergency Medicine

## 2018-09-10 NOTE — Telephone Encounter (Addendum)
09/10/2018 - ON 09/07/2018 APPOINTMENT CANCELLED VIA MY CHART. PATIENT SAID HE WANTED TO BE CALLED BACK TO RESCHEDULE HIS PHYSICAL FOR THE END OF MAY. I TRIED TO CALL HIM BUT HAD TO LEAVE A VOICE MAIL. IF HE CALLS BACK WE JUST NEED TO SCHEDULE A COMPLETE PHYSICAL WITH DR. Kittie Plater. AFTER HIS APPOINTMENT IS SCHEDULED [name] WILL CALL HIM FOR HIS ANNUAL MEDICARE WELLNESS VISIT. WE JUST NEED TO SEND HER A PHONE MESSAGE SO THAT SHE WILL KNOW. Gabriel Fernandez

## 2018-09-25 ENCOUNTER — Telehealth: Payer: Self-pay | Admitting: Emergency Medicine

## 2018-09-25 NOTE — Telephone Encounter (Signed)
09/25/2018 - PATIENT NEEDS TO HAVE HIS WELCOME TO MEDICARE PHYSICAL WITH DR. Kittie Plater. I CALLED TO SEE IF I COULD SCHEDULE THIS FOR HIM. I HAD TO LEAVE HIM A VOICE MAIL TO RETURN OUR CALL. JULIE SAID THIS IS THE TYPE OF PHYSICAL HE NEEDS SO HIS VISIT WILL NEED TO BE 40 MINUTES. [place]

## 2018-11-05 ENCOUNTER — Encounter: Payer: Medicare Other | Admitting: Emergency Medicine

## 2018-11-27 ENCOUNTER — Encounter: Payer: Self-pay | Admitting: Emergency Medicine

## 2018-11-27 ENCOUNTER — Other Ambulatory Visit: Payer: Self-pay

## 2018-11-27 ENCOUNTER — Ambulatory Visit (INDEPENDENT_AMBULATORY_CARE_PROVIDER_SITE_OTHER): Payer: Medicare Other | Admitting: Emergency Medicine

## 2018-11-27 VITALS — BP 156/84 | HR 80 | Temp 98.5°F | Ht 71.5 in | Wt 297.6 lb

## 2018-11-27 DIAGNOSIS — N400 Enlarged prostate without lower urinary tract symptoms: Secondary | ICD-10-CM | POA: Diagnosis not present

## 2018-11-27 DIAGNOSIS — Z23 Encounter for immunization: Secondary | ICD-10-CM

## 2018-11-27 DIAGNOSIS — E119 Type 2 diabetes mellitus without complications: Secondary | ICD-10-CM

## 2018-11-27 DIAGNOSIS — E785 Hyperlipidemia, unspecified: Secondary | ICD-10-CM

## 2018-11-27 NOTE — Patient Instructions (Addendum)
If you have lab work done today you will be contacted with your lab results within the next 2 weeks.  If you have not heard from [place] then please contact us. The fastest way to get your results is to register for My Chart.   IF you received an x-ray today, you will receive an invoice from [facility] Radiology. Please contact [facility] Radiology at [phone] with questions or concerns regarding your invoice.   IF you received labwork today, you will receive an invoice from [place]. Please contact [facility] at [phone] with questions or concerns regarding your invoice.   Our billing staff will not be able to assist you with questions regarding bills from these companies.  You will be contacted with the lab results as soon as they are available. The fastest way to get your results is to activate your My Chart account. Instructions are located on the last page of this paperwork. If you have not heard from [place] regarding the results in 2 weeks, please contact this office.      Health Maintenance After Age 36 After age 63, you are at a higher risk for certain long-term diseases and infections as well as injuries from falls. Falls are a major cause of broken bones and head injuries in people who are older than age 25. Getting regular preventive care can help to keep you healthy and well. Preventive care includes getting regular testing and making lifestyle changes as recommended by your health care provider. Talk with your health care provider about:  Which screenings and tests you should have. A screening is a test that checks for a disease when you have no symptoms.  A diet and exercise plan that is right for you. What should I know about screenings and tests to prevent falls? Screening and testing are the best ways to find a health problem early. Early diagnosis and treatment give you the best chance of managing medical conditions that are common after age 57. Certain conditions and  lifestyle choices may make you more likely to have a fall. Your health care provider may recommend:  Regular vision checks. Poor vision and conditions such as cataracts can make you more likely to have a fall. If you wear glasses, make sure to get your prescription updated if your vision changes.  Medicine review. Work with your health care provider to regularly review all of the medicines you are taking, including over-the-counter medicines. Ask your health care provider about any side effects that may make you more likely to have a fall. Tell your health care provider if any medicines that you take make you feel dizzy or sleepy.  Osteoporosis screening. Osteoporosis is a condition that causes the bones to get weaker. This can make the bones weak and cause them to break more easily.  Blood pressure screening. Blood pressure changes and medicines to control blood pressure can make you feel dizzy.  Strength and balance checks. Your health care provider may recommend certain tests to check your strength and balance while standing, walking, or changing positions.  Foot health exam. Foot pain and numbness, as well as not wearing proper footwear, can make you more likely to have a fall.  Depression screening. You may be more likely to have a fall if you have a fear of falling, feel emotionally low, or feel unable to do activities that you used to do.  Alcohol use screening. Using too much alcohol can affect your balance and may make you more likely  to have a fall. What actions can I take to lower my risk of falls? General instructions  Talk with your health care provider about your risks for falling. Tell your health care provider if: ? You fall. Be sure to tell your health care provider about all falls, even ones that seem minor. ? You feel dizzy, sleepy, or off-balance.  Take over-the-counter and prescription medicines only as told by your health care provider. These include any  supplements.  Eat a healthy diet and maintain a healthy weight. A healthy diet includes low-fat dairy products, low-fat (lean) meats, and fiber from whole grains, beans, and lots of fruits and vegetables. Home safety  Remove any tripping hazards, such as rugs, cords, and clutter.  Install safety equipment such as grab bars in bathrooms and safety rails on stairs.  Keep rooms and walkways well-lit. Activity   Follow a regular exercise program to stay fit. This will help you maintain your balance. Ask your health care provider what types of exercise are appropriate for you.  If you need a cane or walker, use it as recommended by your health care provider.  Wear supportive shoes that have nonskid soles. Lifestyle  Do not drink alcohol if your health care provider tells you not to drink.  If you drink alcohol, limit how much you have: ? 0-1 drink a day for women. ? 0-2 drinks a day for men.  Be aware of how much alcohol is in your drink. In the [place] one drink equals one typical bottle of beer (12 oz), one-half glass of wine (5 oz), or one shot of hard liquor (1 oz).  Do not use any products that contain nicotine or tobacco, such as cigarettes and e-cigarettes. If you need help quitting, ask your health care provider. Summary  Having a healthy lifestyle and getting preventive care can help to protect your health and wellness after age 70.  Screening and testing are the best way to find a health problem early and help you avoid having a fall. Early diagnosis and treatment give you the best chance for managing medical conditions that are more common for people who are older than age 51.  Falls are a major cause of broken bones and head injuries in people who are older than age 11. Take precautions to prevent a fall at home.  Work with your health care provider to learn what changes you can make to improve your health and wellness and to prevent falls. This information is not intended  to replace advice given to you by your health care provider. Make sure you discuss any questions you have with your health care provider. Document Released: 04/05/2017 Document Revised: 04/05/2017 Document Reviewed: 04/05/2017 Elsevier Interactive Patient Education  2019 [employer].

## 2018-11-27 NOTE — Progress Notes (Signed)
Gabriel Fernandez 63 y.o.   Chief Complaint  Patient presents with  . Medicare Wellness    welcome to medicare     HISTORY OF PRESENT ILLNESS: This is a 63 y.o. male with history of diabetes and dyslipidemia.  On medications.  Smoker. States he has not been physically active or eating very well since the pandemic started.  HPI   Prior to Admission medications   Medication Sig Start Date End Date Taking? Authorizing Provider  atorvastatin (LIPITOR) 20 MG tablet TAKE 1 TABLET BY MOUTH EVERY DAY. 06/15/18  Yes Franklin, Ines Bloomer, MD  metFORMIN (GLUCOPHAGE) 500 MG tablet TAKE 1 TABLET BY MOUTH EVERY DAY WITH BREAKFAST 06/15/18  Yes Meagan Spease, Ines Bloomer, MD  tamsulosin [facility]) 0.4 MG CAPS capsule Take 1 capsule (0.4 mg total) by mouth daily after breakfast. 06/15/18  Yes Aime Carreras, Ines Bloomer, MD    No Known Allergies  Patient Active Problem List   Diagnosis Date Noted  . Smoker 06/08/2013  . Other and unspecified hyperlipidemia 06/08/2013  . Gout 06/08/2013  . Severe obesity (BMI >= 40) ([place]) 06/08/2013    Past Medical History:  Diagnosis Date  . Allergy   . Diabetes mellitus without complication ([place])   . Gout   . Hyperlipidemia   . Sleep apnea    Not on CPAP    Past Surgical History:  Procedure Laterality Date  . COLONOSCOPY      Social History   Socioeconomic History  . Marital status: Single    Spouse name: Not on file  . Number of children: Not on file  . Years of education: Not on file  . Highest education level: Not on file  Occupational History  . Not on file  Social Needs  . Financial resource strain: Not on file  . Food insecurity    Worry: Not on file    Inability: Not on file  . Transportation needs    Medical: Not on file    Non-medical: Not on file  Tobacco Use  . Smoking status: Current Every Day Smoker    Packs/day: 1.00    Years: 35.00    Pack years: 35.00  . Smokeless tobacco: Never Used  Substance and Sexual Activity  . Alcohol  use: Yes    Alcohol/week: 6.0 standard drinks    Types: 4 Glasses of wine, 2 Cans of beer per week  . Drug use: No  . Sexual activity: Not on file  Lifestyle  . Physical activity    Days per week: Not on file    Minutes per session: Not on file  . Stress: Not on file  Relationships  . Social Herbalist on phone: Not on file    Gets together: Not on file    Attends religious service: Not on file    Active member of club or organization: Not on file    Attends meetings of clubs or organizations: Not on file    Relationship status: Not on file  . Intimate partner violence    Fear of current or ex partner: Not on file    Emotionally abused: Not on file    Physically abused: Not on file    Forced sexual activity: Not on file  Other Topics Concern  . Not on file  Social History Narrative  . Not on file    Family History  Problem Relation Age of Onset  . Cancer Mother   . Ovarian cancer Mother   . Stomach  cancer Mother   . Colon cancer Neg Hx   . Esophageal cancer Neg Hx   . Rectal cancer Neg Hx      Review of Systems  Constitutional: Negative.  Negative for chills, fever and malaise/fatigue.  HENT: Negative for congestion and sore throat.   Eyes: Negative.   Respiratory: Negative.  Negative for cough and shortness of breath.   Cardiovascular: Negative.  Negative for chest pain.  Gastrointestinal: Negative for abdominal pain, diarrhea, nausea and vomiting.  Genitourinary: Negative.  Negative for dysuria.  Musculoskeletal: Negative.   Skin: Negative.  Negative for rash.  Neurological: Negative.  Negative for dizziness and headaches.  Endo/Heme/Allergies: Negative.   All other systems reviewed and are negative.  Vitals:   11/27/18 1332  BP: (!) 156/84  Pulse: 80  Temp: 98.5 F (36.9 C)  SpO2: 92%     Physical Exam Vitals signs reviewed.  Constitutional:      Appearance: Normal appearance.  HENT:     Head: Normocephalic and atraumatic.     Nose:  Nose normal.     Mouth/Throat:     Mouth: Mucous membranes are moist.     Pharynx: Oropharynx is clear.  Eyes:     Extraocular Movements: Extraocular movements intact.     Conjunctiva/sclera: Conjunctivae normal.     Pupils: Pupils are equal, round, and reactive to light.  Cardiovascular:     Rate and Rhythm: Normal rate and regular rhythm.     Pulses: Normal pulses.     Heart sounds: Normal heart sounds.  Pulmonary:     Effort: Pulmonary effort is normal.     Breath sounds: Normal breath sounds.  Abdominal:     Palpations: Abdomen is soft.     Tenderness: There is no abdominal tenderness.  Musculoskeletal: Normal range of motion.     Comments: Positive varicose veins to lower extremities  Skin:    General: Skin is warm and dry.     Capillary Refill: Capillary refill takes less than 2 seconds.  Neurological:     General: No focal deficit present.     Mental Status: He is alert and oriented to person, place, and time.  Psychiatric:        Mood and Affect: Mood normal.        Behavior: Behavior normal.      ASSESSMENT & PLAN: Jathan was seen today for medicare wellness.  Diagnoses and all orders for this visit:  Type 2 diabetes mellitus without complication, without long-term current use of insulin (HCC) -     Comprehensive metabolic panel -     Hemoglobin A1c -     CBC with Differential/Platelet  Hyperlipidemia, unspecified hyperlipidemia type -     Lipid panel -     CBC with Differential/Platelet  Benign prostatic hyperplasia, unspecified whether lower urinary tract symptoms present  Need for pneumococcal vaccination -     Pneumococcal polysaccharide vaccine 23-valent greater than or equal to 2yo subcutaneous/IM    Patient Instructions       If you have lab work done today you will be contacted with your lab results within the next 2 weeks.  If you have not heard from [place] then please contact us. The fastest way to get your results is to register for My  Chart.   IF you received an x-ray today, you will receive an invoice from [facility]. Please contact [facility] at [phone] with questions or concerns regarding your invoice.   IF you received  labwork today, you will receive an invoice from [facility]. Please contact [facility] at [phone] with questions or concerns regarding your invoice.   Our billing staff will not be able to assist you with questions regarding bills from these companies.  You will be contacted with the lab results as soon as they are available. The fastest way to get your results is to activate your My Chart account. Instructions are located on the last page of this paperwork. If you have not heard from [place] regarding the results in 2 weeks, please contact this office.      Health Maintenance After Age 36 After age 38, you are at a higher risk for certain long-term diseases and infections as well as injuries from falls. Falls are a major cause of broken bones and head injuries in people who are older than age 45. Getting regular preventive care can help to keep you healthy and well. Preventive care includes getting regular testing and making lifestyle changes as recommended by your health care provider. Talk with your health care provider about:  Which screenings and tests you should have. A screening is a test that checks for a disease when you have no symptoms.  A diet and exercise plan that is right for you. What should I know about screenings and tests to prevent falls? Screening and testing are the best ways to find a health problem early. Early diagnosis and treatment give you the best chance of managing medical conditions that are common after age 63. Certain conditions and lifestyle choices may make you more likely to have a fall. Your health care provider may recommend:  Regular vision checks. Poor vision and conditions such as cataracts can make you more likely to have a fall. If you wear  glasses, make sure to get your prescription updated if your vision changes.  Medicine review. Work with your health care provider to regularly review all of the medicines you are taking, including over-the-counter medicines. Ask your health care provider about any side effects that may make you more likely to have a fall. Tell your health care provider if any medicines that you take make you feel dizzy or sleepy.  Osteoporosis screening. Osteoporosis is a condition that causes the bones to get weaker. This can make the bones weak and cause them to break more easily.  Blood pressure screening. Blood pressure changes and medicines to control blood pressure can make you feel dizzy.  Strength and balance checks. Your health care provider may recommend certain tests to check your strength and balance while standing, walking, or changing positions.  Foot health exam. Foot pain and numbness, as well as not wearing proper footwear, can make you more likely to have a fall.  Depression screening. You may be more likely to have a fall if you have a fear of falling, feel emotionally low, or feel unable to do activities that you used to do.  Alcohol use screening. Using too much alcohol can affect your balance and may make you more likely to have a fall. What actions can I take to lower my risk of falls? General instructions  Talk with your health care provider about your risks for falling. Tell your health care provider if: ? You fall. Be sure to tell your health care provider about all falls, even ones that seem minor. ? You feel dizzy, sleepy, or off-balance.  Take over-the-counter and prescription medicines only as told by your health care provider. These include any supplements.  Eat a  healthy diet and maintain a healthy weight. A healthy diet includes low-fat dairy products, low-fat (lean) meats, and fiber from whole grains, beans, and lots of fruits and vegetables. Home safety  Remove any  tripping hazards, such as rugs, cords, and clutter.  Install safety equipment such as grab bars in bathrooms and safety rails on stairs.  Keep rooms and walkways well-lit. Activity   Follow a regular exercise program to stay fit. This will help you maintain your balance. Ask your health care provider what types of exercise are appropriate for you.  If you need a cane or walker, use it as recommended by your health care provider.  Wear supportive shoes that have nonskid soles. Lifestyle  Do not drink alcohol if your health care provider tells you not to drink.  If you drink alcohol, limit how much you have: ? 0-1 drink a day for women. ? 0-2 drinks a day for men.  Be aware of how much alcohol is in your drink. In the [place] one drink equals one typical bottle of beer (12 oz), one-half glass of wine (5 oz), or one shot of hard liquor (1 oz).  Do not use any products that contain nicotine or tobacco, such as cigarettes and e-cigarettes. If you need help quitting, ask your health care provider. Summary  Having a healthy lifestyle and getting preventive care can help to protect your health and wellness after age 59.  Screening and testing are the best way to find a health problem early and help you avoid having a fall. Early diagnosis and treatment give you the best chance for managing medical conditions that are more common for people who are older than age 7.  Falls are a major cause of broken bones and head injuries in people who are older than age 25. Take precautions to prevent a fall at home.  Work with your health care provider to learn what changes you can make to improve your health and wellness and to prevent falls. This information is not intended to replace advice given to you by your health care provider. Make sure you discuss any questions you have with your health care provider. Document Released: 04/05/2017 Document Revised: 04/05/2017 Document Reviewed: 04/05/2017  Elsevier Interactive Patient Education  2019 Elsevier Inc.      Agustina Caroli, MD [facility]

## 2018-11-28 ENCOUNTER — Encounter: Payer: Self-pay | Admitting: Emergency Medicine

## 2018-11-28 LAB — CBC WITH DIFFERENTIAL/PLATELET
Basophils Absolute: 0.1 10*3/uL (ref 0.0–0.2)
Basos: 1 %
EOS (ABSOLUTE): 0.2 10*3/uL (ref 0.0–0.4)
Eos: 3 %
Hematocrit: 45.2 % (ref 37.5–51.0)
Hemoglobin: 15.9 g/dL (ref 13.0–17.7)
Immature Grans (Abs): 0.1 10*3/uL (ref 0.0–0.1)
Immature Granulocytes: 1 %
Lymphocytes Absolute: 1.5 10*3/uL (ref 0.7–3.1)
Lymphs: 28 %
MCH: 32.4 pg (ref 26.6–33.0)
MCHC: 35.2 g/dL (ref 31.5–35.7)
MCV: 92 fL (ref 79–97)
Monocytes Absolute: 0.4 10*3/uL (ref 0.1–0.9)
Monocytes: 7 %
Neutrophils Absolute: 3.2 10*3/uL (ref 1.4–7.0)
Neutrophils: 60 %
Platelets: 246 10*3/uL (ref 150–450)
RBC: 4.9 x10E6/uL (ref 4.14–5.80)
RDW: 13.4 % (ref 11.6–15.4)
WBC: 5.4 10*3/uL (ref 3.4–10.8)

## 2018-11-28 LAB — LIPID PANEL
Chol/HDL Ratio: 3 ratio (ref 0.0–5.0)
Cholesterol, Total: 188 mg/dL (ref 100–199)
HDL: 63 mg/dL (ref >39)
LDL Calculated: 102 mg/dL — ABNORMAL HIGH (ref 0–99)
Triglycerides: 113 mg/dL (ref 0–149)
VLDL Cholesterol Cal: 23 mg/dL (ref 5–40)

## 2018-11-28 LAB — COMPREHENSIVE METABOLIC PANEL
ALT: 31 IU/L (ref 0–44)
AST: 20 IU/L (ref 0–40)
Albumin/Globulin Ratio: 1.4 (ref 1.2–2.2)
Albumin: 4.2 g/dL (ref 3.8–4.8)
Alkaline Phosphatase: 68 IU/L (ref 39–117)
BUN/Creatinine Ratio: 20 (ref 10–24)
BUN: 17 mg/dL (ref 8–27)
Bilirubin Total: 0.4 mg/dL (ref 0.0–1.2)
CO2: 22 mmol/L (ref 20–29)
Calcium: 10 mg/dL (ref 8.6–10.2)
Chloride: 101 mmol/L (ref 96–106)
Creatinine, Ser: 0.87 mg/dL (ref 0.76–1.27)
GFR calc Af Amer: 105 mL/min/{1.73_m2} (ref >59)
GFR calc non Af Amer: 91 mL/min/{1.73_m2} (ref >59)
Globulin, Total: 2.9 g/dL (ref 1.5–4.5)
Glucose: 108 mg/dL — ABNORMAL HIGH (ref 65–99)
Potassium: 5.4 mmol/L — ABNORMAL HIGH (ref 3.5–5.2)
Sodium: 136 mmol/L (ref 134–144)
Total Protein: 7.1 g/dL (ref 6.0–8.5)

## 2018-11-28 LAB — HEMOGLOBIN A1C
Est. average glucose Bld gHb Est-mCnc: 126 mg/dL
Hgb A1c MFr Bld: 6 % — ABNORMAL HIGH (ref 4.8–5.6)

## 2018-11-30 ENCOUNTER — Ambulatory Visit: Payer: Medicare Other | Admitting: Emergency Medicine

## 2019-03-13 ENCOUNTER — Encounter: Payer: Self-pay | Admitting: *Deleted

## 2019-03-14 IMAGING — US US ABDOMINAL AORTA SCREENING AAA
1 series · 14 of 15 positions shown · non-contrast
Comparison: None.

CLINICAL DATA: 61-year-old male smoker at risk for abdominal aortic
aneurysm.

EXAM:
ULTRASOUND OF ABDOMINAL AORTA
TECHNIQUE: Ultrasound examination of the abdominal aorta was performed to
evaluate for abdominal aortic aneurysm.

[Series 1: us abdominal aorta screening aaa · 0.36mm/px · 14 of 15 slices shown]
[im 1/15]
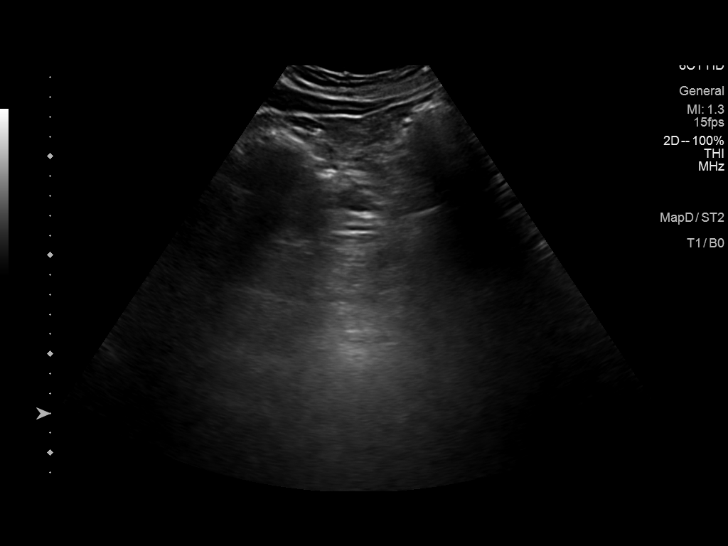
[im 2/15]
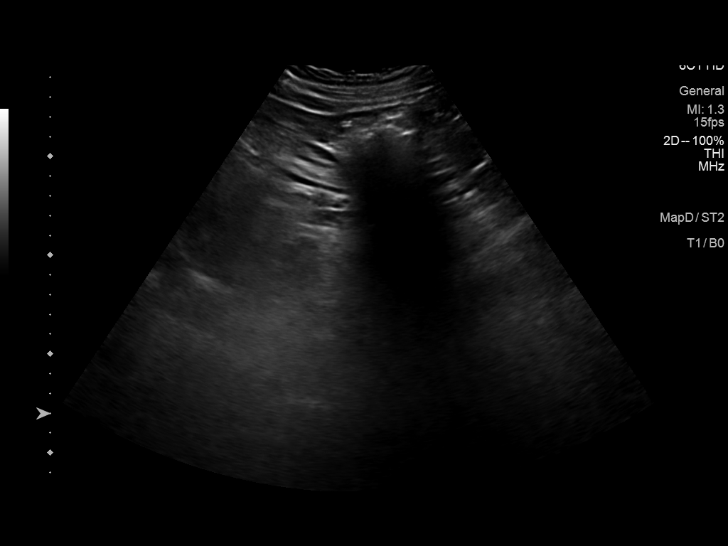
[im 3/15]
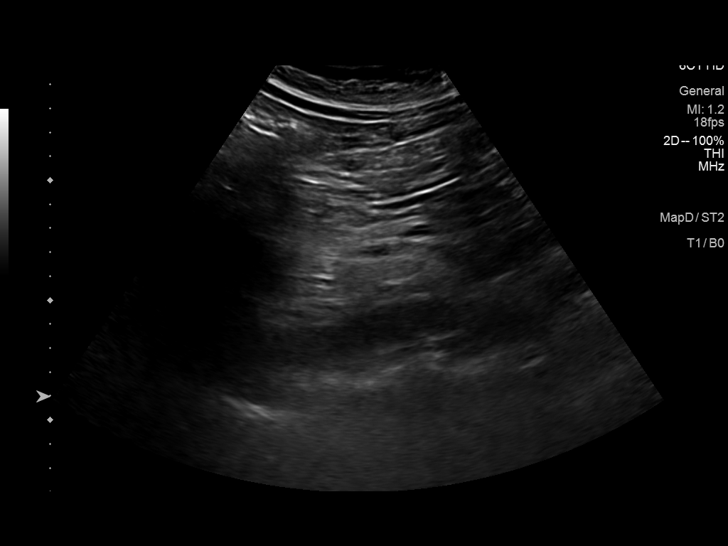
[im 4/15]
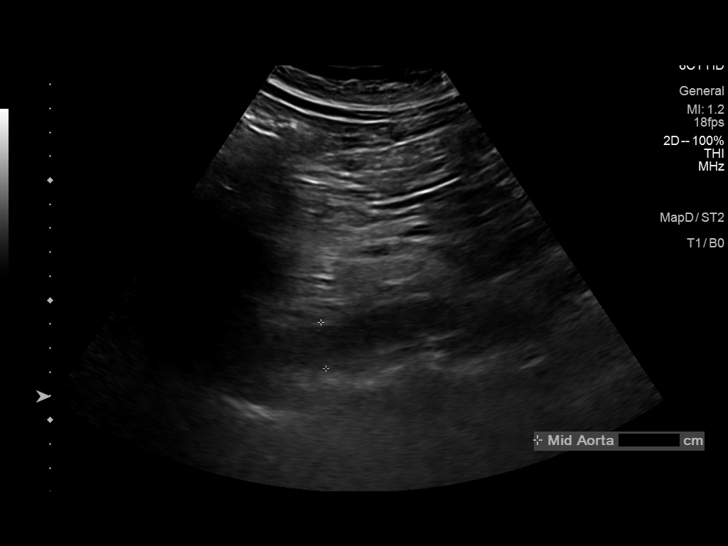
[im 5/15]
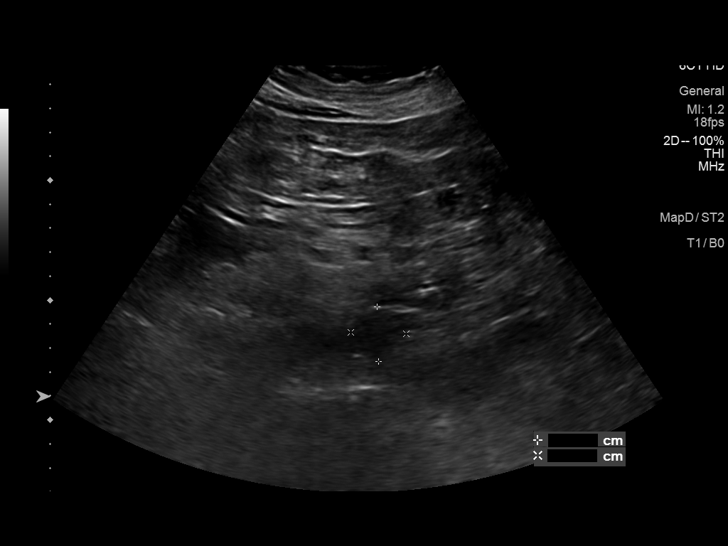
[im 6/15]
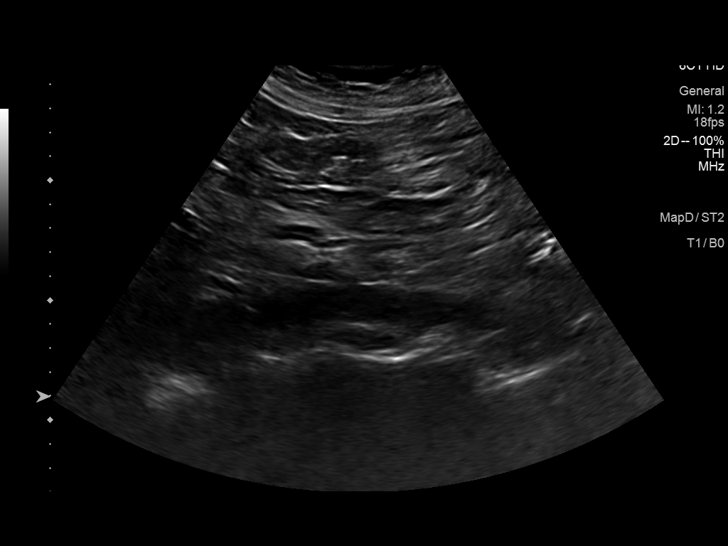
[im 7/15]
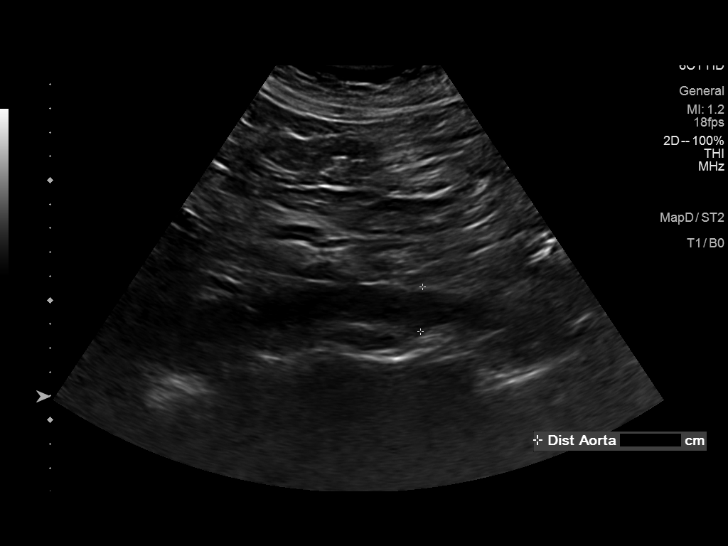
[im 9/15]
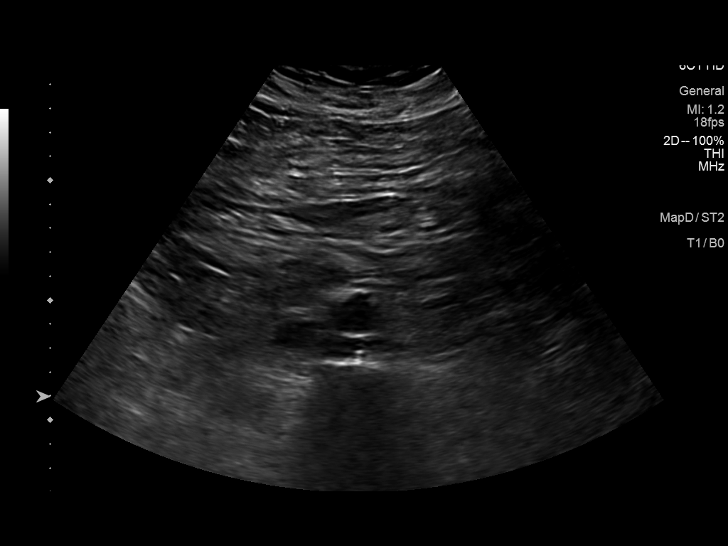
[im 10/15]
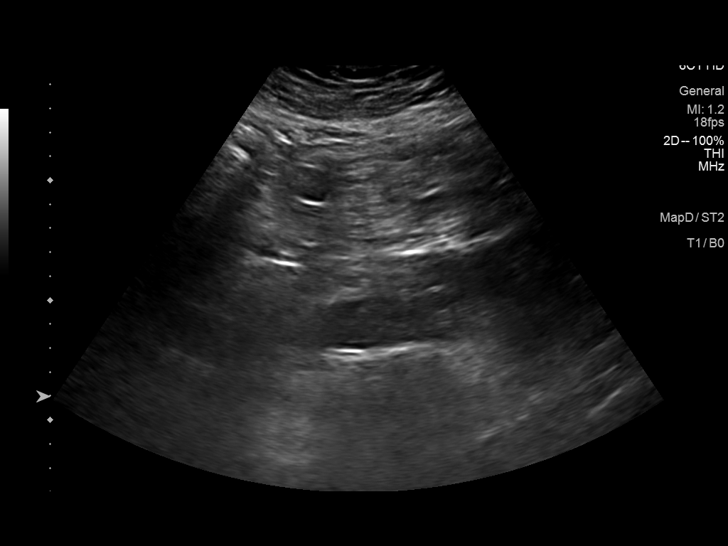
[im 11/15]
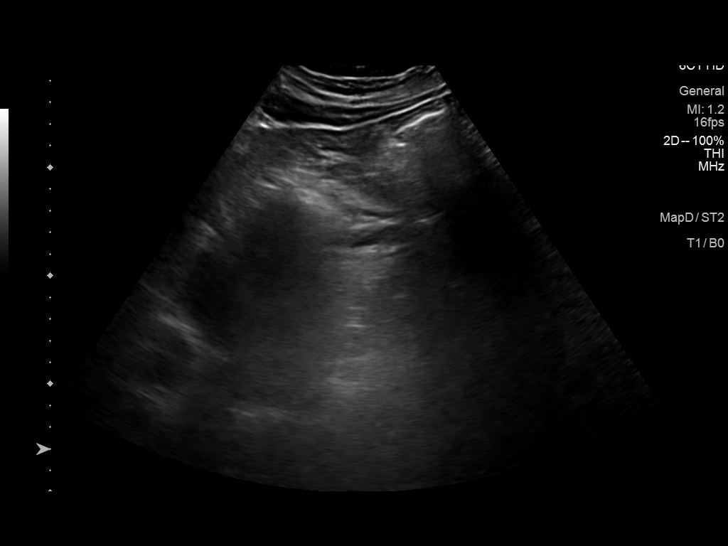
[im 12/15]
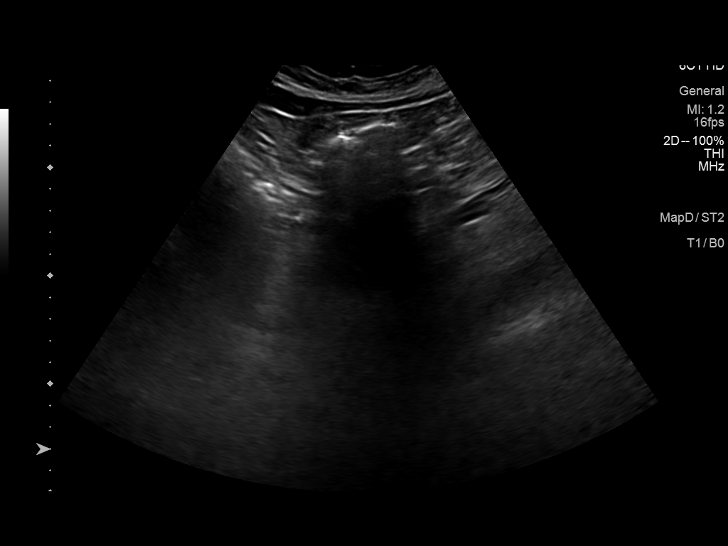
[im 13/15]
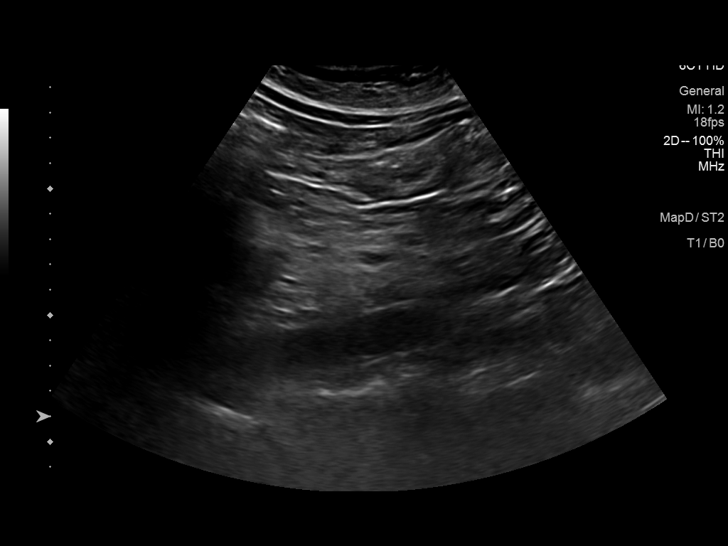
[im 14/15]
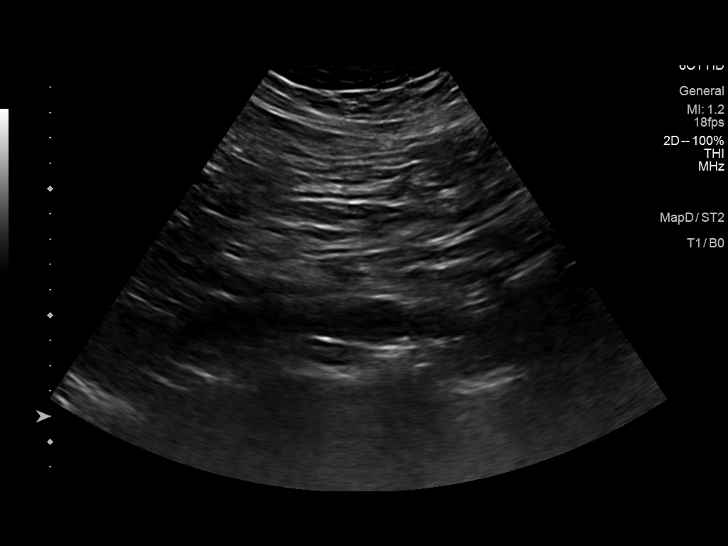
[im 15/15]
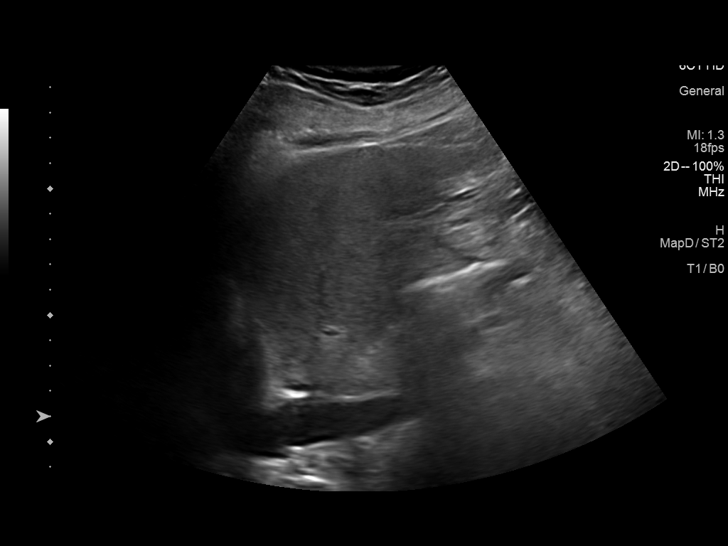

[14 of 15 positions shown; findings below may reference images not displayed]

FINDINGS: Abdominal aortic measurements as follows:

Proximal:  Obscured by bowel gas

Mid:  2.3 cm

Distal:  1.9 cm
IMPRESSION: The proximal visceral abdominal aorta is obscured by bowel gas.
However, there is no evidence of aneurysmal dilatation in the
infrarenal aorta.

## 2019-05-24 ENCOUNTER — Other Ambulatory Visit: Payer: Self-pay | Admitting: Emergency Medicine

## 2019-05-24 DIAGNOSIS — N400 Enlarged prostate without lower urinary tract symptoms: Secondary | ICD-10-CM

## 2019-05-24 DIAGNOSIS — E785 Hyperlipidemia, unspecified: Secondary | ICD-10-CM

## 2019-05-24 NOTE — Telephone Encounter (Signed)
Forwarding medication refill requests to the clinical pool for review. 

## 2019-06-03 ENCOUNTER — Ambulatory Visit: Payer: Medicare Other | Admitting: Emergency Medicine

## 2019-06-04 ENCOUNTER — Encounter: Payer: Self-pay | Admitting: Emergency Medicine

## 2019-06-10 ENCOUNTER — Other Ambulatory Visit: Payer: Self-pay | Admitting: Emergency Medicine

## 2019-06-10 DIAGNOSIS — E119 Type 2 diabetes mellitus without complications: Secondary | ICD-10-CM

## 2019-06-10 NOTE — Telephone Encounter (Signed)
Requested medication (s) are due for refill today: yes  Requested medication (s) are on the active medication list:  yes  Last refill:  03/12/2019  Future visit scheduled: no  Notes to clinic:   No valid encounter within last 6 months Review for refill  Requested Prescriptions  Pending Prescriptions Disp Refills   metFORMIN (GLUCOPHAGE) 500 MG tablet [Pharmacy Med Name: METFORMIN HCL 500 MG TABLET] 90 tablet 3    Sig: TAKE 1 TABLET BY [place]      Endocrinology:  Diabetes - Biguanides Failed - 06/10/2019  1:49 AM      Failed - HBA1C is between 0 and 7.9 and within 180 days    Hgb A1c MFr Bld  Date Value Ref Range Status  11/27/2018 6.0 (H) 4.8 - 5.6 % Final    Comment:             Prediabetes: 5.7 - 6.4          Diabetes: >6.4          Glycemic control for adults with diabetes: <7.0           Failed - Valid encounter within last 6 months    Recent Outpatient Visits           6 months ago Type 2 diabetes mellitus without complication, without long-term current use of insulin ([place])   Primary Care at [facility], [place], MD   12 months ago Type 2 diabetes mellitus without complication, without long-term current use of insulin [facility])   Primary Care at [facility], Ines Bloomer, MD   1 year ago Prediabetes   Primary Care at [facility], Audrie Lia, PA-C   2 years ago Annual physical exam   Primary Care at [place], [place], [place]   2 years ago Neck symptom   Primary Care at [place], [place], [place] in normal range and within 360 days    Creat  Date Value Ref Range Status  04/01/2016 0.89 0.70 - 1.25 mg/dL Final    Comment:      For patients > or = 66 years of age: The upper reference limit for Creatinine is approximately 13% higher for people identified as African-American.      Creatinine, Ser  Date Value Ref Range Status  11/27/2018 0.87 0.76 - 1.27 mg/dL Final          Passed -  eGFR in normal range and within 360 days    GFR, Est African American  Date Value Ref Range Status  04/01/2016 >89 >=60 mL/min Final   GFR calc Af Amer  Date Value Ref Range Status  11/27/2018 105 >59 mL/min/1.73 Final   GFR, Est Non African American  Date Value Ref Range Status  04/01/2016 >89 >=60 mL/min Final   GFR calc non Af Amer  Date Value Ref Range Status  11/27/2018 91 >59 mL/min/1.73 Final

## 2019-08-18 ENCOUNTER — Other Ambulatory Visit: Payer: Self-pay | Admitting: Emergency Medicine

## 2019-08-18 DIAGNOSIS — N400 Enlarged prostate without lower urinary tract symptoms: Secondary | ICD-10-CM

## 2019-08-18 DIAGNOSIS — E785 Hyperlipidemia, unspecified: Secondary | ICD-10-CM

## 2019-08-18 NOTE — Telephone Encounter (Signed)
Requested Prescriptions  Pending Prescriptions Disp Refills  . tamsulosin (FLOMAX) 0.4 MG CAPS capsule [Pharmacy Med Name: TAMSULOSIN HCL 0.4 MG CAPSULE] 90 capsule 1    Sig: TAKE 1 CAPSULE (0.4 MG TOTAL) BY MOUTH DAILY AFTER BREAKFAST.     Urology: Alpha-Adrenergic Blocker Failed - 08/18/2019  9:54 AM      Failed - Last BP in normal range    BP Readings from Last 1 Encounters:  11/27/18 (!) 156/84         Passed - Valid encounter within last 12 months    Recent Outpatient Visits          8 months ago Type 2 diabetes mellitus without complication, without long-term current use of insulin [facility])   Primary Care at [place], [place], MD   1 year ago Type 2 diabetes mellitus without complication, without long-term current use of insulin [facility])   Primary Care at [facility], Ines Bloomer, MD   1 year ago Prediabetes   Primary Care at [facility], Audrie Lia, PA-C   2 years ago Annual physical exam   Primary Care at [place], [place], [place]   2 years ago Neck symptom   Primary Care at [place], [place], [place]             . atorvastatin (LIPITOR) 20 MG tablet [Pharmacy Med Name: ATORVASTATIN 20 MG TABLET] 90 tablet 0    Sig: TAKE 1 TABLET BY MOUTH EVERY DAY.     Cardiovascular:  Antilipid - Statins Failed - 08/18/2019  9:54 AM      Failed - LDL in normal range and within 360 days    LDL Calculated  Date Value Ref Range Status  11/27/2018 102 (H) 0 - 99 mg/dL Final         Passed - Total Cholesterol in normal range and within 360 days    Cholesterol, Total  Date Value Ref Range Status  11/27/2018 188 100 - 199 mg/dL Final         Passed - HDL in normal range and within 360 days    HDL  Date Value Ref Range Status  11/27/2018 63 >39 mg/dL Final         Passed - Triglycerides in normal range and within 360 days    Triglycerides  Date Value Ref Range Status  11/27/2018 113 0 - 149 mg/dL Final         Passed - Patient is not pregnant     Passed - Valid encounter within last 12 months    Recent Outpatient Visits          8 months ago Type 2 diabetes mellitus without complication, without long-term current use of insulin ([place])   Primary Care at [place], [place], MD   1 year ago Type 2 diabetes mellitus without complication, without long-term current use of insulin [facility])   Primary Care at [facility], Ines Bloomer, MD   1 year ago Prediabetes   Primary Care at [facility], Audrie Lia, PA-C   2 years ago Annual physical exam   Primary Care at [place], [place], [place]   2 years ago Neck symptom   Primary Care at [place], [place], [place]

## 2019-09-12 ENCOUNTER — Other Ambulatory Visit: Payer: Self-pay | Admitting: Emergency Medicine

## 2019-09-12 DIAGNOSIS — E119 Type 2 diabetes mellitus without complications: Secondary | ICD-10-CM

## 2019-09-12 NOTE — Telephone Encounter (Signed)
Requested  medications are  due for refill today yes  Requested medications are on the active medication list yes  Last refill 1/6  Future visit scheduled no  Notes to clinic - failed protocol of visit within 6 months

## 2019-11-15 ENCOUNTER — Other Ambulatory Visit: Payer: Self-pay | Admitting: Emergency Medicine

## 2019-11-15 DIAGNOSIS — E785 Hyperlipidemia, unspecified: Secondary | ICD-10-CM

## 2019-12-12 ENCOUNTER — Other Ambulatory Visit: Payer: Self-pay | Admitting: Emergency Medicine

## 2019-12-12 DIAGNOSIS — E785 Hyperlipidemia, unspecified: Secondary | ICD-10-CM

## 2019-12-12 NOTE — Telephone Encounter (Signed)
Please schedule f/u appt for any further refills

## 2019-12-12 NOTE — Telephone Encounter (Signed)
Called pt his don,t wanted mede a app 12/12/19

## 2019-12-12 NOTE — Telephone Encounter (Signed)
Requested medication (s) are due for refill today: yes  Requested medication (s) are on the active medication list: yes  Last refill:  11/15/2019  Future visit scheduled: no  Notes to clinic:  Patient has been given courtesy refill No appointment schedule  Please advise    Requested Prescriptions  Pending Prescriptions Disp Refills   atorvastatin (LIPITOR) 20 MG tablet [Pharmacy Med Name: ATORVASTATIN 20 MG TABLET] 30 tablet 0    Sig: TAKE 1 TABLET BY MOUTH EVERY DAY.      Cardiovascular:  Antilipid - Statins Failed - 12/12/2019  1:31 PM      Failed - Total Cholesterol in normal range and within 360 days    Cholesterol, Total  Date Value Ref Range Status  11/27/2018 188 100 - 199 mg/dL Final          Failed - LDL in normal range and within 360 days    LDL Calculated  Date Value Ref Range Status  11/27/2018 102 (H) 0 - 99 mg/dL Final          Failed - HDL in normal range and within 360 days    HDL  Date Value Ref Range Status  11/27/2018 63 >39 mg/dL Final          Failed - Triglycerides in normal range and within 360 days    Triglycerides  Date Value Ref Range Status  11/27/2018 113 0 - 149 mg/dL Final          Failed - Valid encounter within last 12 months    Recent Outpatient Visits           1 year ago Type 2 diabetes mellitus without complication, without long-term current use of insulin ([place])   Primary Care at [place], [place], MD   1 year ago Type 2 diabetes mellitus without complication, without long-term current use of insulin [facility])   [facility] at [facility], Ines Bloomer, MD   2 years ago Prediabetes   Primary Care at [facility], Audrie Lia, PA-C   2 years ago Annual physical exam   Primary Care at [place], [place], [place]   3 years ago Neck symptom   Primary Care at [place], [place], [place] - Patient is not pregnant

## 2019-12-13 ENCOUNTER — Other Ambulatory Visit: Payer: Self-pay | Admitting: Emergency Medicine

## 2019-12-13 DIAGNOSIS — E119 Type 2 diabetes mellitus without complications: Secondary | ICD-10-CM

## 2019-12-13 NOTE — Telephone Encounter (Signed)
Please help scheduled pt for f/u appt she has not been seen since 11/27/18 and has NO SHOWED her last scheduled appt on 06/03/19.  We will need to do labs and know how his diabetes is doing.   After appointment is scheduled we can send another [place] refill since he has already gotten one.

## 2019-12-13 NOTE — Telephone Encounter (Signed)
Requested  medications are  due for refill today yes  Requested medications are on the active medication list yes  Last refill 4/8  Last visit more than one year  Future visit scheduled no  Notes to clinic Failed protocol due to no visit within 6 months

## 2019-12-13 NOTE — Telephone Encounter (Signed)
Called pt his said he will  call  to made a app 07/16/19

## 2019-12-16 ENCOUNTER — Telehealth: Payer: Self-pay | Admitting: *Deleted

## 2019-12-16 NOTE — Telephone Encounter (Signed)
On 12/12/2019, faxed denied request for Atorvastatin to [facility]. Patient had courtesy refill sent already and needs to schedule an appointment for additional refills.

## 2020-01-02 ENCOUNTER — Telehealth: Payer: Self-pay | Admitting: Emergency Medicine

## 2020-01-02 ENCOUNTER — Other Ambulatory Visit: Payer: Self-pay | Admitting: Emergency Medicine

## 2020-01-02 DIAGNOSIS — E119 Type 2 diabetes mellitus without complications: Secondary | ICD-10-CM

## 2020-01-02 MED ORDER — METFORMIN HCL 500 MG PO TABS: 500.0000 mg | ORAL_TABLET | Freq: Every day | ORAL | 0 refills | Status: DC

## 2020-01-02 MED ORDER — METFORMIN HCL 500 MG PO TABS: ORAL_TABLET | ORAL | 0 refills | Status: DC

## 2020-01-02 NOTE — Telephone Encounter (Signed)
Pt is needing refill on  What is the name of the medication? metFORMIN (GLUCOPHAGE) 500 MG tablet [[id number]   Have you contacted your pharmacy to request a refill? Y  Which pharmacy would you like this sent to?  metFORMIN (GLUCOPHAGE) 500 MG tablet [[id number]   Patient notified that their request is being sent to the clinical staff for review and that they should receive a call once it is complete. If they do not receive a call within 72 hours they can check with their pharmacy or our office.

## 2020-01-02 NOTE — Telephone Encounter (Signed)
Rx has been sent to the pharmacy

## 2020-01-03 ENCOUNTER — Other Ambulatory Visit: Payer: Self-pay

## 2020-01-03 ENCOUNTER — Ambulatory Visit (HOSPITAL_COMMUNITY)
Admission: EM | Admit: 2020-01-03 | Discharge: 2020-01-03 | Disposition: A | Discharge status: Home / Self Care | Payer: PRIVATE HEALTH INSURANCE

## 2020-01-03 ENCOUNTER — Encounter (HOSPITAL_COMMUNITY): Payer: Self-pay

## 2020-01-03 DIAGNOSIS — M79605 Pain in left leg: Secondary | ICD-10-CM

## 2020-01-03 DIAGNOSIS — M7989 Other specified soft tissue disorders: Secondary | ICD-10-CM | POA: Diagnosis not present

## 2020-01-03 DIAGNOSIS — I739 Peripheral vascular disease, unspecified: Secondary | ICD-10-CM

## 2020-01-03 DIAGNOSIS — M79604 Pain in right leg: Secondary | ICD-10-CM

## 2020-01-03 DIAGNOSIS — I83813 Varicose veins of bilateral lower extremities with pain: Secondary | ICD-10-CM

## 2020-01-03 NOTE — ED Notes (Signed)
Patient scheduled for DVT study Monday 01/06/2020 @ 9 am   [facility]

## 2020-01-03 NOTE — Discharge Instructions (Signed)
Please make sure that you start wearing your compression stockings to help with pain related to your varicose veins.  We are going to obtain an ultrasound, you can leave after our nursing staff schedule this for you.  Make sure you make an appointment with your vascular surgeon as soon as possible.  We will call you as soon as we can once we get the ultrasound report. Please just use Tylenol at a dose of 500mg -650mg  once every 6 hours as needed for your aches, pains, fevers. Do not use any nonsteroidal anti-inflammatories (NSAIDs) like ibuprofen, Motrin, naproxen, Aleve, etc. which are all available over-the-counter.

## 2020-01-03 NOTE — ED Triage Notes (Signed)
Pt reports having R leg swelling since Sunday. Swelling is noticed throughout the day but more so in the evening. "my leg feels tight". Pt also reports having L calf pain x1 week.  "I have this white stuff on my ankles. Unsure of what it is, even after taking a shower, it is still there".  bila legs are taut, slight redness not warm to touch.

## 2020-01-03 NOTE — ED Provider Notes (Signed)
[place]   MRN: [id number] DOB: [birth year]  Subjective:   Gabriel Fernandez is a 64 y.o. male presenting for 5-day history of persistent bilateral lower leg pain.  Patient states that his right lower leg was swollen, has been improving in the past few days.  States that both of his legs feel tight, left worse than right.  Has noticed skin changes of his lower legs over time.  Has a history of peripheral vascular disease, has been seen by a vascular surgeon and states that he had procedures done for his veins.  He is supposed to be wearing compression stockings but does not.  Patient was a heavy smoker, cut back in the last 6 months but is picking it back up.  Denies redness, warmth of his calves, history of DVT.  No current facility-administered medications for this encounter.  Current Outpatient Medications:  .  atorvastatin (LIPITOR) 20 MG tablet, TAKE 1 TABLET BY MOUTH EVERY DAY., Disp: 30 tablet, Rfl: 0 .  metFORMIN (GLUCOPHAGE) 500 MG tablet, Take 1 tablet (500 mg total) by mouth daily with breakfast., Disp: 30 tablet, Rfl: 0 .  tamsulosin (FLOMAX) 0.4 MG CAPS capsule, TAKE 1 CAPSULE (0.4 MG TOTAL) BY MOUTH DAILY AFTER BREAKFAST., Disp: 90 capsule, Rfl: 1   No Known Allergies  Past Medical History:  Diagnosis Date  . Allergy   . Diabetes mellitus without complication ([place])   . Gout   . Hyperlipidemia   . Sleep apnea    Not on CPAP     Past Surgical History:  Procedure Laterality Date  . COLONOSCOPY      Family History  Problem Relation Age of Onset  . Cancer Mother   . Ovarian cancer Mother   . Stomach cancer Mother   . Colon cancer Neg Hx   . Esophageal cancer Neg Hx   . Rectal cancer Neg Hx     Social History   Tobacco Use  . Smoking status: Current Every Day Smoker    Packs/day: 1.00    Years: 35.00    Pack years: 35.00  . Smokeless tobacco: Never Used  Substance Use Topics  . Alcohol use: Yes    Alcohol/week: 6.0 standard drinks    Types: 4  Glasses of wine, 2 Cans of beer per week  . Drug use: No    ROS   Objective:   Vitals: BP (!) 137/62   Pulse 75   Temp 98.4 F (36.9 C)   Resp 16   Ht 6' (1.829 m)   Wt (!) 218 lb (98.9 kg)   SpO2 98%   BMI 29.57 kg/m   Physical Exam Constitutional:      General: He is not in acute distress.    Appearance: Normal appearance. He is well-developed and normal weight. He is not ill-appearing, toxic-appearing or diaphoretic.  HENT:     Head: Normocephalic and atraumatic.     Right Ear: External ear normal.     Left Ear: External ear normal.     Nose: Nose normal.     Mouth/Throat:     Pharynx: Oropharynx is clear.  Eyes:     General: No scleral icterus.       Right eye: No discharge.        Left eye: No discharge.     Extraocular Movements: Extraocular movements intact.     Pupils: Pupils are equal, round, and reactive to light.  Cardiovascular:     Rate and Rhythm: Normal rate.  Pulmonary:     Effort: Pulmonary effort is normal.  Musculoskeletal:        General: Swelling (right lower leg proximally, 1+) and tenderness (of both lower extremities extending into the foot) present.     Cervical back: Normal range of motion.     Right lower leg: No edema.     Left lower leg: No edema.       Legs:     Comments: Positive Homans' sign bilaterally.  No erythema, warmth of either lower leg.  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  Skin:    General: Skin is warm and dry.  Neurological:     Mental Status: He is alert and oriented to person, place, and time.  Psychiatric:        Mood and Affect: Mood normal.        Behavior: Behavior normal.        Thought Content: Thought content normal.        Judgment: Judgment normal.     Assessment and Plan :   PDMP not reviewed this encounter.  1. Right leg swelling   2. Bilateral lower extremity pain   3. Peripheral vascular disease ([place])   4. Varicose veins of both lower extremities with pain      Emphasized need to start wearing compression socks again, quit smoking.  Low suspicion for DVT but given his exam findings we will pursue DVT ultrasound today.  Schedule appointment for follow-up with vascular surgery.  Use Tylenol for pain. Counseled patient on potential for adverse effects with medications prescribed/recommended today, ER and return-to-clinic precautions discussed, patient verbalized understanding.    [name], PA-C 01/03/20 1255

## 2020-01-06 ENCOUNTER — Other Ambulatory Visit (HOSPITAL_COMMUNITY): Payer: Self-pay | Admitting: Urgent Care

## 2020-01-06 ENCOUNTER — Other Ambulatory Visit: Payer: Self-pay

## 2020-01-06 ENCOUNTER — Ambulatory Visit (HOSPITAL_COMMUNITY)
Admission: RE | Admit: 2020-01-06 | Discharge: 2020-01-06 | Disposition: A | Discharge status: Home / Self Care | Payer: Medicare Other | Source: Ambulatory Visit | Attending: Urgent Care | Admitting: Urgent Care

## 2020-01-06 DIAGNOSIS — M7989 Other specified soft tissue disorders: Secondary | ICD-10-CM

## 2020-01-06 DIAGNOSIS — M79606 Pain in leg, unspecified: Secondary | ICD-10-CM

## 2020-01-09 ENCOUNTER — Encounter: Payer: Self-pay | Admitting: Gastroenterology

## 2020-01-26 ENCOUNTER — Other Ambulatory Visit: Payer: Self-pay | Admitting: Emergency Medicine

## 2020-01-26 DIAGNOSIS — E119 Type 2 diabetes mellitus without complications: Secondary | ICD-10-CM

## 2020-01-26 NOTE — Telephone Encounter (Signed)
Requested medication (s) are due for refill today: yes  Requested medication (s) are on the active medication list: yes  Last refill:  01/02/20  Future visit scheduled: no  Notes to clinic:  [facility] does not make appts for this practice   Requested Prescriptions  Pending Prescriptions Disp Refills   metFORMIN (GLUCOPHAGE) 500 MG tablet [Pharmacy Med Name: METFORMIN HCL 500 MG TABLET] 30 tablet 0    Sig: TAKE 1 TABLET BY MOUTH EVERY DAY WITH BREAKFAST      Endocrinology:  Diabetes - Biguanides Failed - 01/26/2020  8:33 AM      Failed - Cr in normal range and within 360 days    Creat  Date Value Ref Range Status  04/01/2016 0.89 0.70 - 1.25 mg/dL Final    Comment:      For patients > or = 66 years of age: The upper reference limit for Creatinine is approximately 13% higher for people identified as African-American.      Creatinine, Ser  Date Value Ref Range Status  11/27/2018 0.87 0.76 - 1.27 mg/dL Final          Failed - HBA1C is between 0 and 7.9 and within 180 days    Hgb A1c MFr Bld  Date Value Ref Range Status  11/27/2018 6.0 (H) 4.8 - 5.6 % Final    Comment:             Prediabetes: 5.7 - 6.4          Diabetes: >6.4          Glycemic control for adults with diabetes: <7.0           Failed - eGFR in normal range and within 360 days    GFR, Est African American  Date Value Ref Range Status  04/01/2016 >89 >=60 mL/min Final   GFR calc Af Amer  Date Value Ref Range Status  11/27/2018 105 >59 mL/min/1.73 Final   GFR, Est Non African American  Date Value Ref Range Status  04/01/2016 >89 >=60 mL/min Final   GFR calc non Af Amer  Date Value Ref Range Status  11/27/2018 91 >59 mL/min/1.73 Final          Failed - Valid encounter within last 6 months    Recent Outpatient Visits           1 year ago Type 2 diabetes mellitus without complication, without long-term current use of insulin ([facility])   [facility] Sagardia, Miguel Jose, MD   1 year ago  Type 2 diabetes mellitus without complication, without long-term current use of insulin (H[facility])   [facility] Sagardia, Miguel Jose, MD   2 years ago Prediabetes   [facility] Clark, Michael L, PA-C   2 years ago Annual physical exam   [facility] English, Stephanie D, PA   3 years ago Neck symptom   [facility] English, Stephanie D, PA                   

## 2020-01-27 NOTE — Telephone Encounter (Signed)
Patient have not been seen in over a year. Please schedule appt for f/u and refills. No refills without office visit

## 2020-01-27 NOTE — Telephone Encounter (Signed)
01/27/2020 - PATIENT REQUESTING A REFILL ON HIS METFORMIN HCL 500 MG. I HAVE SCHEDULED HIS FOLLOW-UP WITH DR. Kittie Plater ON Thursday (01/30/2020) AT 10:40 am. PATIENT HAS ENOUGH MEDICATION TO LAST HIM UNTIL THEN. I DO NOT NEED TO ROUTE BACK TO THE CLINICAL TEAM AT THIS TIME. Gabriel Fernandez

## 2020-01-30 ENCOUNTER — Other Ambulatory Visit: Payer: Self-pay

## 2020-01-30 ENCOUNTER — Encounter: Payer: Self-pay | Admitting: Emergency Medicine

## 2020-01-30 ENCOUNTER — Ambulatory Visit (INDEPENDENT_AMBULATORY_CARE_PROVIDER_SITE_OTHER): Payer: Medicare Other | Admitting: Emergency Medicine

## 2020-01-30 VITALS — BP 153/74 | HR 66 | Temp 97.5°F | Resp 16 | Ht 71.0 in | Wt 306.0 lb

## 2020-01-30 DIAGNOSIS — E1169 Type 2 diabetes mellitus with other specified complication: Secondary | ICD-10-CM | POA: Diagnosis not present

## 2020-01-30 DIAGNOSIS — Z6841 Body Mass Index (BMI) 40.0 and over, adult: Secondary | ICD-10-CM

## 2020-01-30 DIAGNOSIS — N400 Enlarged prostate without lower urinary tract symptoms: Secondary | ICD-10-CM

## 2020-01-30 DIAGNOSIS — I739 Peripheral vascular disease, unspecified: Secondary | ICD-10-CM

## 2020-01-30 DIAGNOSIS — E785 Hyperlipidemia, unspecified: Secondary | ICD-10-CM

## 2020-01-30 DIAGNOSIS — I872 Venous insufficiency (chronic) (peripheral): Secondary | ICD-10-CM | POA: Insufficient documentation

## 2020-01-30 DIAGNOSIS — F17209 Nicotine dependence, unspecified, with unspecified nicotine-induced disorders: Secondary | ICD-10-CM | POA: Insufficient documentation

## 2020-01-30 DIAGNOSIS — E1165 Type 2 diabetes mellitus with hyperglycemia: Secondary | ICD-10-CM | POA: Diagnosis not present

## 2020-01-30 LAB — COMPREHENSIVE METABOLIC PANEL
ALT: 25 IU/L (ref 0–44)
AST: 18 IU/L (ref 0–40)
Albumin/Globulin Ratio: 1.4 (ref 1.2–2.2)
Albumin: 3.9 g/dL (ref 3.8–4.8)
Alkaline Phosphatase: 79 IU/L (ref 48–121)
BUN/Creatinine Ratio: 12 (ref 10–24)
BUN: 11 mg/dL (ref 8–27)
Bilirubin Total: 0.4 mg/dL (ref 0.0–1.2)
CO2: 22 mmol/L (ref 20–29)
Calcium: 9.6 mg/dL (ref 8.6–10.2)
Chloride: 99 mmol/L (ref 96–106)
Creatinine, Ser: 0.92 mg/dL (ref 0.76–1.27)
GFR calc Af Amer: 100 mL/min/{1.73_m2} (ref >59)
GFR calc non Af Amer: 86 mL/min/{1.73_m2} (ref >59)
Globulin, Total: 2.8 g/dL (ref 1.5–4.5)
Glucose: 112 mg/dL — ABNORMAL HIGH (ref 65–99)
Potassium: 5.2 mmol/L (ref 3.5–5.2)
Sodium: 132 mmol/L — ABNORMAL LOW (ref 134–144)
Total Protein: 6.7 g/dL (ref 6.0–8.5)

## 2020-01-30 LAB — HEMOGLOBIN A1C
Est. average glucose Bld gHb Est-mCnc: 128 mg/dL
Hgb A1c MFr Bld: 6.1 % — ABNORMAL HIGH (ref 4.8–5.6)

## 2020-01-30 LAB — LIPID PANEL
Chol/HDL Ratio: 2.6 ratio (ref 0.0–5.0)
Cholesterol, Total: 157 mg/dL (ref 100–199)
HDL: 60 mg/dL (ref >39)
LDL Chol Calc (NIH): 80 mg/dL (ref 0–99)
Triglycerides: 93 mg/dL (ref 0–149)
VLDL Cholesterol Cal: 17 mg/dL (ref 5–40)

## 2020-01-30 MED ORDER — ATORVASTATIN CALCIUM 20 MG PO TABS: 20.0000 mg | ORAL_TABLET | Freq: Every day | ORAL | 3 refills | Status: DC

## 2020-01-30 MED ORDER — TAMSULOSIN HCL 0.4 MG PO CAPS: 0.4000 mg | ORAL_CAPSULE | Freq: Every day | ORAL | 3 refills | Status: DC

## 2020-01-30 MED ORDER — METFORMIN HCL 500 MG PO TABS: 500.0000 mg | ORAL_TABLET | Freq: Two times a day (BID) | ORAL | 3 refills | Status: DC

## 2020-01-30 NOTE — Progress Notes (Signed)
Gabriel Fernandez 64 y.o.   Chief Complaint  Patient presents with  . Medication Refill    Atovastatin, Metformin and Tamsulosin  . Leg Pain    both for 3 weeks    HISTORY OF PRESENT ILLNESS: This is a 64 y.o. male complaining of bilateral leg pain for the past several weeks. Went to urgent care center recently and also had ultrasound of the legs that showed no DVT. Has history of diabetes and dyslipidemia. Smoker. Lab Results  Component Value Date   HGBA1C 6.0 (H) 11/27/2018   BP Readings from Last 3 Encounters:  01/30/20 (!) 153/74  01/03/20 (!) 137/62  11/27/18 (!) 156/84   Lab Results  Component Value Date   CHOL 188 11/27/2018   HDL 63 11/27/2018   LDLCALC 102 (H) 11/27/2018   TRIG 113 11/27/2018   CHOLHDL 3.0 11/27/2018   The 10-year ASCVD risk score Mikey Bussing DC Jr., et al., 2013) is: 36.1%   Values used to calculate the score:     Age: 64 years     Sex: Male     Is Non-Hispanic African American: No     Diabetic: Yes     Tobacco smoker: Yes     Systolic Blood Pressure: 735 mmHg     Is BP treated: No     HDL Cholesterol: 63 mg/dL     Total Cholesterol: 188 mg/dL Lab Results  Component Value Date   CREATININE 0.87 11/27/2018   BUN 17 11/27/2018   NA 136 11/27/2018   K 5.4 (H) 11/27/2018   CL 101 11/27/2018   CO2 22 11/27/2018    HPI   Prior to Admission medications   Medication Sig Start Date End Date Taking? Authorizing Provider  atorvastatin (LIPITOR) 20 MG tablet TAKE 1 TABLET BY MOUTH EVERY DAY. 11/15/19  Yes Cobie Leidner, Ines Bloomer, MD  metFORMIN (GLUCOPHAGE) 500 MG tablet Take 1 tablet (500 mg total) by mouth daily with breakfast. 01/02/20  Yes Plumer Mittelstaedt, Ines Bloomer, MD  tamsulosin (FLOMAX) 0.4 MG CAPS capsule TAKE 1 CAPSULE (0.4 MG TOTAL) BY MOUTH DAILY AFTER BREAKFAST. 08/18/19  Yes SagardiaInes Bloomer, MD    No Known Allergies  Patient Active Problem List   Diagnosis Date Noted  . Type 2 diabetes mellitus with hyperglycemia, without long-term  current use of insulin ([place]) 01/30/2020  . Smoker 06/08/2013  . Other and unspecified hyperlipidemia 06/08/2013  . Gout 06/08/2013  . Severe obesity (BMI >= 40) ([place]) 06/08/2013    Past Medical History:  Diagnosis Date  . Allergy   . Diabetes mellitus without complication ([place])   . Gout   . Hyperlipidemia   . Sleep apnea    Not on CPAP    Past Surgical History:  Procedure Laterality Date  . COLONOSCOPY      Social History   Socioeconomic History  . Marital status: Single    Spouse name: Not on file  . Number of children: Not on file  . Years of education: Not on file  . Highest education level: Not on file  Occupational History  . Not on file  Tobacco Use  . Smoking status: Current Every Day Smoker    Packs/day: 1.00    Years: 35.00    Pack years: 35.00  . Smokeless tobacco: Never Used  Substance and Sexual Activity  . Alcohol use: Yes    Alcohol/week: 6.0 standard drinks    Types: 4 Glasses of wine, 2 Cans of beer per week  . Drug use: No  .  Sexual activity: Not on file  Other Topics Concern  . Not on file  Social History Narrative  . Not on file   Social Determinants of Health   Financial Resource Strain:   . Difficulty of Paying Living Expenses: Not on file  Food Insecurity:   . Worried About Charity fundraiser in the Last Year: Not on file  . Ran Out of Food in the Last Year: Not on file  Transportation Needs:   . Lack of Transportation (Medical): Not on file  . Lack of Transportation (Non-Medical): Not on file  Physical Activity:   . Days of Exercise per Week: Not on file  . Minutes of Exercise per Session: Not on file  Stress:   . Feeling of Stress : Not on file  Social Connections:   . Frequency of Communication with Friends and Family: Not on file  . Frequency of Social Gatherings with Friends and Family: Not on file  . Attends Religious Services: Not on file  . Active Member of Clubs or Organizations: Not on file  . Attends Theatre manager Meetings: Not on file  . Marital Status: Not on file  Intimate Partner Violence:   . Fear of Current or Ex-Partner: Not on file  . Emotionally Abused: Not on file  . Physically Abused: Not on file  . Sexually Abused: Not on file    Family History  Problem Relation Age of Onset  . Cancer Mother   . Ovarian cancer Mother   . Stomach cancer Mother   . Colon cancer Neg Hx   . Esophageal cancer Neg Hx   . Rectal cancer Neg Hx      Review of Systems  Constitutional: Negative.  Negative for chills and fever.  HENT: Negative.  Negative for congestion and sore throat.   Respiratory: Negative.  Negative for cough and shortness of breath.   Cardiovascular: Negative for chest pain and palpitations.  Gastrointestinal: Negative.  Negative for abdominal pain, diarrhea, nausea and vomiting.  Genitourinary: Negative.  Negative for dysuria and hematuria.  Skin: Negative.  Negative for rash.  Neurological: Negative.  Negative for dizziness and headaches.  All other systems reviewed and are negative.   Today's Vitals   01/30/20 1102  BP: (!) 153/74  Pulse: 66  Resp: 16  Temp: (!) 97.5 F (36.4 C)  TempSrc: Temporal  SpO2: 93%  Weight: (!) 306 lb (138.8 kg)  Height: 5\' 11"  (1.803 m)   Body mass index is 42.68 kg/m.  Physical Exam Constitutional:      Appearance: He is obese.  HENT:     Head: Normocephalic.  Eyes:     Extraocular Movements: Extraocular movements intact.     Pupils: Pupils are equal, round, and reactive to light.  Cardiovascular:     Rate and Rhythm: Normal rate and regular rhythm.     Pulses: Normal pulses.     Heart sounds: Normal heart sounds.  Pulmonary:     Effort: Pulmonary effort is normal.     Breath sounds: Normal breath sounds.  Musculoskeletal:        General: Normal range of motion.     Cervical back: No tenderness.     Comments: Lower extremities: Skin changes secondary to chronic venous insufficiency and mild edema, warm to touch,  excellent capillary refill, diminished peripheral pulses, nontender  Lymphadenopathy:     Cervical: No cervical adenopathy.  Skin:    General: Skin is warm and dry.  Capillary Refill: Capillary refill takes less than 2 seconds.  Neurological:     General: No focal deficit present.     Mental Status: He is alert and oriented to person, place, and time.  Psychiatric:        Mood and Affect: Mood normal.        Behavior: Behavior normal.    A total of 30 minutes was spent with the patient, greater than 50% of which was in counseling/coordination of care regarding multiple chronic medical problems and their treatments, chronic venous insufficiency and chronic leg pain, need for vascular surgery evaluation, review of most recent leg ultrasound results, review of most recent office visit notes, review of most recent urgent care visit report, review of most recent blood work results, smoking and smoking cessation need, cardiovascular risks associated with smoking, prognosis and need for follow-up.   ASSESSMENT & PLAN: Raynard was seen today for medication refill and leg pain.  Diagnoses and all orders for this visit:  Dyslipidemia associated with type 2 diabetes mellitus ([place]) -     Comprehensive metabolic panel -     Hemoglobin A1c -     Lipid panel -     Microalbumin, urine -     metFORMIN (GLUCOPHAGE) 500 MG tablet; Take 1 tablet (500 mg total) by mouth 2 (two) times daily with a meal.  Hyperlipidemia, unspecified hyperlipidemia type -     atorvastatin (LIPITOR) 20 MG tablet; Take 1 tablet (20 mg total) by mouth daily.  Type 2 diabetes mellitus with hyperglycemia, without long-term current use of insulin (HCC)  Benign prostatic hyperplasia, unspecified whether lower urinary tract symptoms present -     tamsulosin (FLOMAX) 0.4 MG CAPS capsule; Take 1 capsule (0.4 mg total) by mouth daily after breakfast.  Morbid obesity ([place])  Body mass index (BMI) of 40.1-44.9 in adult  [facility])  PVD (peripheral vascular disease) ([place]) -     Ambulatory referral to Vascular Surgery  Chronic venous insufficiency -     Ambulatory referral to Vascular Surgery  Tobacco use disorder, continuous    Patient Instructions       If you have lab work done today you will be contacted with your lab results within the next 2 weeks.  If you have not heard from [place] then please contact us. The fastest way to get your results is to register for My Chart.   IF you received an x-ray today, you will receive an invoice from [facility]. Please contact [facility] at [phone] with questions or concerns regarding your invoice.   IF you received labwork today, you will receive an invoice from [facility]. Please contact [facility] at [phone] with questions or concerns regarding your invoice.   Our billing staff will not be able to assist you with questions regarding bills from these companies.  You will be contacted with the lab results as soon as they are available. The fastest way to get your results is to activate your My Chart account. Instructions are located on the last page of this paperwork. If you have not heard from [place] regarding the results in 2 weeks, please contact this office.     Chronic Venous Insufficiency Chronic venous insufficiency is a condition where the leg veins cannot effectively pump blood from the legs to the heart. This happens when the vein walls are either stretched, weakened, or damaged, or when the valves inside the vein are damaged. With the right treatment, you should be able to continue with an  active life. This condition is also called venous stasis. What are the causes? Common causes of this condition include:  High blood pressure inside the veins (venous hypertension).  Sitting or standing too long, causing increased blood pressure in the leg veins.  A blood clot that blocks blood flow in a vein (deep vein thrombosis,  DVT).  Inflammation of a vein (phlebitis) that causes a blood clot to form.  Tumors in the pelvis that cause blood to back up. What increases the risk? The following factors may make you more likely to develop this condition:  Having a family history of this condition.  Obesity.  Pregnancy.  Living without enough regular physical activity or exercise (sedentary lifestyle).  Smoking.  Having a job that requires long periods of standing or sitting in one place.  Being a certain age. Women in their 82s and 92s and men in their 80s are more likely to develop this condition. What are the signs or symptoms? Symptoms of this condition include:  Veins that are enlarged, bulging, or twisted (varicose veins).  Skin breakdown or ulcers.  Reddened skin or dark discoloration of skin on the leg between the knee and ankle.  Brown, smooth, tight, and painful skin just above the ankle, usually on the inside of the leg (lipodermatosclerosis).  Swelling of the legs. How is this diagnosed? This condition may be diagnosed based on:  Your medical history.  A physical exam.  Tests, such as: ? A procedure that creates an image of a blood vessel and nearby organs and provides information about blood flow through the blood vessel (duplex ultrasound). ? A procedure that tests blood flow (plethysmography). ? A procedure that looks at the veins using X-ray and dye (venogram). How is this treated? The goals of treatment are to help you return to an active life and to minimize pain or disability. Treatment depends on the severity of your condition, and it may include:  Wearing compression stockings. These can help relieve symptoms and help prevent your condition from getting worse. However, they do not cure the condition.  Sclerotherapy. This procedure involves an injection of a solution that shrinks damaged veins.  Surgery. This may involve: ? Removing a diseased vein (vein  stripping). ? Cutting off blood flow through the vein (laser ablation surgery). ? Repairing or reconstructing a valve within the affected vein. Follow these instructions at home:      Wear compression stockings as told by your health care provider. These stockings help to prevent blood clots and reduce swelling in your legs.  Take over-the-counter and prescription medicines only as told by your health care provider.  Stay active by exercising, walking, or doing different activities. Ask your health care provider what activities are safe for you and how much exercise you need.  Drink enough fluid to keep your urine pale yellow.  Do not use any products that contain nicotine or tobacco, such as cigarettes, e-cigarettes, and chewing tobacco. If you need help quitting, ask your health care provider.  Keep all follow-up visits as told by your health care provider. This is important. Contact a health care provider if you:  Have redness, swelling, or more pain in the affected area.  See a red streak or line that goes up or down from the affected area.  Have skin breakdown or skin loss in the affected area, even if the breakdown is small.  Get an injury in the affected area. Get help right away if:  You get an injury  and an open wound in the affected area.  You have: ? Severe pain that does not get better with medicine. ? Sudden numbness or weakness in the foot or ankle below the affected area. ? Trouble moving your foot or ankle. ? A fever. ? Worse or persistent symptoms. ? Chest pain. ? Shortness of breath. Summary  Chronic venous insufficiency is a condition where the leg veins cannot effectively pump blood from the legs to the heart.  Chronic venous insufficiency occurs when the vein walls become stretched, weakened, or damaged, or when valves within the vein are damaged.  Treatment depends on how severe your condition is. It often involves wearing compression stockings and  may involve having a procedure.  Make sure you stay active by exercising, walking, or doing different activities. Ask your health care provider what activities are safe for you and how much exercise you need. This information is not intended to replace advice given to you by your health care provider. Make sure you discuss any questions you have with your health care provider. Document Revised: 02/13/2018 Document Reviewed: 02/13/2018 Elsevier Patient Education  2020 [employer]      Agustina Caroli, MD [facility]

## 2020-01-30 NOTE — Patient Instructions (Addendum)
If you have lab work done today you will be contacted with your lab results within the next 2 weeks.  If you have not heard from [place] then please contact us. The fastest way to get your results is to register for My Chart.   IF you received an x-ray today, you will receive an invoice from [facility]. Please contact [facility] at [phone] with questions or concerns regarding your invoice.   IF you received labwork today, you will receive an invoice from [place]. Please contact [facility] at [phone] with questions or concerns regarding your invoice.   Our billing staff will not be able to assist you with questions regarding bills from these companies.  You will be contacted with the lab results as soon as they are available. The fastest way to get your results is to activate your My Chart account. Instructions are located on the last page of this paperwork. If you have not heard from [place] regarding the results in 2 weeks, please contact this office.     Chronic Venous Insufficiency Chronic venous insufficiency is a condition where the leg veins cannot effectively pump blood from the legs to the heart. This happens when the vein walls are either stretched, weakened, or damaged, or when the valves inside the vein are damaged. With the right treatment, you should be able to continue with an active life. This condition is also called venous stasis. What are the causes? Common causes of this condition include:  High blood pressure inside the veins (venous hypertension).  Sitting or standing too long, causing increased blood pressure in the leg veins.  A blood clot that blocks blood flow in a vein (deep vein thrombosis, DVT).  Inflammation of a vein (phlebitis) that causes a blood clot to form.  Tumors in the pelvis that cause blood to back up. What increases the risk? The following factors may make you more likely to develop this condition:  Having a family  history of this condition.  Obesity.  Pregnancy.  Living without enough regular physical activity or exercise (sedentary lifestyle).  Smoking.  Having a job that requires long periods of standing or sitting in one place.  Being a certain age. Women in their 18s and 26s and men in their 76s are more likely to develop this condition. What are the signs or symptoms? Symptoms of this condition include:  Veins that are enlarged, bulging, or twisted (varicose veins).  Skin breakdown or ulcers.  Reddened skin or dark discoloration of skin on the leg between the knee and ankle.  Brown, smooth, tight, and painful skin just above the ankle, usually on the inside of the leg (lipodermatosclerosis).  Swelling of the legs. How is this diagnosed? This condition may be diagnosed based on:  Your medical history.  A physical exam.  Tests, such as: ? A procedure that creates an image of a blood vessel and nearby organs and provides information about blood flow through the blood vessel (duplex ultrasound). ? A procedure that tests blood flow (plethysmography). ? A procedure that looks at the veins using X-ray and dye (venogram). How is this treated? The goals of treatment are to help you return to an active life and to minimize pain or disability. Treatment depends on the severity of your condition, and it may include:  Wearing compression stockings. These can help relieve symptoms and help prevent your condition from getting worse. However, they do not cure the condition.  Sclerotherapy. This procedure involves an  injection of a solution that shrinks damaged veins.  Surgery. This may involve: ? Removing a diseased vein (vein stripping). ? Cutting off blood flow through the vein (laser ablation surgery). ? Repairing or reconstructing a valve within the affected vein. Follow these instructions at home:      Wear compression stockings as told by your health care provider. These  stockings help to prevent blood clots and reduce swelling in your legs.  Take over-the-counter and prescription medicines only as told by your health care provider.  Stay active by exercising, walking, or doing different activities. Ask your health care provider what activities are safe for you and how much exercise you need.  Drink enough fluid to keep your urine pale yellow.  Do not use any products that contain nicotine or tobacco, such as cigarettes, e-cigarettes, and chewing tobacco. If you need help quitting, ask your health care provider.  Keep all follow-up visits as told by your health care provider. This is important. Contact a health care provider if you:  Have redness, swelling, or more pain in the affected area.  See a red streak or line that goes up or down from the affected area.  Have skin breakdown or skin loss in the affected area, even if the breakdown is small.  Get an injury in the affected area. Get help right away if:  You get an injury and an open wound in the affected area.  You have: ? Severe pain that does not get better with medicine. ? Sudden numbness or weakness in the foot or ankle below the affected area. ? Trouble moving your foot or ankle. ? A fever. ? Worse or persistent symptoms. ? Chest pain. ? Shortness of breath. Summary  Chronic venous insufficiency is a condition where the leg veins cannot effectively pump blood from the legs to the heart.  Chronic venous insufficiency occurs when the vein walls become stretched, weakened, or damaged, or when valves within the vein are damaged.  Treatment depends on how severe your condition is. It often involves wearing compression stockings and may involve having a procedure.  Make sure you stay active by exercising, walking, or doing different activities. Ask your health care provider what activities are safe for you and how much exercise you need. This information is not intended to replace advice  given to you by your health care provider. Make sure you discuss any questions you have with your health care provider. Document Revised: 02/13/2018 Document Reviewed: 02/13/2018 Elsevier Patient Education  [place].

## 2020-01-31 LAB — MICROALBUMIN, URINE: Microalbumin, Urine: 585.2 ug/mL

## 2020-02-01 ENCOUNTER — Other Ambulatory Visit: Payer: Self-pay | Admitting: Emergency Medicine

## 2020-02-01 MED ORDER — LISINOPRIL 10 MG PO TABS: 10.0000 mg | ORAL_TABLET | Freq: Every day | ORAL | 3 refills | Status: DC

## 2020-03-05 ENCOUNTER — Ambulatory Visit (AMBULATORY_SURGERY_CENTER): Payer: Self-pay | Admitting: *Deleted

## 2020-03-05 ENCOUNTER — Other Ambulatory Visit: Payer: Self-pay

## 2020-03-05 VITALS — Ht 71.0 in | Wt 306.0 lb

## 2020-03-05 DIAGNOSIS — Z86010 Personal history of colonic polyps: Secondary | ICD-10-CM

## 2020-03-05 MED ORDER — SUTAB 1479-225-188 MG PO TABS: 24.0000 | ORAL_TABLET | ORAL | 0 refills | Status: DC

## 2020-03-05 NOTE — Progress Notes (Signed)

## 2020-03-09 ENCOUNTER — Other Ambulatory Visit: Payer: Self-pay | Admitting: Gastroenterology

## 2020-03-09 DIAGNOSIS — Z86010 Personal history of colonic polyps: Secondary | ICD-10-CM

## 2020-03-10 ENCOUNTER — Other Ambulatory Visit: Payer: Self-pay | Admitting: Gastroenterology

## 2020-03-10 ENCOUNTER — Telehealth: Payer: Self-pay | Admitting: Gastroenterology

## 2020-03-10 DIAGNOSIS — Z86010 Personal history of colonic polyps: Secondary | ICD-10-CM

## 2020-03-10 NOTE — Telephone Encounter (Signed)
Pt states his pharmacy does not carry the medication SUTAB.

## 2020-03-10 NOTE — Telephone Encounter (Signed)
See below, thank you

## 2020-03-11 NOTE — Telephone Encounter (Signed)
Informed pt he needs to take the coupon I gave him in [place] to [facility] for [facility] to run OR he can call and give the ok for them to run the coupon information I  sent in the Script for the pharmacy to run as cash and drop price to $40- I explained to him if the pharmacy continues to give him the run around, call me back as we have samples- pt informed he should NOT pay more than $40 for the Sutab - he verbalized understanding  Gabriel Fernandez PV

## 2020-03-11 NOTE — Telephone Encounter (Signed)
[facility]oke with [facility]-  They will not run coupon until pt takes it to the pharamcy- pt was given a sutab coupon at [facility]- also , [facility], it will be ordered and there the next day but [name] has to take the coupon he was given to [facility] FIRST per the pharmacist   [facility] for pt - Gabriel Fernandez PV

## 2020-03-11 NOTE — Telephone Encounter (Signed)
Called [facility]- I made them aware pt has a coupon to drop price to $40

## 2020-03-19 ENCOUNTER — Other Ambulatory Visit: Payer: Self-pay

## 2020-03-19 ENCOUNTER — Encounter: Payer: Self-pay | Admitting: Gastroenterology

## 2020-03-19 ENCOUNTER — Ambulatory Visit (AMBULATORY_SURGERY_CENTER): Payer: Medicare Other | Admitting: Gastroenterology

## 2020-03-19 VITALS — BP 118/62 | HR 55 | Temp 96.6°F | Resp 18 | Ht 71.0 in | Wt 306.0 lb

## 2020-03-19 DIAGNOSIS — K635 Polyp of colon: Secondary | ICD-10-CM

## 2020-03-19 DIAGNOSIS — Z86010 Personal history of colonic polyps: Secondary | ICD-10-CM

## 2020-03-19 DIAGNOSIS — D122 Benign neoplasm of ascending colon: Secondary | ICD-10-CM

## 2020-03-19 MED ORDER — SODIUM CHLORIDE 0.9 % IV SOLN: 500.0000 mL | INTRAVENOUS | Status: DC

## 2020-03-19 NOTE — Patient Instructions (Signed)
Please read handouts provided. Continue present medications. Await pathology results.   YOU HAD AN ENDOSCOPIC PROCEDURE TODAY AT THE [facility]:   Refer to the procedure report that was given to you for any specific questions about what was found during the examination.  If the procedure report does not answer your questions, please call your gastroenterologist to clarify.  If you requested that your care partner not be given the details of your procedure findings, then the procedure report has been included in a sealed envelope for you to review at your convenience later.  YOU SHOULD EXPECT: Some feelings of bloating in the abdomen. Passage of more gas than usual.  Walking can help get rid of the air that was put into your GI tract during the procedure and reduce the bloating. If you had a lower endoscopy (such as a colonoscopy or flexible sigmoidoscopy) you may notice spotting of blood in your stool or on the toilet paper. If you underwent a bowel prep for your procedure, you may not have a normal bowel movement for a few days.  Please Note:  You might notice some irritation and congestion in your nose or some drainage.  This is from the oxygen used during your procedure.  There is no need for concern and it should clear up in a day or so.  SYMPTOMS TO REPORT IMMEDIATELY:  Following lower endoscopy (colonoscopy or flexible sigmoidoscopy):  Excessive amounts of blood in the stool  Significant tenderness or worsening of abdominal pains  Swelling of the abdomen that is new, acute  Fever of 100F or higher   For urgent or emergent issues, a gastroenterologist can be reached at any hour by calling [phone]. Do not use MyChart messaging for urgent concerns.    DIET:  We do recommend a small meal at first, but then you may proceed to your regular diet.  Drink plenty of fluids but you should avoid alcoholic beverages for 24 hours.  ACTIVITY:  You should plan to take it easy  for the rest of today and you should NOT DRIVE or use heavy machinery until tomorrow (because of the sedation medicines used during the test).    FOLLOW UP: Our staff will call the number listed on your records 48-72 hours following your procedure to check on you and address any questions or concerns that you may have regarding the information given to you following your procedure. If we do not reach you, we will leave a message.  We will attempt to reach you two times.  During this call, we will ask if you have developed any symptoms of COVID 19. If you develop any symptoms (ie: fever, flu-like symptoms, shortness of breath, cough etc.) before then, please call [phone].  If you test positive for Covid 19 in the 2 weeks post procedure, please call and report this information to us.    If any biopsies were taken you will be contacted by phone or by letter within the next 1-3 weeks.  Please call us at [phone] if you have not heard about the biopsies in 3 weeks.    SIGNATURES/CONFIDENTIALITY: You and/or your care partner have signed paperwork which will be entered into your electronic medical record.  These signatures attest to the fact that that the information above on your After Visit Summary has been reviewed and is understood.  Full responsibility of the confidentiality of this discharge information lies with you and/or your care-partner.  

## 2020-03-19 NOTE — Op Note (Signed)
[name] Patient Name: Gabriel Fernandez Procedure Date: 03/19/2020 8:04 AM MRN: [id number] Endoscopist: Remo Lipps P. Havery Moros , MD Age: 64 Referring MD:  Date of Birth: 1955/09/22 Gender: Male Account #: [id number] Procedure:                Colonoscopy Indications:              High risk colon cancer surveillance: Personal                            history of colonic polyps in 2018 Medicines:                Monitored Anesthesia Care Procedure:                Pre-Anesthesia Assessment:                           - Prior to the procedure, a History and Physical                            was performed, and patient medications and                            allergies were reviewed. The patient's tolerance of                            previous anesthesia was also reviewed. The risks                            and benefits of the procedure and the sedation                            options and risks were discussed with the patient.                            All questions were answered, and informed consent                            was obtained. Prior Anticoagulants: The patient has                            taken no previous anticoagulant or antiplatelet                            agents. ASA Grade Assessment: III - A patient with                            severe systemic disease. After reviewing the risks                            and benefits, the patient was deemed in                            satisfactory condition to undergo the procedure.  After obtaining informed consent, the colonoscope                            was passed under direct vision. Throughout the                            procedure, the patient's blood pressure, pulse, and                            oxygen saturations were monitored continuously. The                            Colonoscope was introduced through the anus and                            advanced to the the  cecum, identified by                            appendiceal orifice and ileocecal valve. The                            colonoscopy was performed without difficulty. The                            patient tolerated the procedure well. The quality                            of the bowel preparation was adequate. The                            ileocecal valve, appendiceal orifice, and rectum                            were photographed. Scope In: 8:10:49 AM Scope Out: 8:30:00 AM Scope Withdrawal Time: 0 hours 17 minutes 42 seconds  Total Procedure Duration: 0 hours 19 minutes 11 seconds  Findings:                 The perianal and digital rectal examinations were                            normal.                           Two sessile polyps were found in the ascending                            colon. The polyps were 3 to 4 mm in size. These                            polyps were removed with a cold snare. Resection                            and retrieval were complete.  There was a small lipoma, in the ascending colon.                           Many medium-mouthed diverticula were found in the                            sigmoid colon.                           Internal hemorrhoids were found during retroflexion.                           The exam was otherwise without abnormality. Complications:            No immediate complications. Estimated blood loss:                            Minimal. Estimated Blood Loss:     Estimated blood loss was minimal. Impression:               - Two 3 to 4 mm polyps in the ascending colon,                            removed with a cold snare. Resected and retrieved.                           - Small lipoma in the ascending colon.                           - Diverticulosis in the sigmoid colon.                           - Internal hemorrhoids.                           - The examination was otherwise normal. Recommendation:            - Patient has a contact number available for                            emergencies. The signs and symptoms of potential                            delayed complications were discussed with the                            patient. Return to normal activities tomorrow.                            Written discharge instructions were provided to the                            patient.                           - Resume previous diet.                           -  Continue present medications.                           - Await pathology results. Remo Lipps P. Gabriel Lusty, MD 03/19/2020 8:33:56 AM This report has been signed electronically.

## 2020-03-19 NOTE — Progress Notes (Signed)
Report to PACU, RN, vss, BBS= Clear.  

## 2020-03-19 NOTE — Progress Notes (Signed)
V/s SH Pt's states no medical or surgical changes since previsit or office visit.

## 2020-03-23 ENCOUNTER — Telehealth: Payer: Self-pay

## 2020-03-23 NOTE — Telephone Encounter (Signed)
  Follow up Call-  Call back number 03/19/2020  Post procedure Call Back phone  # [phone]  Permission to leave phone message Yes  Some recent data might be hidden     Patient questions:  Do you have a fever, pain , or abdominal swelling? No. Pain Score  0 *  Have you tolerated food without any problems? Yes.    Have you been able to return to your normal activities? Yes.    Do you have any questions about your discharge instructions: Diet   No. Medications  No. Follow up visit  No.  Do you have questions or concerns about your Care? No.  Actions: * If pain score is 4 or above: No action needed, pain <4.  1. Have you developed a fever since your procedure? no  2.   Have you had an respiratory symptoms (SOB or cough) since your procedure? no  3.   Have you tested positive for COVID 19 since your procedure no  4.   Have you had any family members/close contacts diagnosed with the COVID 19 since your procedure?  no   If yes to any of these questions please route to Joylene John, RN and Joella Prince, RN

## 2020-03-24 ENCOUNTER — Other Ambulatory Visit: Payer: Self-pay | Admitting: *Deleted

## 2020-03-24 DIAGNOSIS — I739 Peripheral vascular disease, unspecified: Secondary | ICD-10-CM

## 2020-04-03 NOTE — Telephone Encounter (Signed)
04/03/2020 - PATIENT REQUESTING A REFILL ON HIS METFORMIN HCL 500 mg. I CALLED TO SCHEDULE AN OFFICE VISIT WITH DR. Kittie Plater BUT PATIENT SAID IT WILL NOT BE 14 MONTHS. HE SAID HE JUST SAW DR. Kittie Plater IN [place] OF THIS YEAR. HE DID NOT MAKE AN APPOINTMENT. I WILL ROUTE BACK TO THE CLINICAL TEAM FOR REVIEW. [place]

## 2020-04-03 NOTE — Telephone Encounter (Signed)
Pt needs sooner appt. Will only receive 30 days refill as a curtesy. 07/2020 will be 14 months since last OV.

## 2020-04-07 ENCOUNTER — Other Ambulatory Visit: Payer: Self-pay

## 2020-04-07 ENCOUNTER — Encounter: Payer: Self-pay | Admitting: Vascular Surgery

## 2020-04-07 ENCOUNTER — Ambulatory Visit (HOSPITAL_COMMUNITY)
Admission: RE | Admit: 2020-04-07 | Discharge: 2020-04-07 | Disposition: A | Discharge status: Home / Self Care | Payer: Medicare Other | Source: Ambulatory Visit | Attending: Vascular Surgery | Admitting: Vascular Surgery

## 2020-04-07 ENCOUNTER — Ambulatory Visit (INDEPENDENT_AMBULATORY_CARE_PROVIDER_SITE_OTHER): Payer: Medicare Other | Admitting: Vascular Surgery

## 2020-04-07 VITALS — BP 158/78 | HR 59 | Temp 97.9°F | Ht 72.0 in | Wt 307.9 lb

## 2020-04-07 DIAGNOSIS — I872 Venous insufficiency (chronic) (peripheral): Secondary | ICD-10-CM | POA: Diagnosis not present

## 2020-04-07 DIAGNOSIS — I739 Peripheral vascular disease, unspecified: Secondary | ICD-10-CM

## 2020-04-07 NOTE — Progress Notes (Signed)
ASSESSMENT & PLAN:  64 y.o. 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  He has not yet tried compression therapy. He had a DVT study 01/06/20 which demonstrated no DVT. No reflux testing was performed. ABI today normal. He does not want to wait for a reflux study today because of another appointment. Counseled to trial compression for 3 months. Return to care with venous duplex. Will see if GSV ablation feasible based on reflux study.  CHIEF COMPLAINT:   Venous insufficiency  HISTORY:  HISTORY OF PRESENT ILLNESS: Gabriel Fernandez is a 64 y.o. male who presents to clinic for evaluation of lower extremity swelling and discomfort. He was seen by his primary care provider 01/30/20; DVT study confirmed no DVT. Compression was recommended. He was not compliant with compression. He is not able to elevate his legs during the day because of the nature of his work. He is understanding that he needs to lose weight.  Past Medical History:  Diagnosis Date  . Allergy   . Arthritis   . Diabetes mellitus without complication ([place])   . GERD (gastroesophageal reflux disease)   . Gout   . Hyperlipidemia   . Sleep apnea    Not on CPAP    Past Surgical History:  Procedure Laterality Date  . COLONOSCOPY     sev colon's   . POLYPECTOMY      Family History  Problem Relation Age of Onset  . Cancer Mother   . Ovarian cancer Mother   . Stomach cancer Mother   . Colon cancer Neg Hx   . Esophageal cancer Neg Hx   . Rectal cancer Neg Hx   . Colon polyps Neg Hx     Social History   Socioeconomic History  . Marital status: Single    Spouse name: Not on file  . Number of children: Not on file  . Years of education: Not on file  . Highest education level: Not on file  Occupational History  . Not on file  Tobacco Use  . Smoking status: Current Every Day Smoker    Packs/day: 1.00    Years: 35.00    Pack years: 35.00    . Smokeless tobacco: Never Used  . Tobacco comment: 1-2 cigs a day or more   Substance and Sexual Activity  . Alcohol use: Yes    Alcohol/week: 6.0 standard drinks    Types: 4 Glasses of wine, 2 Cans of beer per week  . Drug use: No  . Sexual activity: Not on file  Other Topics Concern  . Not on file  Social History Narrative  . Not on file   Social Determinants of Health   Financial Resource Strain:   . Difficulty of Paying Living Expenses: Not on file  Food Insecurity:   . Worried About Charity fundraiser in the Last Year: Not on file  . Ran Out of Food in the Last Year: Not on file  Transportation Needs:   . Lack of Transportation (Medical): Not on file  . Lack of Transportation (Non-Medical): Not on file  Physical Activity:   . Days of Exercise per Week: Not on file  . Minutes of Exercise per Session: Not on file  Stress:   . Feeling of Stress : Not on file  Social Connections:   . Frequency of Communication with Friends and Family: Not on file  . Frequency of Social Gatherings with Friends and Family: Not  on file  . Attends Religious Services: Not on file  . Active Member of Clubs or Organizations: Not on file  . Attends Archivist Meetings: Not on file  . Marital Status: Not on file  Intimate Partner Violence:   . Fear of Current or Ex-Partner: Not on file  . Emotionally Abused: Not on file  . Physically Abused: Not on file  . Sexually Abused: Not on file    No Known Allergies  Current Outpatient Medications  Medication Sig Dispense Refill  . atorvastatin (LIPITOR) 20 MG tablet Take 1 tablet (20 mg total) by mouth daily. 90 tablet 3  . metFORMIN (GLUCOPHAGE) 500 MG tablet Take 1 tablet (500 mg total) by mouth 2 (two) times daily with a meal. 180 tablet 3  . Sodium Sulfate-Mag Sulfate-KCl (SUTAB) [phone] MG TABS Take 24 tablets by mouth as directed. MANUFACTURER CODES!! BIN: [id number] PCN: CN GROUP: [id number] MEMBER ID: [id number];QHU AS CASH;NO  PRIOR AUTHORIZATION 24 tablet 0  . tamsulosin (FLOMAX) 0.4 MG CAPS capsule Take 1 capsule (0.4 mg total) by mouth daily after breakfast. 90 capsule 3   No current facility-administered medications for this visit.    REVIEW OF SYSTEMS:  [X]  denotes positive finding, [ ]  denotes negative finding Cardiac  Comments:  Chest pain or chest pressure:    Shortness of breath upon exertion:    Short of breath when lying flat:    Irregular heart rhythm:        Vascular    Pain in calf, thigh, or hip brought on by ambulation:    Pain in feet at night that wakes you up from your sleep:     Blood clot in your veins:    Leg swelling:  x       Pulmonary    Oxygen at home:    Productive cough:     Wheezing:         Neurologic    Sudden weakness in arms or legs:     Sudden numbness in arms or legs:     Sudden onset of difficulty speaking or slurred speech:    Temporary loss of vision in one eye:     Problems with dizziness:         Gastrointestinal    Blood in stool:     Vomited blood:         Genitourinary    Burning when urinating:     Blood in urine:        Psychiatric    Major depression:         Hematologic    Bleeding problems:    Problems with blood clotting too easily:        Skin    Rashes or ulcers:        Constitutional    Fever or chills:     PHYSICAL EXAM:   Vitals:   04/07/20 1400 04/07/20 1405  BP: (!) 154/76 (!) 158/78  Pulse: (!) 59   Temp: 97.9 F (36.6 C)   TempSrc: Skin   SpO2: 97%   Weight: (!) 307 lb 14.4 oz (139.7 kg)   Height: 6' (1.829 m)     Constitutional: well appearing in no distress. Appears well nourished.  Neurologic: normal gait and station. CN intact.  No weakness. No sensory loss. Psychiatric: Mood and affect symmetric and appropriate. Eyes: No icterus. No conjunctival pallor. Ears, nose, throat: mucous membranes moist. midline trachea.  Cardiac:  regular rate and rhythm.  Respiratory: unlabored  Abdominal: obese. soft,  non-tender, non-distended.  Peripheral vascular:  Dorsalis pedis pulse: L 2+ / R 2+  Reticular veins about the ankles bilaterally  Rubor about feet and ankles bilaterally  Varicosities medial calf and thigh bilaterally  No evidence of venous stasis dermatitis  No venous ulceration Extremity: 1+ edema. No cyanosis. No pallor.  Skin: No gangrene. No ulceration.  Lymphatic: No Stemmer's sign. No palpable lymphadenopathy.  DATA REVIEW:    Most recent CBC CBC Latest Ref Rng & Units 11/27/2018 06/15/2018 04/26/2017  WBC 3.4 - 10.8 x10E3/uL 5.4 6.8 6.6  Hemoglobin 13.0 - 17.7 g/dL 15.9 16.9 15.8  Hematocrit 37.5 - 51.0 % 45.2 48.4 46.0  Platelets 150 - 450 x10E3/uL 246 255 243     Most recent CMP CMP Latest Ref Rng & Units 01/30/2020 11/27/2018 06/15/2018  Glucose 65 - 99 mg/dL 112(H) 108(H) 108(H)  BUN 8 - 27 mg/dL 11 17 18   Creatinine 0.76 - 1.27 mg/dL 0.92 0.87 0.80  Sodium 134 - 144 mmol/L 132(L) 136 139  Potassium 3.5 - 5.2 mmol/L 5.2 5.4(H) 4.8  Chloride 96 - 106 mmol/L 99 101 106  CO2 20 - 29 mmol/L 22 22 18(L)  Calcium 8.6 - 10.2 mg/dL 9.6 10.0 9.6  Total Protein 6.0 - 8.5 g/dL 6.7 7.1 7.0  Total Bilirubin 0.0 - 1.2 mg/dL 0.4 0.4 0.3  Alkaline Phos 48 - 121 IU/L 79 68 62  AST 0 - 40 IU/L 18 20 20   ALT 0 - 44 IU/L 25 31 18     Renal function CrCl cannot be calculated (Patient's most recent lab result is older than the maximum 21 days allowed.).  Hgb A1c MFr Bld (%)  Date Value  01/30/2020 6.1 (H)    LDL Chol Calc (NIH)  Date Value Ref Range Status  01/30/2020 80 0 - 99 mg/dL Final     Vascular Imaging: ABI 04/07/20   Yevonne Aline. Stanford Breed, MD [facility] Phone Number: [phone] 04/07/2020 3:14 PM

## 2020-04-08 ENCOUNTER — Other Ambulatory Visit: Payer: Self-pay

## 2020-04-08 DIAGNOSIS — I872 Venous insufficiency (chronic) (peripheral): Secondary | ICD-10-CM

## 2020-06-10 LAB — HM DIABETES EYE EXAM

## 2020-06-16 LAB — HM DIABETES EYE EXAM

## 2020-06-17 ENCOUNTER — Encounter: Payer: Self-pay | Admitting: *Deleted

## 2020-06-25 ENCOUNTER — Encounter: Payer: Self-pay | Admitting: *Deleted

## 2020-07-16 ENCOUNTER — Ambulatory Visit: Payer: Medicare Other | Admitting: Vascular Surgery

## 2020-07-16 ENCOUNTER — Encounter (HOSPITAL_COMMUNITY): Payer: Medicare Other

## 2020-07-30 ENCOUNTER — Ambulatory Visit: Payer: Medicare Other | Admitting: Emergency Medicine

## 2020-08-03 ENCOUNTER — Ambulatory Visit: Payer: Medicare Other | Admitting: Emergency Medicine

## 2020-08-06 ENCOUNTER — Ambulatory Visit (INDEPENDENT_AMBULATORY_CARE_PROVIDER_SITE_OTHER): Payer: Medicare Other | Admitting: Emergency Medicine

## 2020-08-06 ENCOUNTER — Encounter: Payer: Self-pay | Admitting: Emergency Medicine

## 2020-08-06 ENCOUNTER — Other Ambulatory Visit: Payer: Self-pay

## 2020-08-06 VITALS — BP 108/71 | HR 84 | Temp 98.2°F | Resp 16 | Ht 72.0 in | Wt 301.0 lb

## 2020-08-06 DIAGNOSIS — I739 Peripheral vascular disease, unspecified: Secondary | ICD-10-CM

## 2020-08-06 DIAGNOSIS — F17209 Nicotine dependence, unspecified, with unspecified nicotine-induced disorders: Secondary | ICD-10-CM

## 2020-08-06 DIAGNOSIS — F17200 Nicotine dependence, unspecified, uncomplicated: Secondary | ICD-10-CM

## 2020-08-06 DIAGNOSIS — E1169 Type 2 diabetes mellitus with other specified complication: Secondary | ICD-10-CM

## 2020-08-06 DIAGNOSIS — E785 Hyperlipidemia, unspecified: Secondary | ICD-10-CM | POA: Diagnosis not present

## 2020-08-06 DIAGNOSIS — J410 Simple chronic bronchitis: Secondary | ICD-10-CM

## 2020-08-06 LAB — POCT GLYCOSYLATED HEMOGLOBIN (HGB A1C): Hemoglobin A1C: 6.2 % — AB (ref 4.0–5.6)

## 2020-08-06 LAB — GLUCOSE, POCT (MANUAL RESULT ENTRY): POC Glucose: 102 mg/dl — AB (ref 70–99)

## 2020-08-06 MED ORDER — ATORVASTATIN CALCIUM 20 MG PO TABS: 20.0000 mg | ORAL_TABLET | Freq: Every day | ORAL | 3 refills | Status: DC

## 2020-08-06 NOTE — Patient Instructions (Addendum)
   If you have lab work done today you will be contacted with your lab results within the next 2 weeks.  If you have not heard from us then please contact us. The fastest way to get your results is to register for My Chart.   IF you received an x-ray today, you will receive an invoice from [facility]. Please contact [facility] at [phone] with questions or concerns regarding your invoice.   IF you received labwork today, you will receive an invoice from [facility]. Please contact [facility] at [phone] with questions or concerns regarding your invoice.   Our billing staff will not be able to assist you with questions regarding bills from these companies.  You will be contacted with the lab results as soon as they are available. The fastest way to get your results is to activate your My Chart account. Instructions are located on the last page of this paperwork. If you have not heard from us regarding the results in 2 weeks, please contact this office.       Health Maintenance After Age 65 After age 65, you are at a higher risk for certain long-term diseases and infections as well as injuries from falls. Falls are a major cause of broken bones and head injuries in people who are older than age 65. Getting regular preventive care can help to keep you healthy and well. Preventive care includes getting regular testing and making lifestyle changes as recommended by your health care provider. Talk with your health care provider about:  Which screenings and tests you should have. A screening is a test that checks for a disease when you have no symptoms.  A diet and exercise plan that is right for you. What should I know about screenings and tests to prevent falls? Screening and testing are the best ways to find a health problem early. Early diagnosis and treatment give you the best chance of managing medical conditions that are common after age 65. Certain conditions and  lifestyle choices may make you more likely to have a fall. Your health care provider may recommend:  Regular vision checks. Poor vision and conditions such as cataracts can make you more likely to have a fall. If you wear glasses, make sure to get your prescription updated if your vision changes.  Medicine review. Work with your health care provider to regularly review all of the medicines you are taking, including over-the-counter medicines. Ask your health care provider about any side effects that may make you more likely to have a fall. Tell your health care provider if any medicines that you take make you feel dizzy or sleepy.  Osteoporosis screening. Osteoporosis is a condition that causes the bones to get weaker. This can make the bones weak and cause them to break more easily.  Blood pressure screening. Blood pressure changes and medicines to control blood pressure can make you feel dizzy.  Strength and balance checks. Your health care provider may recommend certain tests to check your strength and balance while standing, walking, or changing positions.  Foot health exam. Foot pain and numbness, as well as not wearing proper footwear, can make you more likely to have a fall.  Depression screening. You may be more likely to have a fall if you have a fear of falling, feel emotionally low, or feel unable to do activities that you used to do.  Alcohol use screening. Using too much alcohol can affect your balance and may make you more   likely to have a fall. What actions can I take to lower my risk of falls? General instructions  Talk with your health care provider about your risks for falling. Tell your health care provider if: ? You fall. Be sure to tell your health care provider about all falls, even ones that seem minor. ? You feel dizzy, sleepy, or off-balance.  Take over-the-counter and prescription medicines only as told by your health care provider. These include any  supplements.  Eat a healthy diet and maintain a healthy weight. A healthy diet includes low-fat dairy products, low-fat (lean) meats, and fiber from whole grains, beans, and lots of fruits and vegetables. Home safety  Remove any tripping hazards, such as rugs, cords, and clutter.  Install safety equipment such as grab bars in bathrooms and safety rails on stairs.  Keep rooms and walkways well-lit. Activity  Follow a regular exercise program to stay fit. This will help you maintain your balance. Ask your health care provider what types of exercise are appropriate for you.  If you need a cane or walker, use it as recommended by your health care provider.  Wear supportive shoes that have nonskid soles.   Lifestyle  Do not drink alcohol if your health care provider tells you not to drink.  If you drink alcohol, limit how much you have: ? 0-1 drink a day for women. ? 0-2 drinks a day for men.  Be aware of how much alcohol is in your drink. In the [place] one drink equals one typical bottle of beer (12 oz), one-half glass of wine (5 oz), or one shot of hard liquor (1 oz).  Do not use any products that contain nicotine or tobacco, such as cigarettes and e-cigarettes. If you need help quitting, ask your health care provider. Summary  Having a healthy lifestyle and getting preventive care can help to protect your health and wellness after age 65.  Screening and testing are the best way to find a health problem early and help you avoid having a fall. Early diagnosis and treatment give you the best chance for managing medical conditions that are more common for people who are older than age 65.  Falls are a major cause of broken bones and head injuries in people who are older than age 65. Take precautions to prevent a fall at home.  Work with your health care provider to learn what changes you can make to improve your health and wellness and to prevent falls. This information is not intended  to replace advice given to you by your health care provider. Make sure you discuss any questions you have with your health care provider. Document Revised: 09/13/2018 Document Reviewed: 04/05/2017 [employer]vier Patient Education  2021 [employer]  

## 2020-08-06 NOTE — Progress Notes (Signed)
Gabriel Fernandez 65 y.o.   Chief Complaint  Patient presents with  . Diabetes    Follow up 6 month  . Cough    Per patient a couple of months, patient has not been tested for Covid 19 virus    HISTORY OF PRESENT ILLNESS: This is a 65 y.o. male with history of diabetes and dyslipidemia here for follow-up. Continues to smoke.  Has dry chronic cough. Sedentary lifestyle. Taking Metformin only once a day. No other complaints or medical concerns today. Lab Results  Component Value Date   HGBA1C 6.1 (H) 01/30/2020    HPI   Prior to Admission medications   Medication Sig Start Date End Date Taking? Authorizing Provider  metFORMIN (GLUCOPHAGE) 500 MG tablet Take 1 tablet (500 mg total) by mouth 2 (two) times daily with a meal. 01/30/20  Yes Kristoffer Bala, Ines Bloomer, MD  Sodium Sulfate-Mag Sulfate-KCl (SUTAB) [phone] MG TABS Take 24 tablets by mouth as directed. MANUFACTURER CODES!! BIN: [id number] PCN: CN GROUP: [id number] MEMBER ID: [id number];HER AS CASH;NO PRIOR AUTHORIZATION 03/11/20  Yes Armbruster, Carlota Raspberry, MD  tamsulosin (FLOMAX) 0.4 MG CAPS capsule Take 1 capsule (0.4 mg total) by mouth daily after breakfast. 01/30/20  Yes Pastor Sgro, Ines Bloomer, MD  atorvastatin (LIPITOR) 20 MG tablet Take 1 tablet (20 mg total) by mouth daily. 01/30/20 04/29/20  Horald Pollen, MD    No Known Allergies  Patient Active Problem List   Diagnosis Date Noted  . Type 2 diabetes mellitus with hyperglycemia, without long-term current use of insulin ([place]) 01/30/2020  . PVD (peripheral vascular disease) ([place]) 01/30/2020  . Chronic venous insufficiency 01/30/2020  . Benign prostatic hyperplasia 01/30/2020  . Tobacco use disorder, continuous 01/30/2020  . Smoker 06/08/2013  . Other and unspecified hyperlipidemia 06/08/2013  . Gout 06/08/2013  . Morbid obesity ([place]) 06/08/2013    Past Medical History:  Diagnosis Date  . Allergy   . Arthritis   . Diabetes mellitus without complication ([place])    . GERD (gastroesophageal reflux disease)   . Gout   . Hyperlipidemia   . Sleep apnea    Not on CPAP    Past Surgical History:  Procedure Laterality Date  . COLONOSCOPY     sev colon's   . POLYPECTOMY      Social History   Socioeconomic History  . Marital status: Single    Spouse name: Not on file  . Number of children: Not on file  . Years of education: Not on file  . Highest education level: Not on file  Occupational History  . Not on file  Tobacco Use  . Smoking status: Current Every Day Smoker    Packs/day: 1.00    Years: 35.00    Pack years: 35.00  . Smokeless tobacco: Never Used  . Tobacco comment: 1-2 cigs a day or more   Substance and Sexual Activity  . Alcohol use: Yes    Alcohol/week: 6.0 standard drinks    Types: 4 Glasses of wine, 2 Cans of beer per week  . Drug use: No  . Sexual activity: Not on file  Other Topics Concern  . Not on file  Social History Narrative  . Not on file   Social Determinants of Health   Financial Resource Strain: Not on file  Food Insecurity: Not on file  Transportation Needs: Not on file  Physical Activity: Not on file  Stress: Not on file  Social Connections: Not on file  Intimate Partner Violence: Not on file  Family History  Problem Relation Age of Onset  . Cancer Mother   . Ovarian cancer Mother   . Stomach cancer Mother   . Colon cancer Neg Hx   . Esophageal cancer Neg Hx   . Rectal cancer Neg Hx   . Colon polyps Neg Hx      Review of Systems  Constitutional: Negative.  Negative for chills and fever.  HENT: Negative.  Negative for congestion and sore throat.   Respiratory: Negative.  Negative for cough and shortness of breath.   Cardiovascular: Negative.  Negative for chest pain and palpitations.  Gastrointestinal: Negative.  Negative for abdominal pain, diarrhea, nausea and vomiting.  Genitourinary: Negative.  Negative for dysuria and hematuria.  Musculoskeletal: Negative.  Negative for back pain,  myalgias and neck pain.  Skin: Negative.  Negative for rash.  Neurological: Negative.  Negative for dizziness and headaches.    Today's Vitals   08/06/20 1446  BP: 108/71  Pulse: 84  Resp: 16  Temp: 98.2 F (36.8 C)  TempSrc: Temporal  SpO2: 95%  Weight: (!) 301 lb (136.5 kg)  Height: 6' (1.829 m)   Body mass index is 40.82 kg/m. Wt Readings from Last 3 Encounters:  08/06/20 (!) 301 lb (136.5 kg)  04/07/20 (!) 307 lb 14.4 oz (139.7 kg)  03/19/20 (!) 306 lb (138.8 kg)    Physical Exam Vitals reviewed.  Constitutional:      Appearance: He is obese.  HENT:     Head: Normocephalic.  Eyes:     Extraocular Movements: Extraocular movements intact.     Pupils: Pupils are equal, round, and reactive to light.  Cardiovascular:     Rate and Rhythm: Normal rate and regular rhythm.     Pulses: Normal pulses.     Heart sounds: Normal heart sounds.  Pulmonary:     Effort: Pulmonary effort is normal.     Breath sounds: Normal breath sounds.  Musculoskeletal:     Cervical back: Normal range of motion and neck supple.  Lymphadenopathy:     Cervical: No cervical adenopathy.  Skin:    General: Skin is warm and dry.     Capillary Refill: Capillary refill takes less than 2 seconds.  Neurological:     General: No focal deficit present.     Mental Status: He is alert and oriented to person, place, and time.  Psychiatric:        Mood and Affect: Mood normal.        Behavior: Behavior normal.    Results for orders placed or performed in visit on 08/06/20 (from the past 24 hour(s))  POCT glucose (manual entry)     Status: Abnormal   Collection Time: 08/06/20  3:14 PM  Result Value Ref Range   POC Glucose 102 (A) 70 - 99 mg/dl  POCT glycosylated hemoglobin (Hb A1C)     Status: Abnormal   Collection Time: 08/06/20  3:19 PM  Result Value Ref Range   Hemoglobin A1C 6.2 (A) 4.0 - 5.6 %   HbA1c POC (<> result, manual entry)     HbA1c, POC (prediabetic range)     HbA1c, POC  (controlled diabetic range)       ASSESSMENT & PLAN: Dyslipidemia associated with type 2 diabetes mellitus ([place]) Well-controlled diabetes with hemoglobin A1c of 6.2. Continue Metformin. Diet and nutrition discussed. Follow-up in 6 months.  Smoker Smoking cessation advice given. Cardiovascular risks associated with smoking discussed with patient.  Lajuane was seen today for diabetes and  cough.  Diagnoses and all orders for this visit:  Dyslipidemia associated with type 2 diabetes mellitus ([place]) -     POCT glucose (manual entry) -     POCT glycosylated hemoglobin (Hb A1C) -     CMP14+EGFR  PVD (peripheral vascular disease) ([facility])  Tobacco use disorder, continuous  Morbid obesity ([place])  Smokers' cough ([place])  Hyperlipidemia, unspecified hyperlipidemia type -     atorvastatin (LIPITOR) 20 MG tablet; Take 1 tablet (20 mg total) by mouth daily.  Smoker    Patient Instructions       If you have lab work done today you will be contacted with your lab results within the next 2 weeks.  If you have not heard from [place] then please contact us. The fastest way to get your results is to register for My Chart.   IF you received an x-ray today, you will receive an invoice from [facility]. Please contact [facility] at [phone] with questions or concerns regarding your invoice.   IF you received labwork today, you will receive an invoice from [facility]. Please contact [facility] at [phone] with questions or concerns regarding your invoice.   Our billing staff will not be able to assist you with questions regarding bills from these companies.  You will be contacted with the lab results as soon as they are available. The fastest way to get your results is to activate your My Chart account. Instructions are located on the last page of this paperwork. If you have not heard from [place] regarding the results in 2 weeks, please contact this office.     Health  Maintenance After Age 66 After age 10, you are at a higher risk for certain long-term diseases and infections as well as injuries from falls. Falls are a major cause of broken bones and head injuries in people who are older than age 81. Getting regular preventive care can help to keep you healthy and well. Preventive care includes getting regular testing and making lifestyle changes as recommended by your health care provider. Talk with your health care provider about:  Which screenings and tests you should have. A screening is a test that checks for a disease when you have no symptoms.  A diet and exercise plan that is right for you. What should I know about screenings and tests to prevent falls? Screening and testing are the best ways to find a health problem early. Early diagnosis and treatment give you the best chance of managing medical conditions that are common after age 25. Certain conditions and lifestyle choices may make you more likely to have a fall. Your health care provider may recommend:  Regular vision checks. Poor vision and conditions such as cataracts can make you more likely to have a fall. If you wear glasses, make sure to get your prescription updated if your vision changes.  Medicine review. Work with your health care provider to regularly review all of the medicines you are taking, including over-the-counter medicines. Ask your health care provider about any side effects that may make you more likely to have a fall. Tell your health care provider if any medicines that you take make you feel dizzy or sleepy.  Osteoporosis screening. Osteoporosis is a condition that causes the bones to get weaker. This can make the bones weak and cause them to break more easily.  Blood pressure screening. Blood pressure changes and medicines to control blood pressure can make you feel dizzy.  Strength and balance checks.  Your health care provider may recommend certain tests to check your  strength and balance while standing, walking, or changing positions.  Foot health exam. Foot pain and numbness, as well as not wearing proper footwear, can make you more likely to have a fall.  Depression screening. You may be more likely to have a fall if you have a fear of falling, feel emotionally low, or feel unable to do activities that you used to do.  Alcohol use screening. Using too much alcohol can affect your balance and may make you more likely to have a fall. What actions can I take to lower my risk of falls? General instructions  Talk with your health care provider about your risks for falling. Tell your health care provider if: ? You fall. Be sure to tell your health care provider about all falls, even ones that seem minor. ? You feel dizzy, sleepy, or off-balance.  Take over-the-counter and prescription medicines only as told by your health care provider. These include any supplements.  Eat a healthy diet and maintain a healthy weight. A healthy diet includes low-fat dairy products, low-fat (lean) meats, and fiber from whole grains, beans, and lots of fruits and vegetables. Home safety  Remove any tripping hazards, such as rugs, cords, and clutter.  Install safety equipment such as grab bars in bathrooms and safety rails on stairs.  Keep rooms and walkways well-lit. Activity  Follow a regular exercise program to stay fit. This will help you maintain your balance. Ask your health care provider what types of exercise are appropriate for you.  If you need a cane or walker, use it as recommended by your health care provider.  Wear supportive shoes that have nonskid soles.   Lifestyle  Do not drink alcohol if your health care provider tells you not to drink.  If you drink alcohol, limit how much you have: ? 0-1 drink a day for women. ? 0-2 drinks a day for men.  Be aware of how much alcohol is in your drink. In the [place] one drink equals one typical bottle of beer (12  oz), one-half glass of wine (5 oz), or one shot of hard liquor (1 oz).  Do not use any products that contain nicotine or tobacco, such as cigarettes and e-cigarettes. If you need help quitting, ask your health care provider. Summary  Having a healthy lifestyle and getting preventive care can help to protect your health and wellness after age 22.  Screening and testing are the best way to find a health problem early and help you avoid having a fall. Early diagnosis and treatment give you the best chance for managing medical conditions that are more common for people who are older than age 15.  Falls are a major cause of broken bones and head injuries in people who are older than age 30. Take precautions to prevent a fall at home.  Work with your health care provider to learn what changes you can make to improve your health and wellness and to prevent falls. This information is not intended to replace advice given to you by your health care provider. Make sure you discuss any questions you have with your health care provider. Document Revised: 09/13/2018 Document Reviewed: 04/05/2017 Elsevier Patient Education  2021 Elsevier Inc.      Agustina Caroli, MD [facility]

## 2020-08-06 NOTE — Assessment & Plan Note (Signed)
Smoking cessation advice given. Cardiovascular risks associated with smoking discussed with patient.

## 2020-08-06 NOTE — Assessment & Plan Note (Signed)
Well-controlled diabetes with hemoglobin A1c of 6.2. Continue Metformin. Diet and nutrition discussed. Follow-up in 6 months.

## 2020-08-07 LAB — CMP14+EGFR
ALT: 23 IU/L (ref 0–44)
AST: 16 IU/L (ref 0–40)
Albumin/Globulin Ratio: 1.5 (ref 1.2–2.2)
Albumin: 4.3 g/dL (ref 3.8–4.8)
Alkaline Phosphatase: 79 IU/L (ref 44–121)
BUN/Creatinine Ratio: 15 (ref 10–24)
BUN: 12 mg/dL (ref 8–27)
Bilirubin Total: 0.4 mg/dL (ref 0.0–1.2)
CO2: 19 mmol/L — ABNORMAL LOW (ref 20–29)
Calcium: 9.7 mg/dL (ref 8.6–10.2)
Chloride: 101 mmol/L (ref 96–106)
Creatinine, Ser: 0.8 mg/dL (ref 0.76–1.27)
Globulin, Total: 2.8 g/dL (ref 1.5–4.5)
Glucose: 106 mg/dL — ABNORMAL HIGH (ref 65–99)
Potassium: 4.7 mmol/L (ref 3.5–5.2)
Sodium: 134 mmol/L (ref 134–144)
Total Protein: 7.1 g/dL (ref 6.0–8.5)
eGFR: 97 mL/min/{1.73_m2} (ref >59)

## 2020-08-20 ENCOUNTER — Encounter (HOSPITAL_COMMUNITY): Payer: Medicare Other

## 2020-08-20 ENCOUNTER — Ambulatory Visit: Payer: Medicare Other | Admitting: Vascular Surgery

## 2020-11-26 ENCOUNTER — Ambulatory Visit: Payer: Medicare Other | Admitting: Vascular Surgery

## 2020-11-26 ENCOUNTER — Ambulatory Visit (HOSPITAL_COMMUNITY): Payer: PRIVATE HEALTH INSURANCE

## 2021-01-24 ENCOUNTER — Other Ambulatory Visit: Payer: Self-pay | Admitting: Emergency Medicine

## 2021-01-24 DIAGNOSIS — E1169 Type 2 diabetes mellitus with other specified complication: Secondary | ICD-10-CM

## 2021-01-24 DIAGNOSIS — E785 Hyperlipidemia, unspecified: Secondary | ICD-10-CM

## 2021-01-24 DIAGNOSIS — N400 Enlarged prostate without lower urinary tract symptoms: Secondary | ICD-10-CM

## 2021-02-24 ENCOUNTER — Encounter (HOSPITAL_COMMUNITY): Payer: Medicare Other

## 2021-02-24 ENCOUNTER — Ambulatory Visit: Payer: Medicare Other | Admitting: Vascular Surgery

## 2021-02-25 ENCOUNTER — Encounter (HOSPITAL_COMMUNITY): Payer: Medicare Other

## 2021-02-25 ENCOUNTER — Ambulatory Visit: Payer: Medicare Other | Admitting: Vascular Surgery

## 2021-03-03 ENCOUNTER — Encounter (HOSPITAL_COMMUNITY): Payer: Medicare Other

## 2021-03-03 ENCOUNTER — Ambulatory Visit: Payer: Medicare Other | Admitting: Vascular Surgery

## 2021-03-04 ENCOUNTER — Ambulatory Visit (INDEPENDENT_AMBULATORY_CARE_PROVIDER_SITE_OTHER): Payer: Medicare Other | Admitting: Emergency Medicine

## 2021-03-04 ENCOUNTER — Encounter: Payer: Self-pay | Admitting: Emergency Medicine

## 2021-03-04 ENCOUNTER — Other Ambulatory Visit: Payer: Self-pay

## 2021-03-04 VITALS — BP 138/80 | HR 65 | Temp 98.5°F | Ht 72.0 in | Wt 295.0 lb

## 2021-03-04 DIAGNOSIS — E1169 Type 2 diabetes mellitus with other specified complication: Secondary | ICD-10-CM | POA: Diagnosis not present

## 2021-03-04 DIAGNOSIS — N400 Enlarged prostate without lower urinary tract symptoms: Secondary | ICD-10-CM

## 2021-03-04 DIAGNOSIS — M5432 Sciatica, left side: Secondary | ICD-10-CM | POA: Diagnosis not present

## 2021-03-04 DIAGNOSIS — E785 Hyperlipidemia, unspecified: Secondary | ICD-10-CM | POA: Diagnosis not present

## 2021-03-04 DIAGNOSIS — Z23 Encounter for immunization: Secondary | ICD-10-CM | POA: Diagnosis not present

## 2021-03-04 DIAGNOSIS — E1159 Type 2 diabetes mellitus with other circulatory complications: Secondary | ICD-10-CM | POA: Diagnosis not present

## 2021-03-04 DIAGNOSIS — I872 Venous insufficiency (chronic) (peripheral): Secondary | ICD-10-CM

## 2021-03-04 DIAGNOSIS — I152 Hypertension secondary to endocrine disorders: Secondary | ICD-10-CM

## 2021-03-04 DIAGNOSIS — F17209 Nicotine dependence, unspecified, with unspecified nicotine-induced disorders: Secondary | ICD-10-CM

## 2021-03-04 DIAGNOSIS — Z8616 Personal history of COVID-19: Secondary | ICD-10-CM

## 2021-03-04 LAB — LIPID PANEL
Cholesterol: 161 mg/dL (ref 0–200)
HDL: 48.6 mg/dL (ref >39.00)
LDL Cholesterol: 84 mg/dL (ref 0–99)
NonHDL: 112.01
Total CHOL/HDL Ratio: 3
Triglycerides: 139 mg/dL (ref 0.0–149.0)
VLDL: 27.8 mg/dL (ref 0.0–40.0)

## 2021-03-04 LAB — COMPREHENSIVE METABOLIC PANEL
ALT: 30 U/L (ref 0–53)
AST: 17 U/L (ref 0–37)
Albumin: 4 g/dL (ref 3.5–5.2)
Alkaline Phosphatase: 69 U/L (ref 39–117)
BUN: 17 mg/dL (ref 6–23)
CO2: 27 mEq/L (ref 19–32)
Calcium: 9.9 mg/dL (ref 8.4–10.5)
Chloride: 100 mEq/L (ref 96–112)
Creatinine, Ser: 0.9 mg/dL (ref 0.40–1.50)
GFR: 88.24 mL/min (ref >60.00)
Glucose, Bld: 94 mg/dL (ref 70–99)
Potassium: 4.7 mEq/L (ref 3.5–5.1)
Sodium: 135 mEq/L (ref 135–145)
Total Bilirubin: 0.4 mg/dL (ref 0.2–1.2)
Total Protein: 7.3 g/dL (ref 6.0–8.3)

## 2021-03-04 LAB — HEMOGLOBIN A1C: Hgb A1c MFr Bld: 6.5 % (ref 4.6–6.5)

## 2021-03-04 MED ORDER — ATORVASTATIN CALCIUM 20 MG PO TABS: 20.0000 mg | ORAL_TABLET | Freq: Every day | ORAL | 3 refills | Status: DC

## 2021-03-04 MED ORDER — LISINOPRIL 20 MG PO TABS: 20.0000 mg | ORAL_TABLET | Freq: Every day | ORAL | 3 refills | Status: DC

## 2021-03-04 MED ORDER — METFORMIN HCL 500 MG PO TABS: 500.0000 mg | ORAL_TABLET | Freq: Two times a day (BID) | ORAL | 3 refills | Status: DC

## 2021-03-04 NOTE — Assessment & Plan Note (Signed)
Lipid profile done today.  Continue atorvastatin 20 mg daily. The 10-year ASCVD risk score (Arnett DK, et al., 2019) is: 30.4%   Values used to calculate the score:     Age: 65 years     Sex: Male     Is Non-Hispanic African American: No     Diabetic: Yes     Tobacco smoker: Yes     Systolic Blood Pressure: 010 mmHg     Is BP treated: No     HDL Cholesterol: 60 mg/dL     Total Cholesterol: 157 mg/dL

## 2021-03-04 NOTE — Assessment & Plan Note (Signed)
Elevated blood pressure in the office.  Has been off medication for several weeks.  Start lisinopril 20 mg daily. Lab Results  Component Value Date   HGBA1C 6.2 (A) 08/06/2020  We will repeat A1c today.  Diet and nutrition discussed.  Continue metformin 500 mg twice a day.  Follow-up in 6 months.

## 2021-03-04 NOTE — Progress Notes (Signed)
Gabriel Fernandez 65 y.o.   Chief Complaint  Patient presents with   Hyperlipidemia     F/u on diabetes and cholesterol. Refill on lisinopril    HISTORY OF PRESENT ILLNESS: This is a 65 y.o. male with history of diabetes and dyslipidemia here for follow-up and medication refill. Recently had COVID infection.  Recovering well. BP Readings from Last 3 Encounters:  03/04/21 (!) 148/86  08/06/20 108/71  04/07/20 (!) 158/78   Lab Results  Component Value Date   CHOL 157 01/30/2020   HDL 60 01/30/2020   LDLCALC 80 01/30/2020   TRIG 93 01/30/2020   CHOLHDL 2.6 01/30/2020   Lab Results  Component Value Date   CREATININE 0.80 08/06/2020   BUN 12 08/06/2020   NA 134 08/06/2020   K 4.7 08/06/2020   CL 101 08/06/2020   CO2 19 (L) 08/06/2020     Hyperlipidemia Pertinent negatives include no chest pain or shortness of breath.    Prior to Admission medications   Medication Sig Start Date End Date Taking? Authorizing Provider  atorvastatin (LIPITOR) 20 MG tablet Take 1 tablet (20 mg total) by mouth daily. 08/06/20 03/04/21 Yes Malaiah Viramontes, Ines Bloomer, MD  lisinopril (ZESTRIL) 10 MG tablet Take 10 mg by mouth daily. 10/27/20  Yes [provider]  metFORMIN (GLUCOPHAGE) 500 MG tablet TAKE 1 TABLET (500 MG TOTAL) BY MOUTH 2 (TWO) TIMES DAILY WITH A MEAL. 01/25/21  Yes Christpher Stogsdill, Ines Bloomer, MD  Sodium Sulfate-Mag Sulfate-KCl (SUTAB) [id number] MG TABS Take 24 tablets by mouth as directed. MANUFACTURER CODES!! BIN: [id number] PCN: CN GROUP: [id number] MEMBER ID: [id number];VHQ AS CASH;NO PRIOR AUTHORIZATION 03/11/20  Yes Armbruster, Carlota Raspberry, MD  tamsulosin (FLOMAX) 0.4 MG CAPS capsule TAKE 1 CAPSULE (0.4 MG TOTAL) BY MOUTH DAILY AFTER BREAKFAST. 01/25/21  Yes Horald Pollen, MD    No Known Allergies  Patient Active Problem List   Diagnosis Date Noted   Dyslipidemia associated with type 2 diabetes mellitus ([place]) 01/30/2020   PVD (peripheral vascular disease) ([place]) 01/30/2020    Chronic venous insufficiency 01/30/2020   Benign prostatic hyperplasia 01/30/2020   Tobacco use disorder, continuous 01/30/2020   Smoker 06/08/2013   Other and unspecified hyperlipidemia 06/08/2013   Gout 06/08/2013   Morbid obesity ([place]) 06/08/2013    Past Medical History:  Diagnosis Date   Allergy    Arthritis    Diabetes mellitus without complication (HCC)    GERD (gastroesophageal reflux disease)    Gout    Hyperlipidemia    Sleep apnea    Not on CPAP    Past Surgical History:  Procedure Laterality Date   COLONOSCOPY     sev colon's    POLYPECTOMY      Social History   Socioeconomic History   Marital status: Single    Spouse name: Not on file   Number of children: Not on file   Years of education: Not on file   Highest education level: Not on file  Occupational History   Not on file  Tobacco Use   Smoking status: Every Day    Packs/day: 1.00    Years: 35.00    Pack years: 35.00    Types: Cigarettes   Smokeless tobacco: Never   Tobacco comments:    1-2 cigs a day or more   Substance and Sexual Activity   Alcohol use: Yes    Alcohol/week: 6.0 standard drinks    Types: 4 Glasses of wine, 2 Cans of beer per week   Drug  use: No   Sexual activity: Not on file  Other Topics Concern   Not on file  Social History Narrative   Not on file   Social Determinants of Health   Financial Resource Strain: Not on file  Food Insecurity: Not on file  Transportation Needs: Not on file  Physical Activity: Not on file  Stress: Not on file  Social Connections: Not on file  Intimate Partner Violence: Not on file    Family History  Problem Relation Age of Onset   Cancer Mother    Ovarian cancer Mother    Stomach cancer Mother    Colon cancer Neg Hx    Esophageal cancer Neg Hx    Rectal cancer Neg Hx    Colon polyps Neg Hx      Review of Systems  Constitutional: Negative.  Negative for chills and fever.  HENT: Negative.  Negative for congestion and sore  throat.   Respiratory: Negative.  Negative for cough and shortness of breath.   Cardiovascular: Negative.  Negative for chest pain and palpitations.  Gastrointestinal:  Negative for abdominal pain, nausea and vomiting.  Genitourinary: Negative.  Negative for dysuria and hematuria.  Musculoskeletal:        Chronic left sciatica with recent weakness of left upper thigh muscles  Skin: Negative.  Negative for rash.  Neurological:  Negative for dizziness and headaches.  All other systems reviewed and are negative.  Today's Vitals   03/04/21 1327  BP: (!) 148/86  Pulse: 65  Temp: 98.5 F (36.9 C)  TempSrc: Oral  SpO2: 96%  Weight: 295 lb (133.8 kg)  Height: 6' (1.829 m)   Body mass index is 40.01 kg/m. Wt Readings from Last 3 Encounters:  03/04/21 295 lb (133.8 kg)  08/06/20 (!) 301 lb (136.5 kg)  04/07/20 (!) 307 lb 14.4 oz (139.7 kg)    Physical Exam Vitals reviewed.  Constitutional:      Appearance: He is obese.  HENT:     Head: Normocephalic.  Eyes:     Extraocular Movements: Extraocular movements intact.     Conjunctiva/sclera: Conjunctivae normal.     Pupils: Pupils are equal, round, and reactive to light.  Cardiovascular:     Rate and Rhythm: Normal rate and regular rhythm.     Pulses: Normal pulses.     Heart sounds: Normal heart sounds.  Pulmonary:     Effort: Pulmonary effort is normal.     Breath sounds: Normal breath sounds.  Abdominal:     Palpations: Abdomen is soft.     Tenderness: There is no abdominal tenderness.  Musculoskeletal:     Cervical back: No tenderness.     Comments: Stasis dermatitis skin changes to lower extremities  Lymphadenopathy:     Cervical: No cervical adenopathy.  Skin:    General: Skin is warm and dry.     Capillary Refill: Capillary refill takes less than 2 seconds.  Neurological:     General: No focal deficit present.     Mental Status: He is alert and oriented to person, place, and time.  Psychiatric:        Mood and  Affect: Mood normal.        Behavior: Behavior normal.     ASSESSMENT & PLAN: Problem List Items Addressed This Visit       Cardiovascular and Mediastinum   Chronic venous insufficiency    Stable.  Leg elevation and compression socks recommended.      Relevant Medications  atorvastatin (LIPITOR) 20 MG tablet   lisinopril (ZESTRIL) 20 MG tablet   Hypertension associated with diabetes ([place])    Elevated blood pressure in the office.  Has been off medication for several weeks.  Start lisinopril 20 mg daily. Lab Results  Component Value Date   HGBA1C 6.2 (A) 08/06/2020  We will repeat A1c today.  Diet and nutrition discussed.  Continue metformin 500 mg twice a day.  Follow-up in 6 months.       Relevant Medications   metFORMIN (GLUCOPHAGE) 500 MG tablet   atorvastatin (LIPITOR) 20 MG tablet   lisinopril (ZESTRIL) 20 MG tablet     Endocrine   Dyslipidemia associated with type 2 diabetes mellitus ([place]) - Primary    Lipid profile done today.  Continue atorvastatin 20 mg daily. The 10-year ASCVD risk score (Arnett DK, et al., 2019) is: 30.4%   Values used to calculate the score:     Age: 68 years     Sex: Male     Is Non-Hispanic African American: No     Diabetic: Yes     Tobacco smoker: Yes     Systolic Blood Pressure: 761 mmHg     Is BP treated: No     HDL Cholesterol: 60 mg/dL     Total Cholesterol: 157 mg/dL       Relevant Medications   metFORMIN (GLUCOPHAGE) 500 MG tablet   atorvastatin (LIPITOR) 20 MG tablet   lisinopril (ZESTRIL) 20 MG tablet   Other Relevant Orders   Comprehensive metabolic panel   Hemoglobin A1c   Lipid panel     Genitourinary   Benign prostatic hyperplasia    Stable.  Continue Flomax 0.4 mg at bedtime.        Other   Morbid obesity ([place])    Diet and nutrition discussed.  Advised to decrease amount of daily carbohydrate intake.      Relevant Medications   metFORMIN (GLUCOPHAGE) 500 MG tablet   Tobacco use disorder, continuous     Cardiovascular and cancer risk associated with smoking discussed. Smoking cessation advice given.      Other Visit Diagnoses     Need for influenza vaccination       Relevant Orders   Flu Vaccine QUAD High Dose(Fluad) (Completed)   Left sided sciatica       Relevant Orders   Ambulatory referral to Orthopedic Surgery   History of COVID-19       Recent infection, recovering well   Relevant Orders   SAR CoV2 Serology (COVID 19)AB(IGG)IA      Patient Instructions  Diabetes Mellitus and Nutrition, Adult When you have diabetes, or diabetes mellitus, it is very important to have healthy eating habits because your blood sugar (glucose) levels are greatly affected by what you eat and drink. Eating healthy foods in the right amounts, at about the same times every day, can help you: Control your blood glucose. Lower your risk of heart disease. Improve your blood pressure. Reach or maintain a healthy weight. What can affect my meal plan? Every person with diabetes is different, and each person has different needs for a meal plan. Your health care provider may recommend that you work with a dietitian to make a meal plan that is best for you. Your meal plan may vary depending on factors such as: The calories you need. The medicines you take. Your weight. Your blood glucose, blood pressure, and cholesterol levels. Your activity level. Other health conditions you have, such as heart  or kidney disease. How do carbohydrates affect me? Carbohydrates, also called carbs, affect your blood glucose level more than any other type of food. Eating carbs naturally raises the amount of glucose in your blood. Carb counting is a method for keeping track of how many carbs you eat. Counting carbs is important to keep your blood glucose at a healthy level, especially if you use insulin or take certain oral diabetes medicines. It is important to know how many carbs you can safely have in each meal. This is  different for every person. Your dietitian can help you calculate how many carbs you should have at each meal and for each snack. How does alcohol affect me? Alcohol can cause a sudden decrease in blood glucose (hypoglycemia), especially if you use insulin or take certain oral diabetes medicines. Hypoglycemia can be a life-threatening condition. Symptoms of hypoglycemia, such as sleepiness, dizziness, and confusion, are similar to symptoms of having too much alcohol. Do not drink alcohol if: Your health care provider tells you not to drink. You are pregnant, may be pregnant, or are planning to become pregnant. If you drink alcohol: Do not drink on an empty stomach. Limit how much you use to: 0-1 drink a day for women. 0-2 drinks a day for men. Be aware of how much alcohol is in your drink. In the [place] one drink equals one 12 oz bottle of beer (355 mL), one 5 oz glass of wine (148 mL), or one 1 oz glass of hard liquor (44 mL). Keep yourself hydrated with water, diet soda, or unsweetened iced tea. Keep in mind that regular soda, juice, and other mixers may contain a lot of sugar and must be counted as carbs. What are tips for following this plan? Reading food labels Start by checking the serving size on the "Nutrition Facts" label of packaged foods and drinks. The amount of calories, carbs, fats, and other nutrients listed on the label is based on one serving of the item. Many items contain more than one serving per package. Check the total grams (g) of carbs in one serving. You can calculate the number of servings of carbs in one serving by dividing the total carbs by 15. For example, if a food has 30 g of total carbs per serving, it would be equal to 2 servings of carbs. Check the number of grams (g) of saturated fats and trans fats in one serving. Choose foods that have a low amount or none of these fats. Check the number of milligrams (mg) of salt (sodium) in one serving. Most people should  limit total sodium intake to less than 2,300 mg per day. Always check the nutrition information of foods labeled as "low-fat" or "nonfat." These foods may be higher in added sugar or refined carbs and should be avoided. Talk to your dietitian to identify your daily goals for nutrients listed on the label. Shopping Avoid buying canned, pre-made, or processed foods. These foods tend to be high in fat, sodium, and added sugar. Shop around the outside edge of the grocery store. This is where you will most often find fresh fruits and vegetables, bulk grains, fresh meats, and fresh dairy. Cooking Use low-heat cooking methods, such as baking, instead of high-heat cooking methods like deep frying. Cook using healthy oils, such as olive, canola, or sunflower oil. Avoid cooking with butter, cream, or high-fat meats. Meal planning Eat meals and snacks regularly, preferably at the same times every day. Avoid going long periods of time  without eating. Eat foods that are high in fiber, such as fresh fruits, vegetables, beans, and whole grains. Talk with your dietitian about how many servings of carbs you can eat at each meal. Eat 4-6 oz (112-168 g) of lean protein each day, such as lean meat, chicken, fish, eggs, or tofu. One ounce (oz) of lean protein is equal to: 1 oz (28 g) of meat, chicken, or fish. 1 egg.  cup (62 g) of tofu. Eat some foods each day that contain healthy fats, such as avocado, nuts, seeds, and fish. What foods should I eat? Fruits Berries. Apples. Oranges. Peaches. Apricots. Plums. Grapes. Mango. Papaya. Pomegranate. Kiwi. Cherries. Vegetables Lettuce. Spinach. Leafy greens, including kale, chard, collard greens, and mustard greens. Beets. Cauliflower. Cabbage. Broccoli. Carrots. Green beans. Tomatoes. Peppers. Onions. Cucumbers. Brussels sprouts. Grains Whole grains, such as whole-wheat or whole-grain bread, crackers, tortillas, cereal, and pasta. Unsweetened oatmeal. Quinoa. Brown  or wild rice. Meats and other proteins Seafood. Poultry without skin. Lean cuts of poultry and beef. Tofu. Nuts. Seeds. Dairy Low-fat or fat-free dairy products such as milk, yogurt, and cheese. The items listed above may not be a complete list of foods and beverages you can eat. Contact a dietitian for more information. What foods should I avoid? Fruits Fruits canned with syrup. Vegetables Canned vegetables. Frozen vegetables with butter or cream sauce. Grains Refined white flour and flour products such as bread, pasta, snack foods, and cereals. Avoid all processed foods. Meats and other proteins Fatty cuts of meat. Poultry with skin. Breaded or fried meats. Processed meat. Avoid saturated fats. Dairy Full-fat yogurt, cheese, or milk. Beverages Sweetened drinks, such as soda or iced tea. The items listed above may not be a complete list of foods and beverages you should avoid. Contact a dietitian for more information. Questions to ask a health care provider Do I need to meet with a diabetes educator? Do I need to meet with a dietitian? What number can I call if I have questions? When are the best times to check my blood glucose? Where to find more information: American Diabetes Association: diabetes.org Academy of Nutrition and Dietetics: www.eatright.[employer] of Diabetes and Digestive and Kidney Diseases: [employer] Association of Diabetes Care and Education Specialists: www.diabeteseducator.org Summary It is important to have healthy eating habits because your blood sugar (glucose) levels are greatly affected by what you eat and drink. A healthy meal plan will help you control your blood glucose and maintain a healthy lifestyle. Your health care provider may recommend that you work with a dietitian to make a meal plan that is best for you. Keep in mind that carbohydrates (carbs) and alcohol have immediate effects on your blood glucose levels. It is important  to count carbs and to use alcohol carefully. This information is not intended to replace advice given to you by your health care provider. Make sure you discuss any questions you have with your health care provider. Document Revised: 04/30/2019 Document Reviewed: 04/30/2019 Elsevier Patient Education  2021 [place], MD [facility]

## 2021-03-04 NOTE — Assessment & Plan Note (Signed)
Cardiovascular and cancer risk associated with smoking discussed. Smoking cessation advice given. 

## 2021-03-04 NOTE — Assessment & Plan Note (Signed)
Stable.  Continue Flomax 0.4 mg at bedtime 

## 2021-03-04 NOTE — Assessment & Plan Note (Signed)
Diet and nutrition discussed.  Advised to decrease amount of daily carbohydrate intake. 

## 2021-03-04 NOTE — Assessment & Plan Note (Signed)
Stable.  Leg elevation and compression socks recommended.

## 2021-03-04 NOTE — Patient Instructions (Signed)
Diabetes Mellitus and Nutrition, Adult When you have diabetes, or diabetes mellitus, it is very important to have healthy eating habits because your blood sugar (glucose) levels are greatly affected by what you eat and drink. Eating healthy foods in the right amounts, at about the same times every day, can help you:  Control your blood glucose.  Lower your risk of heart disease.  Improve your blood pressure.  Reach or maintain a healthy weight. What can affect my meal plan? Every person with diabetes is different, and each person has different needs for a meal plan. Your health care provider may recommend that you work with a dietitian to make a meal plan that is best for you. Your meal plan may vary depending on factors such as:  The calories you need.  The medicines you take.  Your weight.  Your blood glucose, blood pressure, and cholesterol levels.  Your activity level.  Other health conditions you have, such as heart or kidney disease. How do carbohydrates affect me? Carbohydrates, also called carbs, affect your blood glucose level more than any other type of food. Eating carbs naturally raises the amount of glucose in your blood. Carb counting is a method for keeping track of how many carbs you eat. Counting carbs is important to keep your blood glucose at a healthy level, especially if you use insulin or take certain oral diabetes medicines. It is important to know how many carbs you can safely have in each meal. This is different for every person. Your dietitian can help you calculate how many carbs you should have at each meal and for each snack. How does alcohol affect me? Alcohol can cause a sudden decrease in blood glucose (hypoglycemia), especially if you use insulin or take certain oral diabetes medicines. Hypoglycemia can be a life-threatening condition. Symptoms of hypoglycemia, such as sleepiness, dizziness, and confusion, are similar to symptoms of having too much  alcohol.  Do not drink alcohol if: ? Your health care provider tells you not to drink. ? You are pregnant, may be pregnant, or are planning to become pregnant.  If you drink alcohol: ? Do not drink on an empty stomach. ? Limit how much you use to:  0-1 drink a day for women.  0-2 drinks a day for men. ? Be aware of how much alcohol is in your drink. In the [place] one drink equals one 12 oz bottle of beer (355 mL), one 5 oz glass of wine (148 mL), or one 1 oz glass of hard liquor (44 mL). ? Keep yourself hydrated with water, diet soda, or unsweetened iced tea.  Keep in mind that regular soda, juice, and other mixers may contain a lot of sugar and must be counted as carbs. What are tips for following this plan? Reading food labels  Start by checking the serving size on the "Nutrition Facts" label of packaged foods and drinks. The amount of calories, carbs, fats, and other nutrients listed on the label is based on one serving of the item. Many items contain more than one serving per package.  Check the total grams (g) of carbs in one serving. You can calculate the number of servings of carbs in one serving by dividing the total carbs by 15. For example, if a food has 30 g of total carbs per serving, it would be equal to 2 servings of carbs.  Check the number of grams (g) of saturated fats and trans fats in one serving. Choose foods that have   a low amount or none of these fats.  Check the number of milligrams (mg) of salt (sodium) in one serving. Most people should limit total sodium intake to less than 2,300 mg per day.  Always check the nutrition information of foods labeled as "low-fat" or "nonfat." These foods may be higher in added sugar or refined carbs and should be avoided.  Talk to your dietitian to identify your daily goals for nutrients listed on the label. Shopping  Avoid buying canned, pre-made, or processed foods. These foods tend to be high in fat, sodium, and added  sugar.  Shop around the outside edge of the grocery store. This is where you will most often find fresh fruits and vegetables, bulk grains, fresh meats, and fresh dairy. Cooking  Use low-heat cooking methods, such as baking, instead of high-heat cooking methods like deep frying.  Cook using healthy oils, such as olive, canola, or sunflower oil.  Avoid cooking with butter, cream, or high-fat meats. Meal planning  Eat meals and snacks regularly, preferably at the same times every day. Avoid going long periods of time without eating.  Eat foods that are high in fiber, such as fresh fruits, vegetables, beans, and whole grains. Talk with your dietitian about how many servings of carbs you can eat at each meal.  Eat 4-6 oz (112-168 g) of lean protein each day, such as lean meat, chicken, fish, eggs, or tofu. One ounce (oz) of lean protein is equal to: ? 1 oz (28 g) of meat, chicken, or fish. ? 1 egg. ?  cup (62 g) of tofu.  Eat some foods each day that contain healthy fats, such as avocado, nuts, seeds, and fish.   What foods should I eat? Fruits Berries. Apples. Oranges. Peaches. Apricots. Plums. Grapes. Mango. Papaya. Pomegranate. Kiwi. Cherries. Vegetables Lettuce. Spinach. Leafy greens, including kale, chard, collard greens, and mustard greens. Beets. Cauliflower. Cabbage. Broccoli. Carrots. Green beans. Tomatoes. Peppers. Onions. Cucumbers. Brussels sprouts. Grains Whole grains, such as whole-wheat or whole-grain bread, crackers, tortillas, cereal, and pasta. Unsweetened oatmeal. Quinoa. Brown or wild rice. Meats and other proteins Seafood. Poultry without skin. Lean cuts of poultry and beef. Tofu. Nuts. Seeds. Dairy Low-fat or fat-free dairy products such as milk, yogurt, and cheese. The items listed above may not be a complete list of foods and beverages you can eat. Contact a dietitian for more information. What foods should I avoid? Fruits Fruits canned with  syrup. Vegetables Canned vegetables. Frozen vegetables with butter or cream sauce. Grains Refined white flour and flour products such as bread, pasta, snack foods, and cereals. Avoid all processed foods. Meats and other proteins Fatty cuts of meat. Poultry with skin. Breaded or fried meats. Processed meat. Avoid saturated fats. Dairy Full-fat yogurt, cheese, or milk. Beverages Sweetened drinks, such as soda or iced tea. The items listed above may not be a complete list of foods and beverages you should avoid. Contact a dietitian for more information. Questions to ask a health care provider  Do I need to meet with a diabetes educator?  Do I need to meet with a dietitian?  What number can I call if I have questions?  When are the best times to check my blood glucose? Where to find more information:  American Diabetes Association: diabetes.org  Academy of Nutrition and Dietetics: www.eatright.org  National Institute of Diabetes and Digestive and Kidney Diseases: www.niddk.nih.gov  Association of Diabetes Care and Education Specialists: www.diabeteseducator.org Summary  It is important to have healthy eating   habits because your blood sugar (glucose) levels are greatly affected by what you eat and drink.  A healthy meal plan will help you control your blood glucose and maintain a healthy lifestyle.  Your health care provider may recommend that you work with a dietitian to make a meal plan that is best for you.  Keep in mind that carbohydrates (carbs) and alcohol have immediate effects on your blood glucose levels. It is important to count carbs and to use alcohol carefully. This information is not intended to replace advice given to you by your health care provider. Make sure you discuss any questions you have with your health care provider. Document Revised: 04/30/2019 Document Reviewed: 04/30/2019 Elsevier Patient Education  2021 Elsevier Inc.  

## 2021-03-05 LAB — SAR COV2 SEROLOGY (COVID19)AB(IGG),IA
SARS-CoV-2 Semi-Quant IgG Ab: >800 AU/mL (ref <13.0)
SARS-CoV-2 Spike Ab Interp: POSITIVE

## 2021-03-25 ENCOUNTER — Other Ambulatory Visit: Payer: Self-pay

## 2021-03-25 ENCOUNTER — Ambulatory Visit (INDEPENDENT_AMBULATORY_CARE_PROVIDER_SITE_OTHER): Payer: Medicare Other | Admitting: Vascular Surgery

## 2021-03-25 ENCOUNTER — Encounter: Payer: Self-pay | Admitting: Vascular Surgery

## 2021-03-25 ENCOUNTER — Ambulatory Visit (HOSPITAL_COMMUNITY)
Admission: RE | Admit: 2021-03-25 | Discharge: 2021-03-25 | Disposition: A | Discharge status: Home / Self Care | Payer: Medicare Other | Source: Ambulatory Visit | Attending: Vascular Surgery | Admitting: Vascular Surgery

## 2021-03-25 VITALS — BP 98/66 | HR 73 | Temp 98.2°F | Resp 20 | Ht 72.0 in | Wt 293.0 lb

## 2021-03-25 DIAGNOSIS — I872 Venous insufficiency (chronic) (peripheral): Secondary | ICD-10-CM

## 2021-03-25 NOTE — Progress Notes (Signed)
REASON FOR VISIT:   Follow-up of chronic venous insufficiency.  MEDICAL ISSUES:   CHRONIC VENOUS INSUFFICIENCY: I think that some of the symptoms in his left leg can be attributed to his chronic venous insufficiency.  He has CEAP C4c venous disease (corona phlebectatica).  He has some deep venous reflux in the common femoral vein and some superficial venous reflux in the small saphenous vein although this vein is not dilated.  I do not think addressing his small saphenous vein reflux would have a significant impact on his symptoms.  We have discussed the importance of intermittent leg elevation and the proper positioning for this.  I have recommended a knee-high compression stocking with a gradient of 15 to 20 mmHg.  Certainly if he tolerates a gradient of 20-30 that would be fine to although most people are less likely to wear these.  I have also encouraged him to exercise and avoid prolonged sitting and standing.  We specifically discussed walking and water aerobics.  In addition I discussed the importance of maintaining a healthy weight as abdominal obesity especially increases lower extremity venous pressure.  If his symptoms progress in the future I be happy to reevaluate him at any time.  HPI:   Gabriel Fernandez is a pleasant 65 y.o. male who was seen by Dr. Marjean Donna with chronic venous insufficiency on 04/07/2020.  The patient had significant painful varicose veins.  At the time of this visit he had normal ABIs.  Dr. Stanford Breed recommended a venous duplex scan  The patient was then lost to follow-up but returns today for a venous duplex scan.  He describes some aching pain in the left thigh and leg.  He is also being evaluated for sciatica.  I do not get any clear-cut history of calf or thigh claudication.  It sounds like his symptoms are aggravated by standing somewhat.  He does elevate his legs some but this does not have a big impact on his symptoms.  I am not sure that he is doing this  correctly.  His symptoms are not necessarily worse at the end of the day.  He does do a fair amount of sitting at his job and also drives for 3 hours often.  Past Medical History:  Diagnosis Date   Allergy    Arthritis    Diabetes mellitus without complication (HCC)    GERD (gastroesophageal reflux disease)    Gout    Hyperlipidemia    Sleep apnea    Not on CPAP    Family History  Problem Relation Age of Onset   Cancer Mother    Ovarian cancer Mother    Stomach cancer Mother    Colon cancer Neg Hx    Esophageal cancer Neg Hx    Rectal cancer Neg Hx    Colon polyps Neg Hx     SOCIAL HISTORY: Social History   Tobacco Use   Smoking status: Some Days    Packs/day: 0.25    Years: 35.00    Pack years: 8.75    Types: Cigarettes   Smokeless tobacco: Never   Tobacco comments:    1-2 cigs a day or more   Substance Use Topics   Alcohol use: Yes    Alcohol/week: 6.0 standard drinks    Types: 4 Glasses of wine, 2 Cans of beer per week    No Known Allergies  Current Outpatient Medications  Medication Sig Dispense Refill   atorvastatin (LIPITOR) 20 MG tablet Take 1  tablet (20 mg total) by mouth daily. 90 tablet 3   lisinopril (ZESTRIL) 20 MG tablet Take 1 tablet (20 mg total) by mouth daily. 90 tablet 3   metFORMIN (GLUCOPHAGE) 500 MG tablet Take 1 tablet (500 mg total) by mouth 2 (two) times daily with a meal. 180 tablet 3   tamsulosin (FLOMAX) 0.4 MG CAPS capsule TAKE 1 CAPSULE (0.4 MG TOTAL) BY MOUTH DAILY AFTER BREAKFAST. 90 capsule 3   No current facility-administered medications for this visit.    REVIEW OF SYSTEMS:  [X]  denotes positive finding, [ ]  denotes negative finding Cardiac  Comments:  Chest pain or chest pressure:    Shortness of breath upon exertion:    Short of breath when lying flat:    Irregular heart rhythm:        Vascular    Pain in calf, thigh, or hip brought on by ambulation: x   Pain in feet at night that wakes you up from your sleep:      Blood clot in your veins:    Leg swelling:         Pulmonary    Oxygen at home:    Productive cough:     Wheezing:         Neurologic    Sudden weakness in arms or legs:     Sudden numbness in arms or legs:     Sudden onset of difficulty speaking or slurred speech:    Temporary loss of vision in one eye:     Problems with dizziness:         Gastrointestinal    Blood in stool:     Vomited blood:         Genitourinary    Burning when urinating:     Blood in urine:        Psychiatric    Major depression:         Hematologic    Bleeding problems:    Problems with blood clotting too easily:        Skin    Rashes or ulcers:        Constitutional    Fever or chills:     PHYSICAL EXAM:   Vitals:   03/25/21 1557  BP: 98/66  Pulse: 73  Resp: 20  Temp: 98.2 F (36.8 C)  SpO2: 94%  Weight: 293 lb (132.9 kg)  Height: 6' (1.829 m)    GENERAL: The patient is a well-nourished male, in no acute distress. The vital signs are documented above. CARDIAC: There is a regular rate and rhythm.  VASCULAR: I do not detect carotid bruits. He had palpable posterior tibial pulses bilaterally.  He had biphasic posterior tibial signals with the Doppler and biphasic anterior tibial signals. He had corona phlebectatica bilat.   He had some small varicose veins along medial aspect of his left thigh and left leg.    PULMONARY: There is good air exchange bilaterally without wheezing or rales. ABDOMEN: Soft and non-tender with normal pitched bowel sounds.  MUSCULOSKELETAL: There are no major deformities or cyanosis. NEUROLOGIC: No focal weakness or paresthesias are detected. SKIN: There are no ulcers or rashes noted. PSYCHIATRIC: The patient has a normal affect.  DATA:    VENOUS DUPLEX: I have independently interpreted his venous duplex scan that was done today.  This was of the left lower extremity only.  There was no evidence of DVT.  There was deep venous reflux in the common  femoral vein.  There  was superficial venous reflux in the small saphenous vein to the mid calf however the vein was not especially dilated.  Diameters ranged from 3 to 4 mm.  The great saphenous vein had been previously ablated.     Deitra Mayo [facility] [phone]

## 2021-03-26 ENCOUNTER — Ambulatory Visit (INDEPENDENT_AMBULATORY_CARE_PROVIDER_SITE_OTHER): Payer: Medicare Other | Admitting: Orthopaedic Surgery

## 2021-03-26 ENCOUNTER — Ambulatory Visit: Payer: Self-pay

## 2021-03-26 ENCOUNTER — Encounter: Payer: Self-pay | Admitting: Orthopaedic Surgery

## 2021-03-26 DIAGNOSIS — M65331 Trigger finger, right middle finger: Secondary | ICD-10-CM

## 2021-03-26 DIAGNOSIS — M1612 Unilateral primary osteoarthritis, left hip: Secondary | ICD-10-CM

## 2021-03-26 NOTE — Progress Notes (Signed)
Office Visit Note   Patient: Gabriel Fernandez           Date of Birth: [birth year]           MRN: [id number] Visit Date: 03/26/2021              Requested by: Gabriel Fernandez, [place],  [place] [id number] PCP: Gabriel Pollen, MD   Assessment & Plan: Visit Diagnoses:  1. Primary osteoarthritis of left hip   2. Trigger finger, right middle finger     Plan: Impression is advanced degenerative joint disease left hip and right long trigger finger.  In regards to the left hip pain, believe his symptoms are actually coming from the hip rather than his back based on clinical exam and x-ray findings.  We have discussed referral to Dr. Ernestina Fernandez for left hip injection versus left total hip arthroplasty.  He would like to first try the injection.  He will follow-up with [place] should he fail to get relief of his symptoms.  In regards to the trigger finger, we discussed cortisone injection in addition to A1 pulley release.  He would like to first control the pain in his hip and we will then address his finger.  Follow-Up Instructions: Return if symptoms worsen or fail to improve.   Orders:  Orders Placed This Encounter  Procedures   XR Pelvis 1-2 Views   XR Lumbar Spine 2-3 Views   Ambulatory referral to Physical Medicine Rehab   No orders of the defined types were placed in this encounter.     Procedures: No procedures performed   Clinical Data: No additional findings.   Subjective: Chief Complaint  Patient presents with   Left Hip - Pain    HPI patient is a pleasant 65 year old gentleman who comes in today with left anterior lateral thigh pain for the past year.  He describes this as a dull ache worse with walking as well as going down the stairs.  He does occasionally note weakness to the left lower extremity as well.  He denies any groin or buttock pain.  He does not take medication for this.  He does note occasional numbness to the side as well.  No  previous left hip or lumbar spine injection.  Other issue he brings up today is pain and triggering to the right long finger.  This is been ongoing for the past few years.  No previous injection or surgical intervention.  Review of Systems as detailed in HPI.  All others reviewed and are negative.   Objective: Vital Signs: There were no vitals taken for this visit.  Physical Exam well-developed well-nourished gentleman in no acute distress.  Alert and oriented x3.  Ortho Exam left hip exam reveals a positive logroll and FADIR with little internal rotation.  Minimally positive straight leg raise.  No pain with lumbar flexion, extension or rotation.  No focal weakness.  He is neurovascular intact distally.  Right long finger exam shows reproducible triggering.  Minimal pain to the A1 pulley.  He is neurovascular intact distally.  Specialty Comments:  No specialty comments available.  Imaging: XR Lumbar Spine 2-3 Views  Result Date: 03/26/2021 Degenerative changes L5-S1  XR Pelvis 1-2 Views  Result Date: 03/26/2021 Significant degenerative changes to the left hip joint  VAS [place] LOWER EXTREMITY VENOUS REFLUX  Result Date: 03/25/2021  Lower Venous Reflux Study Patient Name:  Gabriel Fernandez  Date of Exam:   03/25/2021 Medical  Rec #: [id number]         Accession #:    [id number] Date of Birth: 04-08-1956          Patient Gender: M Patient Age:   85 years Exam Location:  [facility] Procedure:      VAS [place] LOWER EXTREMITY VENOUS REFLUX Referring Phys: Jamelle Haring --------------------------------------------------------------------------------  Indications: Varicosities left lower extremity. Other Indications: History of left lower extremity venous procedure years ago. Limitations: Body habitus and poor ultrasound/tissue interface. Performing Technologist: Alvia Grove RVT  Examination Guidelines: A complete evaluation includes B-mode imaging, spectral Doppler, color Doppler, and  power Doppler as needed of all accessible portions of each vessel. Bilateral testing is considered an integral part of a complete examination. Limited examinations for reoccurring indications may be performed as noted. The reflux portion of the exam is performed with the patient in reverse Trendelenburg. Significant venous reflux is defined as >500 ms in the superficial venous system, and >1 second in the deep venous system.  +--------------+---------+------+-----------+------------+--------+ LEFT          Reflux NoRefluxReflux TimeDiameter cmsComments                         Yes                                  +--------------+---------+------+-----------+------------+--------+ CFV                     yes   >1 second                      +--------------+---------+------+-----------+------------+--------+ FV mid        no                                             +--------------+---------+------+-----------+------------+--------+ Popliteal     no                                             +--------------+---------+------+-----------+------------+--------+ GSV at SFJ              yes    >500 ms      1.0              +--------------+---------+------+-----------+------------+--------+ GSV prox thigh                                      NV       +--------------+---------+------+-----------+------------+--------+ GSV mid thigh                                       NV       +--------------+---------+------+-----------+------------+--------+ GSV dist thigh                                      NV       +--------------+---------+------+-----------+------------+--------+ GSV at knee  NV       +--------------+---------+------+-----------+------------+--------+ GSV prox calf no                            0.18             +--------------+---------+------+-----------+------------+--------+ GSV mid calf  no                             0.26             +--------------+---------+------+-----------+------------+--------+ SSV Pop Fossa           yes    >500 ms      0.38             +--------------+---------+------+-----------+------------+--------+ SSV prox calf           yes    >500 ms      0.31             +--------------+---------+------+-----------+------------+--------+ SSV mid calf            yes    >500 ms      0.41             +--------------+---------+------+-----------+------------+--------+ AASV p        no                                    NV       +--------------+---------+------+-----------+------------+--------+   Summary: Left: - No evidence of deep vein thrombosis seen in the left lower extremity, from the common femoral through the popliteal veins. - Venous reflux is noted in the left common femoral vein. - Venous reflux is noted in the left sapheno-femoral junction. - Venous reflux is noted in the left short saphenous vein.  *See table(s) above for measurements and observations. Electronically signed by Deitra Mayo MD on 03/25/2021 at 4:58:28 PM.    Final      PMFS History: Patient Active Problem List   Diagnosis Date Noted   Hypertension associated with diabetes ([place]) 03/04/2021   Dyslipidemia associated with type 2 diabetes mellitus ([place]) 01/30/2020   PVD (peripheral vascular disease) ([place]) 01/30/2020   Chronic venous insufficiency 01/30/2020   Benign prostatic hyperplasia 01/30/2020   Tobacco use disorder, continuous 01/30/2020   Smoker 06/08/2013   Other and unspecified hyperlipidemia 06/08/2013   Gout 06/08/2013   Morbid obesity ([place]) 06/08/2013   Past Medical History:  Diagnosis Date   Allergy    Arthritis    Diabetes mellitus without complication (HCC)    GERD (gastroesophageal reflux disease)    Gout    Hyperlipidemia    Sleep apnea    Not on CPAP    Family History  Problem Relation Age of Onset   Cancer Mother    Ovarian cancer  Mother    Stomach cancer Mother    Colon cancer Neg Hx    Esophageal cancer Neg Hx    Rectal cancer Neg Hx    Colon polyps Neg Hx     Past Surgical History:  Procedure Laterality Date   COLONOSCOPY     sev colon's    POLYPECTOMY     Social History   Occupational History   Not on file  Tobacco Use   Smoking status: Some Days    Packs/day: 0.25    Years: 35.00    Pack years: 8.75  Types: Cigarettes   Smokeless tobacco: Never   Tobacco comments:    1-2 cigs a day or more   Substance and Sexual Activity   Alcohol use: Yes    Alcohol/week: 6.0 standard drinks    Types: 4 Glasses of wine, 2 Cans of beer per week   Drug use: No   Sexual activity: Not on file

## 2021-03-30 ENCOUNTER — Telehealth: Payer: Self-pay | Admitting: Physical Medicine and Rehabilitation

## 2021-03-30 NOTE — Telephone Encounter (Signed)
Patient called. Returning a call to [place] 

## 2021-04-07 ENCOUNTER — Encounter: Payer: Self-pay | Admitting: Physical Medicine and Rehabilitation

## 2021-04-07 ENCOUNTER — Ambulatory Visit: Payer: Self-pay

## 2021-04-07 ENCOUNTER — Ambulatory Visit (INDEPENDENT_AMBULATORY_CARE_PROVIDER_SITE_OTHER): Payer: Medicare Other | Admitting: Physical Medicine and Rehabilitation

## 2021-04-07 ENCOUNTER — Other Ambulatory Visit: Payer: Self-pay

## 2021-04-07 DIAGNOSIS — M25552 Pain in left hip: Secondary | ICD-10-CM

## 2021-04-07 NOTE — Progress Notes (Signed)
Pt state left hip pain that ravels to the outer left thigh. Pt state walking, standing and laying down makes the pain worse. Pt state he takes pain meds to help ease his pain.  Numeric Pain Rating Scale and Functional Assessment Average Pain 6   In the last MONTH (on 0-10 scale) has pain interfered with the following?  1. General activity like being  able to carry out your everyday physical activities such as walking, climbing stairs, carrying groceries, or moving a chair?  Rating(8)    -BT, -Dye Allergies.

## 2021-04-07 NOTE — Progress Notes (Signed)
   Gabriel Fernandez - 65 y.o. male MRN [id number]  Date of birth: 06/04/1956  Office Visit Note: Visit Date: 04/07/2021 PCP: Horald Pollen, MD Referred by: Horald Pollen, *  Subjective: Chief Complaint  Patient presents with   Left Hip - Pain   Left Thigh - Pain   HPI:  Gabriel Fernandez is a 65 y.o. male who comes in today at the request of Dr. Eduard Roux for planned Left anesthetic hip arthrogram with fluoroscopic guidance.  The patient has failed conservative care including home exercise, medications, time and activity modification.  This injection will be diagnostic and hopefully therapeutic.  Please see requesting physician notes for further details and justification.     ROS Otherwise per HPI.  Assessment & Plan: Visit Diagnoses:    ICD-10-CM   1. Pain in left hip  M25.552 Large Joint Inj: L hip joint    XR C-ARM NO REPORT      Plan: No additional findings.   Meds & Orders: No orders of the defined types were placed in this encounter.   Orders Placed This Encounter  Procedures   Large Joint Inj: L hip joint   XR C-ARM NO REPORT    Follow-up: Return for visit to requesting physician as needed.   Procedures: Large Joint Inj: L hip joint on 04/07/2021 3:45 PM Indications: diagnostic evaluation and pain Details: 22 G 3.5 in needle, fluoroscopy-guided anterior approach  Arthrogram: No  Medications: 4 mL bupivacaine 0.25 %; 60 mg triamcinolone acetonide 40 MG/ML Outcome: tolerated well, no immediate complications  There was excellent flow of contrast producing a partial arthrogram of the hip. The patient did have relief of symptoms during the anesthetic phase of the injection. Procedure, treatment alternatives, risks and benefits explained, specific risks discussed. Consent was given by the patient. Immediately prior to procedure a time out was called to verify the correct patient, procedure, equipment, support staff and site/side marked as required.  Patient was prepped and draped in the usual sterile fashion.         Clinical History: No specialty comments available.     Objective:  VS:  HT:    WT:   BMI:     BP:   HR: bpm  TEMP: ( )  RESP:  Physical Exam   Imaging: No results found.

## 2021-04-10 MED ORDER — BUPIVACAINE HCL 0.25 % IJ SOLN
4.0000 mL | INTRAMUSCULAR | Status: AC | PRN
Start: 2021-04-07 — End: 2021-04-07
  Administered 2021-04-07: 4 mL via INTRA_ARTICULAR

## 2021-04-10 MED ORDER — TRIAMCINOLONE ACETONIDE 40 MG/ML IJ SUSP
60.0000 mg | INTRAMUSCULAR | Status: AC | PRN
Start: 2021-04-07 — End: 2021-04-07
  Administered 2021-04-07: 60 mg via INTRA_ARTICULAR

## 2021-06-10 DIAGNOSIS — H905 Unspecified sensorineural hearing loss: Secondary | ICD-10-CM | POA: Diagnosis not present

## 2021-07-20 ENCOUNTER — Ambulatory Visit: Payer: Medicare Other | Admitting: Orthopaedic Surgery

## 2021-07-21 ENCOUNTER — Ambulatory Visit: Payer: PRIVATE HEALTH INSURANCE | Admitting: Orthopaedic Surgery

## 2021-07-27 ENCOUNTER — Encounter: Payer: Self-pay | Admitting: Orthopaedic Surgery

## 2021-07-27 ENCOUNTER — Other Ambulatory Visit: Payer: Self-pay

## 2021-07-27 ENCOUNTER — Ambulatory Visit: Payer: Medicare Other | Admitting: Orthopaedic Surgery

## 2021-07-27 VITALS — Ht 72.0 in | Wt 299.0 lb

## 2021-07-27 DIAGNOSIS — E1165 Type 2 diabetes mellitus with hyperglycemia: Secondary | ICD-10-CM | POA: Diagnosis not present

## 2021-07-27 DIAGNOSIS — M1612 Unilateral primary osteoarthritis, left hip: Secondary | ICD-10-CM

## 2021-07-27 NOTE — Progress Notes (Signed)
Office Visit Note   Patient: Gabriel Fernandez           Date of Birth: 11/05/1955           MRN: [id number] Visit Date: 07/27/2021              Requested by: Horald Pollen, MD [place],  [place] [id number] PCP: Horald Pollen, MD   Assessment & Plan: Visit Diagnoses:  1. Primary osteoarthritis of left hip     Plan: Impression is end-stage left hip DJD.  Unfortunately the cortisone injection did not provide any significant relief.  Based on his options he would like to move forward with a left placement in the near future.  We did talk about the importance of losing weight to improve his BMI to less than 40.  He will do everything he can to cut back on his smoking and we will redraw an A1c today.  Total face to face encounter time was greater than 25 minutes and over half of this time was spent in counseling and/or coordination of care.  Follow-Up Instructions: No follow-ups on file.   Orders:  Orders Placed This Encounter  Procedures   HgB A1c   No orders of the defined types were placed in this encounter.     Procedures: No procedures performed   Clinical Data: No additional findings.   Subjective: Chief Complaint  Patient presents with   Left Hip - Follow-up    HPI  Gabriel Fernandez returns today for follow-up of severe left hip pain.  He has known severe DJD.  He underwent a cortisone injection in November which helped for about 2 days.  He continues to have severe difficulty with ADLs and nighttime pain.  He is unable to work at his job effectively as a result.  He is at risk for falling.  Review of Systems  Constitutional: Negative.   All other systems reviewed and are negative.   Objective: Vital Signs: Ht 6' (1.829 m)    Wt 299 lb (135.6 kg)    BMI 40.55 kg/m   Physical Exam Vitals and nursing note reviewed.  Constitutional:      Appearance: He is well-developed.  Pulmonary:     Effort: Pulmonary effort is normal.  Abdominal:      Palpations: Abdomen is soft.  Skin:    General: Skin is warm.  Neurological:     Mental Status: He is alert and oriented to person, place, and time.  Psychiatric:        Behavior: Behavior normal.        Thought Content: Thought content normal.        Judgment: Judgment normal.    Ortho Exam  Examination left hip shows severe pain with any attempted range of motion which is very limited.  Severe pain with flexion past 40 degrees.  Positive logroll positive [place].  Antalgic gait.  Specialty Comments:  No specialty comments available.  Imaging: No results found.   PMFS History: Patient Active Problem List   Diagnosis Date Noted   Hypertension associated with diabetes ([place]) 03/04/2021   Dyslipidemia associated with type 2 diabetes mellitus ([place]) 01/30/2020   PVD (peripheral vascular disease) ([place]) 01/30/2020   Chronic venous insufficiency 01/30/2020   Benign prostatic hyperplasia 01/30/2020   Tobacco use disorder, continuous 01/30/2020   Smoker 06/08/2013   Other and unspecified hyperlipidemia 06/08/2013   Gout 06/08/2013   Morbid obesity ([place]) 06/08/2013   Past Medical History:  Diagnosis Date   Allergy    Arthritis    Diabetes mellitus without complication (HCC)    GERD (gastroesophageal reflux disease)    Gout    Hyperlipidemia    Sleep apnea    Not on CPAP    Family History  Problem Relation Age of Onset   Cancer Mother    Ovarian cancer Mother    Stomach cancer Mother    Colon cancer Neg Hx    Esophageal cancer Neg Hx    Rectal cancer Neg Hx    Colon polyps Neg Hx     Past Surgical History:  Procedure Laterality Date   COLONOSCOPY     sev colon's    POLYPECTOMY     Social History   Occupational History   Not on file  Tobacco Use   Smoking status: Some Days    Packs/day: 0.25    Years: 35.00    Pack years: 8.75    Types: Cigarettes   Smokeless tobacco: Never   Tobacco comments:    1-2 cigs a day or more   Substance and Sexual  Activity   Alcohol use: Yes    Alcohol/week: 6.0 standard drinks    Types: 4 Glasses of wine, 2 Cans of beer per week   Drug use: No   Sexual activity: Not on file

## 2021-07-28 LAB — HEMOGLOBIN A1C
Hgb A1c MFr Bld: 6 % of total Hgb — ABNORMAL HIGH (ref <5.7)
Mean Plasma Glucose: 126 mg/dL
eAG (mmol/L): 7 mmol/L

## 2021-08-19 ENCOUNTER — Other Ambulatory Visit: Payer: Self-pay

## 2021-09-02 ENCOUNTER — Ambulatory Visit (INDEPENDENT_AMBULATORY_CARE_PROVIDER_SITE_OTHER): Payer: Medicare Other | Admitting: Emergency Medicine

## 2021-09-02 ENCOUNTER — Encounter: Payer: Self-pay | Admitting: Emergency Medicine

## 2021-09-02 VITALS — BP 136/66 | HR 77 | Temp 98.3°F | Ht 72.0 in | Wt 293.5 lb

## 2021-09-02 DIAGNOSIS — E785 Hyperlipidemia, unspecified: Secondary | ICD-10-CM | POA: Diagnosis not present

## 2021-09-02 DIAGNOSIS — E1169 Type 2 diabetes mellitus with other specified complication: Secondary | ICD-10-CM

## 2021-09-02 DIAGNOSIS — E1159 Type 2 diabetes mellitus with other circulatory complications: Secondary | ICD-10-CM

## 2021-09-02 DIAGNOSIS — N400 Enlarged prostate without lower urinary tract symptoms: Secondary | ICD-10-CM | POA: Diagnosis not present

## 2021-09-02 DIAGNOSIS — I152 Hypertension secondary to endocrine disorders: Secondary | ICD-10-CM

## 2021-09-02 DIAGNOSIS — I872 Venous insufficiency (chronic) (peripheral): Secondary | ICD-10-CM | POA: Diagnosis not present

## 2021-09-02 MED ORDER — TAMSULOSIN HCL 0.4 MG PO CAPS: 0.4000 mg | ORAL_CAPSULE | Freq: Every day | ORAL | 3 refills | Status: DC

## 2021-09-02 MED ORDER — ATORVASTATIN CALCIUM 20 MG PO TABS: 20.0000 mg | ORAL_TABLET | Freq: Every day | ORAL | 3 refills | Status: DC

## 2021-09-02 MED ORDER — LISINOPRIL 20 MG PO TABS: 20.0000 mg | ORAL_TABLET | Freq: Every day | ORAL | 3 refills | Status: DC

## 2021-09-02 NOTE — Assessment & Plan Note (Signed)
Stable.  Continue atorvastatin 20 mg daily. 

## 2021-09-02 NOTE — Assessment & Plan Note (Signed)
Stable without significant edema. ?

## 2021-09-02 NOTE — Assessment & Plan Note (Signed)
Stable.  Continue Flomax 0.4 mg daily. ?Still has some urinary frequency/urgency but flow is a lot better ?

## 2021-09-02 NOTE — Assessment & Plan Note (Signed)
Well-controlled hypertension.  Continue lisinopril 20 mg daily. ?BP Readings from Last 3 Encounters:  ?09/02/21 136/66  ?03/25/21 98/66  ?03/04/21 138/80  ?Well-controlled diabetes with hemoglobin A1c of 6.0. ?Continue metformin 500 mg twice a day. ?Diet and nutrition discussed. ?Cardiovascular risks associated with hypertension and diabetes discussed. ? ?

## 2021-09-02 NOTE — Progress Notes (Signed)
Lab Results  ?Component Value Date  ? HGBA1C 6.0 (H) 07/27/2021  ? ?BP Readings from Last 3 Encounters:  ?03/25/21 98/66  ?03/04/21 138/80  ?08/06/20 108/71  ? ?The 10-year ASCVD risk score (Arnett DK, et al., 2019) is: 21.3% ?  Values used to calculate the score: ?    Age: 66 years ?    Sex: Male ?    Is Non-Hispanic African American: No ?    Diabetic: Yes ?    Tobacco smoker: Yes ?    Systolic Blood Pressure: 98 mmHg ?    Is BP treated: No ?    HDL Cholesterol: 48.6 mg/dL ?    Total Cholesterol: 161 mg/dL ?Gabriel Fernandez ?66 y.o. ? ? ?Chief Complaint  ?Patient presents with  ? Follow-up  ?  No concerns   ? ? ?HISTORY OF PRESENT ILLNESS: ?This is a 66 y.o. male with history of hypertension, dyslipidemia and diabetes, here for follow-up. ?Has no complaints or medical concerns today. ?Scheduled for hip surgery next month. ? ?Medication Refill ?Pertinent negatives include no abdominal pain, chest pain, chills, congestion, coughing, fever, headaches, nausea, rash, sore throat or vomiting.  ? ? ?Prior to Admission medications   ?Medication Sig Start Date End Date Taking? Authorizing Provider  ?lisinopril (ZESTRIL) 20 MG tablet Take 1 tablet (20 mg total) by mouth daily. 03/04/21  Yes Horald Pollen, MD  ?metFORMIN (GLUCOPHAGE) 500 MG tablet Take 1 tablet (500 mg total) by mouth 2 (two) times daily with a meal. 03/04/21  Yes Takeila Thayne, Ines Bloomer, MD  ?tamsulosin (FLOMAX) 0.4 MG CAPS capsule TAKE 1 CAPSULE (0.4 MG TOTAL) BY MOUTH DAILY AFTER BREAKFAST. 01/25/21  Yes Gulianna Hornsby, Ines Bloomer, MD  ?atorvastatin (LIPITOR) 20 MG tablet Take 1 tablet (20 mg total) by mouth daily. 03/04/21 06/02/21  Horald Pollen, MD  ? ? ?No Known Allergies ? ?Patient Active Problem List  ? Diagnosis Date Noted  ? Hypertension associated with diabetes (Gabriel Fernandez) 03/04/2021  ? Dyslipidemia associated with type 2 diabetes mellitus (Gabriel Fernandez) 01/30/2020  ? PVD (peripheral vascular disease) ([place]) 01/30/2020  ? Chronic venous insufficiency  01/30/2020  ? Benign prostatic hyperplasia 01/30/2020  ? Tobacco use disorder, continuous 01/30/2020  ? Smoker 06/08/2013  ? Other and unspecified hyperlipidemia 06/08/2013  ? Gout 06/08/2013  ? Morbid obesity (Gabriel Fernandez) 06/08/2013  ? ? ?Past Medical History:  ?Diagnosis Date  ? Allergy   ? Arthritis   ? Diabetes mellitus without complication (Gabriel Fernandez)   ? GERD (gastroesophageal reflux disease)   ? Gout   ? Hyperlipidemia   ? Sleep apnea   ? Not on CPAP  ? ? ?Past Surgical History:  ?Procedure Laterality Date  ? COLONOSCOPY    ? sev colon's   ? POLYPECTOMY    ? ? ?Social History  ? ?Socioeconomic History  ? Marital status: Single  ?  Spouse name: Not on file  ? Number of children: Not on file  ? Years of education: Not on file  ? Highest education level: Not on file  ?Occupational History  ? Not on file  ?Tobacco Use  ? Smoking status: Some Days  ?  Packs/day: 0.25  ?  Years: 35.00  ?  Pack years: 8.75  ?  Types: Cigarettes  ? Smokeless tobacco: Never  ? Tobacco comments:  ?  1-2 cigs a day or more   ?Substance and Sexual Activity  ? Alcohol use: Yes  ?  Alcohol/week: 6.0 standard drinks  ?  Types: 4 Glasses of  wine, 2 Cans of beer per week  ? Drug use: No  ? Sexual activity: Not on file  ?Other Topics Concern  ? Not on file  ?Social History Narrative  ? Not on file  ? ?Social Determinants of Health  ? ?Financial Resource Strain: Not on file  ?Food Insecurity: Not on file  ?Transportation Needs: Not on file  ?Physical Activity: Not on file  ?Stress: Not on file  ?Social Connections: Not on file  ?Intimate Partner Violence: Not on file  ? ? ?Family History  ?Problem Relation Age of Onset  ? Cancer Mother   ? Ovarian cancer Mother   ? Stomach cancer Mother   ? Colon cancer Neg Hx   ? Esophageal cancer Neg Hx   ? Rectal cancer Neg Hx   ? Colon polyps Neg Hx   ? ? ? ?Review of Systems  ?Constitutional: Negative.  Negative for chills and fever.  ?HENT: Negative.  Negative for congestion and sore throat.   ?Respiratory: Negative.   Negative for cough and shortness of breath.   ?Cardiovascular: Negative.  Negative for chest pain and palpitations.  ?Gastrointestinal:  Negative for abdominal pain, diarrhea, nausea and vomiting.  ?Genitourinary: Negative.   ?Musculoskeletal:  Positive for joint pain (Hip pain).  ?Skin: Negative.  Negative for rash.  ?Neurological: Negative.  Negative for dizziness and headaches.  ?All other systems reviewed and are negative. ? ?Today's Vitals  ? 09/02/21 1307  ?BP: 136/66  ?Pulse: 77  ?Temp: 98.3 ?F (36.8 ?C)  ?TempSrc: Oral  ?SpO2: 97%  ? ?There is no height or weight on file to calculate BMI. ? ?Physical Exam ?Vitals reviewed.  ?Constitutional:   ?   Appearance: Normal appearance.  ?HENT:  ?   Head: Normocephalic.  ?Eyes:  ?   Extraocular Movements: Extraocular movements intact.  ?   Pupils: Pupils are equal, round, and reactive to light.  ?Cardiovascular:  ?   Rate and Rhythm: Normal rate and regular rhythm.  ?   Pulses: Normal pulses.  ?   Heart sounds: Normal heart sounds.  ?Pulmonary:  ?   Effort: Pulmonary effort is normal.  ?   Breath sounds: Normal breath sounds.  ?Musculoskeletal:  ?   Cervical back: No tenderness.  ?Lymphadenopathy:  ?   Cervical: No cervical adenopathy.  ?Skin: ?   General: Skin is warm and dry.  ?   Capillary Refill: Capillary refill takes less than 2 seconds.  ?   Comments: Chronic venous insufficiency dermatitis changes of skin noted in lower extremities  ?Neurological:  ?   General: No focal deficit present.  ?   Mental Status: He is alert and oriented to person, place, and time.  ?Psychiatric:     ?   Mood and Affect: Mood normal.     ?   Behavior: Behavior normal.  ? ? ? ?ASSESSMENT & PLAN: ?Problem List Items Addressed This Visit   ? ?  ? Cardiovascular and Mediastinum  ? Chronic venous insufficiency  ?  Stable without significant edema. ?  ?  ? Relevant Medications  ? atorvastatin (LIPITOR) 20 MG tablet  ? lisinopril (ZESTRIL) 20 MG tablet  ? Hypertension associated with  diabetes (Gabriel Fernandez)  ?  Well-controlled hypertension.  Continue lisinopril 20 mg daily. ?BP Readings from Last 3 Encounters:  ?09/02/21 136/66  ?03/25/21 98/66  ?03/04/21 138/80  ?Well-controlled diabetes with hemoglobin A1c of 6.0. ?Continue metformin 500 mg twice a day. ?Diet and nutrition discussed. ?Cardiovascular risks associated with hypertension  and diabetes discussed. ? ?  ?  ? Relevant Medications  ? atorvastatin (LIPITOR) 20 MG tablet  ? lisinopril (ZESTRIL) 20 MG tablet  ?  ? Endocrine  ? Dyslipidemia associated with type 2 diabetes mellitus ([place]) - Primary  ?  Stable.  Continue atorvastatin 20 mg daily. ?  ?  ? Relevant Medications  ? atorvastatin (LIPITOR) 20 MG tablet  ? lisinopril (ZESTRIL) 20 MG tablet  ?  ? Genitourinary  ? Benign prostatic hyperplasia  ?  Stable.  Continue Flomax 0.4 mg daily. ?Still has some urinary frequency/urgency but flow is a lot better ?  ?  ? Relevant Medications  ? tamsulosin (FLOMAX) 0.4 MG CAPS capsule  ? ?Patient Instructions  ?Health Maintenance After Age 57 ?After age 46, you are at a higher risk for certain long-term diseases and infections as well as injuries from Fernandez. Fernandez are a major cause of broken bones and head injuries in people who are older than age 16. Getting regular preventive care can help to keep you healthy and well. Preventive care includes getting regular testing and making lifestyle changes as recommended by your health care provider. Talk with your health care provider about: ?Which screenings and tests you should have. A screening is a test that checks for a disease when you have no symptoms. ?A diet and exercise plan that is right for you. ?What should I know about screenings and tests to prevent Fernandez? ?Screening and testing are the best ways to find a health problem early. Early diagnosis and treatment give you the best chance of managing medical conditions that are common after age 18. Certain conditions and lifestyle choices may make you more  likely to have a fall. Your health care provider may recommend: ?Regular vision checks. Poor vision and conditions such as cataracts can make you more likely to have a fall. If you wear glasses, make sure to get y

## 2021-09-02 NOTE — Patient Instructions (Signed)
Health Maintenance After Age 68 After age 68, you are at a higher risk for certain long-term diseases and infections as well as injuries from falls. Falls are a major cause of broken bones and head injuries in people who are older than age 68. Getting regular preventive care can help to keep you healthy and well. Preventive care includes getting regular testing and making lifestyle changes as recommended by your health care provider. Talk with your health care provider about: Which screenings and tests you should have. A screening is a test that checks for a disease when you have no symptoms. A diet and exercise plan that is right for you. What should I know about screenings and tests to prevent falls? Screening and testing are the best ways to find a health problem early. Early diagnosis and treatment give you the best chance of managing medical conditions that are common after age 68. Certain conditions and lifestyle choices may make you more likely to have a fall. Your health care provider may recommend: Regular vision checks. Poor vision and conditions such as cataracts can make you more likely to have a fall. If you wear glasses, make sure to get your prescription updated if your vision changes. Medicine review. Work with your health care provider to regularly review all of the medicines you are taking, including over-the-counter medicines. Ask your health care provider about any side effects that may make you more likely to have a fall. Tell your health care provider if any medicines that you take make you feel dizzy or sleepy. Strength and balance checks. Your health care provider may recommend certain tests to check your strength and balance while standing, walking, or changing positions. Foot health exam. Foot pain and numbness, as well as not wearing proper footwear, can make you more likely to have a fall. Screenings, including: Osteoporosis screening. Osteoporosis is a condition that causes  the bones to get weaker and break more easily. Blood pressure screening. Blood pressure changes and medicines to control blood pressure can make you feel dizzy. Depression screening. You may be more likely to have a fall if you have a fear of falling, feel depressed, or feel unable to do activities that you used to do. Alcohol use screening. Using too much alcohol can affect your balance and may make you more likely to have a fall. Follow these instructions at home: Lifestyle Do not drink alcohol if: Your health care provider tells you not to drink. If you drink alcohol: Limit how much you have to: 0-1 drink a day for women. 0-2 drinks a day for men. Know how much alcohol is in your drink. In the [place] one drink equals one 12 oz bottle of beer (355 mL), one 5 oz glass of wine (148 mL), or one 1 oz glass of hard liquor (44 mL). Do not use any products that contain nicotine or tobacco. These products include cigarettes, chewing tobacco, and vaping devices, such as e-cigarettes. If you need help quitting, ask your health care provider. Activity  Follow a regular exercise program to stay fit. This will help you maintain your balance. Ask your health care provider what types of exercise are appropriate for you. If you need a cane or walker, use it as recommended by your health care provider. Wear supportive shoes that have nonskid soles. Safety  Remove any tripping hazards, such as rugs, cords, and clutter. Install safety equipment such as grab bars in bathrooms and safety rails on stairs. Keep rooms and walkways   well-lit. General instructions Talk with your health care provider about your risks for falling. Tell your health care provider if: You fall. Be sure to tell your health care provider about all falls, even ones that seem minor. You feel dizzy, tiredness (fatigue), or off-balance. Take over-the-counter and prescription medicines only as told by your health care provider. These include  supplements. Eat a healthy diet and maintain a healthy weight. A healthy diet includes low-fat dairy products, low-fat (lean) meats, and fiber from whole grains, beans, and lots of fruits and vegetables. Stay current with your vaccines. Schedule regular health, dental, and eye exams. Summary Having a healthy lifestyle and getting preventive care can help to protect your health and wellness after age 68. Screening and testing are the best way to find a health problem early and help you avoid having a fall. Early diagnosis and treatment give you the best chance for managing medical conditions that are more common for people who are older than age 68. Falls are a major cause of broken bones and head injuries in people who are older than age 68. Take precautions to prevent a fall at home. Work with your health care provider to learn what changes you can make to improve your health and wellness and to prevent falls. This information is not intended to replace advice given to you by your health care provider. Make sure you discuss any questions you have with your health care provider. Document Revised: 10/12/2020 Document Reviewed: 10/12/2020 Elsevier Patient Education  2022 [employer]  

## 2021-09-09 NOTE — Pre-Procedure Instructions (Signed)
Surgical Instructions ? ? ? Your procedure is scheduled on Monday, September 20, 2021 at 11:45 AM. ? Report to [facility] Main Entrance "A" at 9:45 A.M., then check in with the Admitting office. ? Call this number if you have problems the morning of surgery: ? [phone] ? ? If you have any questions prior to your surgery date call [phone]: Open Monday-Friday 8am-4pm ? ? ? Remember: ? Do not eat after midnight the night before your surgery ? ?You may drink clear liquids until 8:45 AM the morning of your surgery.   ?Clear liquids allowed are: Water, Non-Citrus Juices (without pulp), Carbonated Beverages, Clear Tea, Black Coffee Only (NO MILK, CREAM OR POWDERED CREAMER of any kind), and Gatorade. ? ? ?Enhanced Recovery after Surgery for Orthopedics ?Enhanced Recovery after Surgery is a protocol used to improve the stress on your body and your recovery after surgery. ? ?Patient Instructions ? ? ?The day of surgery (if you have diabetes): ? ?Drink ONE bottle of G2 by 8:45 am the morning of surgery ?This bottle was given to you during your hospital  ?pre-op appointment visit.  ?Nothing else to drink after completing the  ?G2. ? ?       If you have questions, please contact your surgeon?s office. ? ?  ? Take these medicines the morning of surgery with A SIP OF WATER: ? ?atorvastatin (LIPITOR) ?tamsulosin [facility]) ? ? ?As of today, STOP taking any Aspirin (unless otherwise instructed by your surgeon) Aleve, Naproxen, Ibuprofen, Motrin, Advil, Goody's, BC's, all herbal medications, fish oil, and all vitamins. ? ?WHAT DO I DO ABOUT MY DIABETES MEDICATION? ? ? ?Do not take metFORMIN (GLUCOPHAGE) the morning of surgery. ? ? ?HOW TO MANAGE YOUR DIABETES ?BEFORE AND AFTER SURGERY ? ?Why is it important to control my blood sugar before and after surgery? ?Improving blood sugar levels before and after surgery helps healing and can limit problems. ?A way of improving blood sugar control is eating a healthy diet by: ? Eating less  sugar and carbohydrates ? Increasing activity/exercise ? Talking with your doctor about reaching your blood sugar goals ?High blood sugars (greater than 180 mg/dL) can raise your risk of infections and slow your recovery, so you will need to focus on controlling your diabetes during the weeks before surgery. ?Make sure that the doctor who takes care of your diabetes knows about your planned surgery including the date and location. ? ?How do I manage my blood sugar before surgery? ?Check your blood sugar at least 4 times a day, starting 2 days before surgery, to make sure that the level is not too high or low. ? ?Check your blood sugar the morning of your surgery when you wake up and every 2 hours until you get to the Short Stay unit. ? ?If your blood sugar is less than 70 mg/dL, you will need to treat for low blood sugar: ?Do not take insulin. ?Treat a low blood sugar (less than 70 mg/dL) with ? cup of clear juice (cranberry or apple), 4 glucose tablets, OR glucose gel. ?Recheck blood sugar in 15 minutes after treatment (to make sure it is greater than 70 mg/dL). If your blood sugar is not greater than 70 mg/dL on recheck, call [phone] for further instructions. ?Report your blood sugar to the short stay nurse when you get to Short Stay. ? ?If you are admitted to the hospital after surgery: ?Your blood sugar will be checked by the staff and you will probably be given insulin  after surgery (instead of oral diabetes medicines) to make sure you have good blood sugar levels. ?The goal for blood sugar control after surgery is 80-180 mg/dL. ? ?         ?           ?Do NOT Smoke (Tobacco/Vaping) for 24 hours prior to your procedure. ? ?If you use a CPAP at night, you may bring your mask/headgear for your overnight stay. ?  ?Contacts, glasses, piercing's, hearing aid's, dentures or partials may not be worn into surgery, please bring cases for these belongings.  ?  ?For patients admitted to the hospital, discharge time  will be determined by your treatment team. ?  ?Patients discharged the day of surgery will not be allowed to drive home, and someone needs to stay with them for 24 hours. ? ?SURGICAL WAITING ROOM VISITATION ?Patients having surgery or a procedure may have two support people in the waiting area. ?Visitors may stay in the waiting area during the procedure and switch out with other visitors if needed. ?Children under the age of 37 must have an adult accompany them who is not the patient. ?If the patient needs to stay at the hospital during part of their recovery, the visitor guidelines for inpatient rooms apply. ? ?Please refer to the [facility] website for the visitor guidelines for Inpatients (after your surgery is over and you are in a regular room).  ? ? ?Special instructions:   ?[facility]- Preparing For Surgery ? ?Before surgery, you can play an important role. Because skin is not sterile, your skin needs to be as free of germs as possible. You can reduce the number of germs on your skin by washing with CHG (chlorahexidine gluconate) Soap before surgery.  CHG is an antiseptic cleaner which kills germs and bonds with the skin to continue killing germs even after washing.   ? ?Oral Hygiene is also important to reduce your risk of infection.  Remember - BRUSH YOUR TEETH THE MORNING OF SURGERY WITH YOUR REGULAR TOOTHPASTE ? ?Please do not use if you have an allergy to CHG or antibacterial soaps. If your skin becomes reddened/irritated stop using the CHG.  ?Do not shave (including legs and underarms) for at least 48 hours prior to first CHG shower. It is OK to shave your face. ? ?Please follow these instructions carefully. ?  ?Shower the NIGHT BEFORE SURGERY and the MORNING OF SURGERY ? ?If you chose to wash your hair, wash your hair first as usual with your normal shampoo. ? ?After you shampoo, rinse your hair and body thoroughly to remove the shampoo. ? ?Use CHG Soap as you would any other liquid soap. You can  apply CHG directly to the skin and wash gently with a scrungie or a clean washcloth.  ? ?Apply the CHG Soap to your body ONLY FROM THE NECK DOWN.  Do not use on open wounds or open sores. Avoid contact with your eyes, ears, mouth and genitals (private parts). Wash Face and genitals (private parts)  with your normal soap.  ? ?Wash thoroughly, paying special attention to the area where your surgery will be performed. ? ?Thoroughly rinse your body with warm water from the neck down. ? ?DO NOT shower/wash with your normal soap after using and rinsing off the CHG Soap. ? ?Pat yourself dry with a CLEAN TOWEL. ? ?Wear CLEAN PAJAMAS to bed the night before surgery ? ?Place CLEAN SHEETS on your bed the night before your surgery ? ?DO NOT  SLEEP WITH PETS. ? ? ?Day of Surgery: ?Take a shower with CHG soap. ?Do not wear jewelry. ?Do not wear lotions, powders, colognes, or deodorant. ?Do not shave 48 hours prior to surgery.  Men may shave face and neck. ?Do not bring valuables to the hospital.  ?[facility] is not responsible for any belongings or valuables. ?Wear Clean/Comfortable clothing the morning of surgery ?Do not apply any deodorants/lotions.   ?Remember to brush your teeth WITH YOUR REGULAR TOOTHPASTE. ?  ?Please read over the following fact sheets that you were given. ? ?If you received a COVID test during your pre-op visit  it is requested that you wear a mask when out in public, stay away from anyone that may not be feeling well and notify your surgeon if you develop symptoms. If you have been in contact with anyone that has tested positive in the last 10 days please notify you surgeon. ?  ? ?

## 2021-09-10 ENCOUNTER — Other Ambulatory Visit: Payer: Self-pay

## 2021-09-10 ENCOUNTER — Encounter (HOSPITAL_COMMUNITY): Payer: Self-pay

## 2021-09-10 ENCOUNTER — Encounter (HOSPITAL_COMMUNITY)
Admission: RE | Admit: 2021-09-10 | Discharge: 2021-09-10 | Disposition: A | Discharge status: Home / Self Care | Payer: Medicare Other | Source: Ambulatory Visit | Attending: Orthopaedic Surgery | Admitting: Orthopaedic Surgery

## 2021-09-10 VITALS — BP 114/63 | HR 74 | Temp 97.5°F | Resp 18 | Ht 72.0 in | Wt 292.6 lb

## 2021-09-10 DIAGNOSIS — Z01818 Encounter for other preprocedural examination: Secondary | ICD-10-CM | POA: Diagnosis not present

## 2021-09-10 DIAGNOSIS — M1612 Unilateral primary osteoarthritis, left hip: Secondary | ICD-10-CM | POA: Insufficient documentation

## 2021-09-10 HISTORY — DX: Unspecified hearing loss, unspecified ear: H91.90

## 2021-09-10 LAB — COMPREHENSIVE METABOLIC PANEL
ALT: 23 U/L (ref 0–44)
AST: 28 U/L (ref 15–41)
Albumin: 3.6 g/dL (ref 3.5–5.0)
Alkaline Phosphatase: 63 U/L (ref 38–126)
Anion gap: 8 (ref 5–15)
BUN: 14 mg/dL (ref 8–23)
CO2: 21 mmol/L — ABNORMAL LOW (ref 22–32)
Calcium: 9.5 mg/dL (ref 8.9–10.3)
Chloride: 101 mmol/L (ref 98–111)
Creatinine, Ser: 0.87 mg/dL (ref 0.61–1.24)
GFR, Estimated: >60 mL/min (ref >60)
Glucose, Bld: 108 mg/dL — ABNORMAL HIGH (ref 70–99)
Potassium: 5.7 mmol/L — ABNORMAL HIGH (ref 3.5–5.1)
Sodium: 130 mmol/L — ABNORMAL LOW (ref 135–145)
Total Bilirubin: 1.7 mg/dL — ABNORMAL HIGH (ref 0.3–1.2)
Total Protein: 6.5 g/dL (ref 6.5–8.1)

## 2021-09-10 LAB — CBC
HCT: 43 % (ref 39.0–52.0)
Hemoglobin: 15 g/dL (ref 13.0–17.0)
MCH: 33 pg (ref 26.0–34.0)
MCHC: 34.9 g/dL (ref 30.0–36.0)
MCV: 94.5 fL (ref 80.0–100.0)
Platelets: 275 10*3/uL (ref 150–400)
RBC: 4.55 MIL/uL (ref 4.22–5.81)
RDW: 13.2 % (ref 11.5–15.5)
WBC: 7 10*3/uL (ref 4.0–10.5)
nRBC: 0 % (ref 0.0–0.2)

## 2021-09-10 LAB — SURGICAL PCR SCREEN
MRSA, PCR: NEGATIVE
Staphylococcus aureus: NEGATIVE

## 2021-09-10 NOTE — Progress Notes (Signed)
DUE TO COVID-19 ONLY TWO VISITORS ARE ALLOWED TO COME WITH YOU AND STAY IN THE SURGICAL WAITING ROOM ONLY DURING PRE OP AND PROCEDURE DAY OF SURGERY.  ? ?Two VISITORS MAY VISIT WITH YOU AFTER SURGERY IN YOUR PRIVATE ROOM DURING VISITING HOURS ONLY! ? ? ?PCP - Dr Agustina Caroli ?Cardiologist - n/a ? ?Chest x-ray - n/a ?EKG - 09/10/21 ?Stress Test - n/a ?ECHO - n/a ?Cardiac Cath - n/a ? ?ICD Pacemaker/Loop - n/a ? ?Sleep Study -  Yes ?CPAP - does not use cpap ? ?Do not take Metformin on the morning of surgery. ? ?If your blood sugar is less than 70 mg/dL, you will need to treat for low blood sugar: ?Treat a low blood sugar (less than 70 mg/dL) with ? cup of clear juice (cranberry or apple), 4 glucose tablets, OR glucose gel. ?Recheck blood sugar in 15 minutes after treatment (to make sure it is greater than 70 mg/dL). If your blood sugar is not greater than 70 mg/dL on recheck, call [phone] for further instructions. ? ?ERAS: Clear liquids til 8:45 AM DOS. ? ?Anesthesia review: Yes ? ?STOP now taking any Aspirin (unless otherwise instructed by your surgeon), Aleve, Naproxen, Ibuprofen, Motrin, Advil, Goody's, BC's, all herbal medications, fish oil, and all vitamins.  ? ?Coronavirus Screening ?Do you have any of the following symptoms:  ?Cough yes/no: No ?Fever (>100.21F)  yes/no: No ?Runny nose yes/no: No ?Sore throat yes/no: No ?Difficulty breathing/shortness of breath  yes/no: No ? ?Have you traveled in the last 14 days and where? yes/no: No ? ?Patient verbalized understanding of instructions that were given to them at the PAT appointment. Patient was also instructed that they will need to review over the PAT instructions again at home before surgery. ? ?

## 2021-09-13 ENCOUNTER — Other Ambulatory Visit: Payer: Self-pay | Admitting: Physician Assistant

## 2021-09-13 MED ORDER — OXYCODONE-ACETAMINOPHEN 5-325 MG PO TABS: 1.0000 | ORAL_TABLET | Freq: Four times a day (QID) | ORAL | 0 refills | Status: DC | PRN

## 2021-09-13 MED ORDER — ONDANSETRON HCL 4 MG PO TABS: 4.0000 mg | ORAL_TABLET | Freq: Three times a day (TID) | ORAL | 0 refills | Status: DC | PRN

## 2021-09-13 MED ORDER — ASPIRIN EC 81 MG PO TBEC: 81.0000 mg | DELAYED_RELEASE_TABLET | Freq: Two times a day (BID) | ORAL | 0 refills | Status: DC

## 2021-09-13 MED ORDER — DOCUSATE SODIUM 100 MG PO CAPS: 100.0000 mg | ORAL_CAPSULE | Freq: Every day | ORAL | 2 refills | Status: DC | PRN

## 2021-09-13 MED ORDER — METHOCARBAMOL 500 MG PO TABS: 500.0000 mg | ORAL_TABLET | Freq: Two times a day (BID) | ORAL | 2 refills | Status: DC | PRN

## 2021-09-13 NOTE — Progress Notes (Signed)
Could you call patient this am and let him know potassium is 5.7.  I would like for him to call pcp to let them know and to see how they would like to treat before surgery next monday

## 2021-09-13 NOTE — Progress Notes (Signed)
Called patient. He is aware.  

## 2021-09-15 ENCOUNTER — Ambulatory Visit (INDEPENDENT_AMBULATORY_CARE_PROVIDER_SITE_OTHER): Payer: Medicare Other | Admitting: Family Medicine

## 2021-09-15 ENCOUNTER — Encounter: Payer: Self-pay | Admitting: Family Medicine

## 2021-09-15 VITALS — BP 120/70 | HR 93 | Temp 97.6°F | Wt 289.0 lb

## 2021-09-15 DIAGNOSIS — I152 Hypertension secondary to endocrine disorders: Secondary | ICD-10-CM

## 2021-09-15 DIAGNOSIS — E1159 Type 2 diabetes mellitus with other circulatory complications: Secondary | ICD-10-CM

## 2021-09-15 DIAGNOSIS — E871 Hypo-osmolality and hyponatremia: Secondary | ICD-10-CM | POA: Diagnosis not present

## 2021-09-15 DIAGNOSIS — E875 Hyperkalemia: Secondary | ICD-10-CM

## 2021-09-15 LAB — COMPREHENSIVE METABOLIC PANEL
ALT: 20 U/L (ref 0–53)
AST: 16 U/L (ref 0–37)
Albumin: 4.2 g/dL (ref 3.5–5.2)
Alkaline Phosphatase: 65 U/L (ref 39–117)
BUN: 14 mg/dL (ref 6–23)
CO2: 28 mEq/L (ref 19–32)
Calcium: 9.5 mg/dL (ref 8.4–10.5)
Chloride: 101 mEq/L (ref 96–112)
Creatinine, Ser: 0.89 mg/dL (ref 0.40–1.50)
GFR: 88.21 mL/min (ref >60.00)
Glucose, Bld: 148 mg/dL — ABNORMAL HIGH (ref 70–99)
Potassium: 4.6 mEq/L (ref 3.5–5.1)
Sodium: 135 mEq/L (ref 135–145)
Total Bilirubin: 0.5 mg/dL (ref 0.2–1.2)
Total Protein: 7.2 g/dL (ref 6.0–8.3)

## 2021-09-15 NOTE — Patient Instructions (Signed)
Go to the lab on the first floor for blood work before you leave.  ? ?Cut back on beer and increase your sodium intake for the next few days.  ? ?We will be in touch with your lab results.  ?

## 2021-09-15 NOTE — Progress Notes (Signed)
? ?Subjective:  ? ? Patient ID: Gabriel Fernandez, male    DOB: Oct 26, 1955, 66 y.o.   MRN: [id number] ? ?HPI ?Chief Complaint  ?Patient presents with  ? Procedure  ?  Pt has hip surgery on Monday and his potassium was high so they told him to come here  ? ?He is here for hyperkalemia and hyponatremia found on pre-op labs.  ?He is scheduled for a total hip replacement next week.  ?Dr. Erlinda Hong is doing his surgery.  ? ?DM- Hgb A1c was 6.0% on 07/27/2021 ? ?States he has been drinking a lot of beer. Is not taking a potassium supplement.  ? ?Denies fever, chills, dizziness, chest pain, palpitations, shortness of breath, abdominal pain, N/V/D, urinary symptoms, LE edema. No myalgias.  ? ?Past Medical History:  ?Diagnosis Date  ? Allergy   ? seasonal allergies  ? Arthritis   ? 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 GERD (gastroesophageal reflux disease)   ? Gout   ? Hearing loss   ? dos not use hearing aids  ? Hyperlipidemia   ? Sleep apnea   ? Not on CPAP  ? ?Current Outpatient Medications on File Prior to Visit  ?Medication Sig Dispense Refill  ? aspirin EC 81 MG tablet Take 1 tablet (81 mg total) by mouth 2 (two) times daily. To be taken after surgery to prevent blood 84 tablet 0  ? atorvastatin (LIPITOR) 20 MG tablet Take 1 tablet (20 mg total) by mouth daily. 90 tablet 3  ? docusate sodium (COLACE) 100 MG capsule Take 1 capsule (100 mg total) by mouth daily as needed. 30 capsule 2  ? lisinopril (ZESTRIL) 20 MG tablet Take 1 tablet (20 mg total) by mouth daily. 90 tablet 3  ? metFORMIN (GLUCOPHAGE) 500 MG tablet Take 1 tablet (500 mg total) by mouth 2 (two) times daily with a meal. 180 tablet 3  ? methocarbamol (ROBAXIN) 500 MG tablet Take 1 tablet (500 mg total) by mouth 2 (two) times daily as needed. To be taken after surgery 20 tablet 2  ? ondansetron (ZOFRAN) 4 MG tablet Take 1 tablet (4 mg total) by mouth every 8 (eight) hours as needed for nausea or vomiting. 40 tablet 0  ? oxyCODONE-acetaminophen  (PERCOCET) 5-325 MG tablet Take 1-2 tablets by mouth every 6 (six) hours as needed. To be taken after surgery 40 tablet 0  ? tamsulosin (FLOMAX) 0.4 MG CAPS capsule Take 1 capsule (0.4 mg total) by mouth daily after breakfast. 90 capsule 3  ? ?No current facility-administered medications on file prior to visit.  ? ? ? ?Reviewed allergies, medications, past medical, surgical, family, and social history. ? ? ? ?Review of Systems ?Pertinent positives and negatives in the history of present illness. ? ?   ?Objective:  ? Physical Exam ?BP 120/70 (BP Location: Left Arm, Patient Position: Sitting, Cuff Size: Large)   Pulse 93   Temp 97.6 ?F (36.4 ?C) (Temporal)   Wt 289 lb (131.1 kg)   SpO2 98%   BMI 39.20 kg/m?  ? ?Alert and in no distress.  Cardiac exam shows a regular rate and rhythm without murmurs or gallops. Lungs are clear to auscultation. Skin is warm and dry.  ? ? ? ?   ?Assessment & Plan:  ?Hyperkalemia - Plan: Comprehensive metabolic panel, Comprehensive metabolic panel, CANCELED: Comprehensive metabolic panel ? ?Hyponatremia - Plan: Comprehensive metabolic panel, Comprehensive metabolic panel, CANCELED: Comprehensive metabolic panel ? ?Hypertension associated with diabetes ([place]) ? ?  He is asymptomatic. Will repeat labs and follow up as appropriate. Encourage him to cut back on beer and increase sodium intake for the next few days.  ?

## 2021-09-16 ENCOUNTER — Encounter (HOSPITAL_COMMUNITY): Payer: Self-pay

## 2021-09-17 MED ORDER — TRANEXAMIC ACID 1000 MG/10ML IV SOLN
2000.0000 mg | INTRAVENOUS | Status: DC
  Filled 2021-09-17: qty 20

## 2021-09-19 ENCOUNTER — Telehealth: Payer: Self-pay | Admitting: *Deleted

## 2021-09-19 NOTE — Care Plan (Signed)
[facility] RNCM call to patient to discuss his upcoming Left total hip arthroplasty with Dr. Erlinda Hong. He is an Ortho bundle patient through [facility] and is agreeable to case management. He lives with his wife, who will be assisting him at home. He has a RW that was his wife's. He is going to see if this works for him and if not, CM will order a new one prior to discharge. He states they have elevated toilets at his home now. Anticipate HHPT will be needed after a short hospital stay. Referral made to [facility] after choice provided. Will continue to follow for needs. ?

## 2021-09-19 NOTE — Telephone Encounter (Signed)
Ortho bundle pre-op call completed. 

## 2021-09-20 ENCOUNTER — Ambulatory Visit (HOSPITAL_BASED_OUTPATIENT_CLINIC_OR_DEPARTMENT_OTHER): Payer: Medicare Other | Admitting: Certified Registered"

## 2021-09-20 ENCOUNTER — Observation Stay (HOSPITAL_COMMUNITY): Payer: Medicare Other

## 2021-09-20 ENCOUNTER — Ambulatory Visit (HOSPITAL_COMMUNITY): Payer: Medicare Other | Admitting: Vascular Surgery

## 2021-09-20 ENCOUNTER — Observation Stay (HOSPITAL_COMMUNITY)
Admission: RE | Admit: 2021-09-20 | Discharge: 2021-09-21 | Disposition: A | Discharge status: Home Health | Payer: Medicare Other | Attending: Orthopaedic Surgery | Admitting: Orthopaedic Surgery

## 2021-09-20 ENCOUNTER — Ambulatory Visit (HOSPITAL_COMMUNITY): Payer: Medicare Other

## 2021-09-20 ENCOUNTER — Encounter (HOSPITAL_COMMUNITY)
Admission: RE | Disposition: A | Discharge status: Home Health | Payer: Self-pay | Source: Home / Self Care | Attending: Orthopaedic Surgery

## 2021-09-20 ENCOUNTER — Encounter (HOSPITAL_COMMUNITY): Payer: Self-pay | Admitting: Orthopaedic Surgery

## 2021-09-20 ENCOUNTER — Other Ambulatory Visit: Payer: Self-pay

## 2021-09-20 DIAGNOSIS — M1611 Unilateral primary osteoarthritis, right hip: Secondary | ICD-10-CM | POA: Diagnosis present

## 2021-09-20 DIAGNOSIS — Z79899 Other long term (current) drug therapy: Secondary | ICD-10-CM | POA: Insufficient documentation

## 2021-09-20 DIAGNOSIS — F17210 Nicotine dependence, cigarettes, uncomplicated: Secondary | ICD-10-CM | POA: Diagnosis not present

## 2021-09-20 DIAGNOSIS — M1612 Unilateral primary osteoarthritis, left hip: Secondary | ICD-10-CM | POA: Diagnosis not present

## 2021-09-20 DIAGNOSIS — E1151 Type 2 diabetes mellitus with diabetic peripheral angiopathy without gangrene: Secondary | ICD-10-CM | POA: Diagnosis not present

## 2021-09-20 DIAGNOSIS — I10 Essential (primary) hypertension: Secondary | ICD-10-CM

## 2021-09-20 DIAGNOSIS — Z7984 Long term (current) use of oral hypoglycemic drugs: Secondary | ICD-10-CM

## 2021-09-20 DIAGNOSIS — Z471 Aftercare following joint replacement surgery: Secondary | ICD-10-CM | POA: Diagnosis not present

## 2021-09-20 DIAGNOSIS — E119 Type 2 diabetes mellitus without complications: Secondary | ICD-10-CM | POA: Diagnosis not present

## 2021-09-20 DIAGNOSIS — Z96642 Presence of left artificial hip joint: Secondary | ICD-10-CM

## 2021-09-20 HISTORY — PX: TOTAL HIP ARTHROPLASTY: SHX124

## 2021-09-20 LAB — GLUCOSE, CAPILLARY
Glucose-Capillary: 123 mg/dL — ABNORMAL HIGH (ref 70–99)
Glucose-Capillary: 122 mg/dL — ABNORMAL HIGH (ref 70–99)
Glucose-Capillary: 101 mg/dL — ABNORMAL HIGH (ref 70–99)
Glucose-Capillary: 204 mg/dL — ABNORMAL HIGH (ref 70–99)

## 2021-09-20 SURGERY — ARTHROPLASTY, HIP, TOTAL, ANTERIOR APPROACH
Anesthesia: Spinal | Site: Hip | Laterality: Left

## 2021-09-20 MED ORDER — OXYCODONE HCL 5 MG PO TABS
5.0000 mg | ORAL_TABLET | ORAL | Status: DC | PRN
  Administered 2021-09-20 – 2021-09-21 (×3): 10 mg via ORAL
  Filled 2021-09-20 (×3): qty 2

## 2021-09-20 MED ORDER — ONDANSETRON HCL 4 MG PO TABS: 4.0000 mg | ORAL_TABLET | Freq: Four times a day (QID) | ORAL | Status: DC | PRN

## 2021-09-20 MED ORDER — BUPIVACAINE IN DEXTROSE 0.75-8.25 % IT SOLN
INTRATHECAL | Status: DC | PRN
  Administered 2021-09-20: 2 mL via INTRATHECAL

## 2021-09-20 MED ORDER — SODIUM CHLORIDE 0.9 % IV SOLN: INTRAVENOUS | Status: DC

## 2021-09-20 MED ORDER — BUPIVACAINE-MELOXICAM ER 400-12 MG/14ML IJ SOLN
INTRAMUSCULAR | Status: AC
  Filled 2021-09-20: qty 1

## 2021-09-20 MED ORDER — SORBITOL 70 % SOLN: 30.0000 mL | Freq: Every day | Status: DC | PRN

## 2021-09-20 MED ORDER — PROMETHAZINE HCL 25 MG/ML IJ SOLN: 6.2500 mg | INTRAMUSCULAR | Status: DC | PRN

## 2021-09-20 MED ORDER — LACTATED RINGERS IV SOLN: INTRAVENOUS | Status: DC

## 2021-09-20 MED ORDER — PROPOFOL 500 MG/50ML IV EMUL
INTRAVENOUS | Status: DC | PRN
  Administered 2021-09-20: 100 ug/kg/min via INTRAVENOUS

## 2021-09-20 MED ORDER — TRANEXAMIC ACID-NACL 1000-0.7 MG/100ML-% IV SOLN
1000.0000 mg | Freq: Once | INTRAVENOUS | Status: AC
Start: 2021-09-20 — End: 2021-09-21
  Administered 2021-09-20: 1000 mg via INTRAVENOUS
  Filled 2021-09-20: qty 100

## 2021-09-20 MED ORDER — METHOCARBAMOL 1000 MG/10ML IJ SOLN
500.0000 mg | Freq: Four times a day (QID) | INTRAVENOUS | Status: DC | PRN
  Filled 2021-09-20: qty 5

## 2021-09-20 MED ORDER — ORAL CARE MOUTH RINSE: 15.0000 mL | Freq: Once | OROMUCOSAL | Status: DC

## 2021-09-20 MED ORDER — METHOCARBAMOL 500 MG PO TABS
500.0000 mg | ORAL_TABLET | Freq: Four times a day (QID) | ORAL | Status: DC | PRN
  Administered 2021-09-20 – 2021-09-21 (×3): 500 mg via ORAL
  Filled 2021-09-20 (×3): qty 1

## 2021-09-20 MED ORDER — LISINOPRIL 20 MG PO TABS
20.0000 mg | ORAL_TABLET | Freq: Every day | ORAL | Status: DC
  Administered 2021-09-20 – 2021-09-21 (×2): 20 mg via ORAL
  Filled 2021-09-20 (×2): qty 1

## 2021-09-20 MED ORDER — ACETAMINOPHEN 500 MG PO TABS
1000.0000 mg | ORAL_TABLET | Freq: Four times a day (QID) | ORAL | Status: DC
  Administered 2021-09-20 – 2021-09-21 (×3): 1000 mg via ORAL
  Filled 2021-09-20 (×3): qty 2

## 2021-09-20 MED ORDER — CEFAZOLIN SODIUM-DEXTROSE 2-4 GM/100ML-% IV SOLN
2.0000 g | Freq: Four times a day (QID) | INTRAVENOUS | Status: AC
  Administered 2021-09-20 (×2): 2 g via INTRAVENOUS
  Filled 2021-09-20 (×2): qty 100

## 2021-09-20 MED ORDER — INSULIN ASPART 100 UNIT/ML IJ SOLN
0.0000 [IU] | Freq: Three times a day (TID) | INTRAMUSCULAR | Status: DC
  Administered 2021-09-21 (×2): 3 [IU] via SUBCUTANEOUS

## 2021-09-20 MED ORDER — VANCOMYCIN HCL 1000 MG IV SOLR
INTRAVENOUS | Status: AC
  Filled 2021-09-20: qty 20

## 2021-09-20 MED ORDER — ONDANSETRON HCL 4 MG/2ML IJ SOLN
INTRAMUSCULAR | Status: DC | PRN
Start: 2021-09-20 — End: 2021-09-20
  Administered 2021-09-20: 4 mg via INTRAVENOUS

## 2021-09-20 MED ORDER — TRANEXAMIC ACID-NACL 1000-0.7 MG/100ML-% IV SOLN
1000.0000 mg | INTRAVENOUS | Status: AC
  Administered 2021-09-20: 1000 mg via INTRAVENOUS
  Filled 2021-09-20: qty 100

## 2021-09-20 MED ORDER — ALUM & MAG HYDROXIDE-SIMETH 200-200-20 MG/5ML PO SUSP: 30.0000 mL | ORAL | Status: DC | PRN

## 2021-09-20 MED ORDER — INSULIN ASPART 100 UNIT/ML IJ SOLN: 0.0000 [IU] | INTRAMUSCULAR | Status: DC | PRN

## 2021-09-20 MED ORDER — ONDANSETRON HCL 4 MG/2ML IJ SOLN: 4.0000 mg | Freq: Four times a day (QID) | INTRAMUSCULAR | Status: DC | PRN

## 2021-09-20 MED ORDER — ACETAMINOPHEN 325 MG PO TABS: 325.0000 mg | ORAL_TABLET | Freq: Four times a day (QID) | ORAL | Status: DC | PRN

## 2021-09-20 MED ORDER — OXYCODONE HCL 5 MG PO TABS: 5.0000 mg | ORAL_TABLET | Freq: Once | ORAL | Status: DC | PRN

## 2021-09-20 MED ORDER — TRANEXAMIC ACID-NACL 1000-0.7 MG/100ML-% IV SOLN
INTRAVENOUS | Status: AC
  Filled 2021-09-20: qty 100

## 2021-09-20 MED ORDER — HYDROMORPHONE HCL 1 MG/ML IJ SOLN
0.5000 mg | INTRAMUSCULAR | Status: DC | PRN
  Administered 2021-09-20: 1 mg via INTRAVENOUS
  Filled 2021-09-20: qty 1

## 2021-09-20 MED ORDER — TRANEXAMIC ACID 1000 MG/10ML IV SOLN
INTRAVENOUS | Status: DC | PRN
  Administered 2021-09-20: 2000 mg via TOPICAL

## 2021-09-20 MED ORDER — MENTHOL 3 MG MT LOZG: 1.0000 | LOZENGE | OROMUCOSAL | Status: DC | PRN

## 2021-09-20 MED ORDER — DIPHENHYDRAMINE HCL 12.5 MG/5ML PO ELIX: 25.0000 mg | ORAL_SOLUTION | ORAL | Status: DC | PRN

## 2021-09-20 MED ORDER — VANCOMYCIN HCL 1 G IV SOLR
INTRAVENOUS | Status: DC | PRN
  Administered 2021-09-20: 1000 mg via TOPICAL

## 2021-09-20 MED ORDER — 0.9 % SODIUM CHLORIDE (POUR BTL) OPTIME
TOPICAL | Status: DC | PRN
Start: 2021-09-20 — End: 2021-09-20
  Administered 2021-09-20: 1000 mL

## 2021-09-20 MED ORDER — CHLORHEXIDINE GLUCONATE 0.12 % MT SOLN: 15.0000 mL | Freq: Once | OROMUCOSAL | Status: DC

## 2021-09-20 MED ORDER — MIDAZOLAM HCL 2 MG/2ML IJ SOLN
INTRAMUSCULAR | Status: AC
  Filled 2021-09-20: qty 2

## 2021-09-20 MED ORDER — METFORMIN HCL 500 MG PO TABS
500.0000 mg | ORAL_TABLET | Freq: Two times a day (BID) | ORAL | Status: DC
  Administered 2021-09-20 – 2021-09-21 (×2): 500 mg via ORAL
  Filled 2021-09-20 (×2): qty 1

## 2021-09-20 MED ORDER — BUPIVACAINE-MELOXICAM ER 400-12 MG/14ML IJ SOLN
INTRAMUSCULAR | Status: DC | PRN
  Administered 2021-09-20: 14 mL

## 2021-09-20 MED ORDER — EPHEDRINE SULFATE-NACL 50-0.9 MG/10ML-% IV SOSY
PREFILLED_SYRINGE | INTRAVENOUS | Status: DC | PRN
  Administered 2021-09-20 (×3): 5 mg via INTRAVENOUS

## 2021-09-20 MED ORDER — MIDAZOLAM HCL 2 MG/2ML IJ SOLN
INTRAMUSCULAR | Status: DC | PRN
  Administered 2021-09-20: 2 mg via INTRAVENOUS

## 2021-09-20 MED ORDER — OXYCODONE HCL 5 MG/5ML PO SOLN: 5.0000 mg | Freq: Once | ORAL | Status: DC | PRN

## 2021-09-20 MED ORDER — METOCLOPRAMIDE HCL 5 MG/ML IJ SOLN: 5.0000 mg | Freq: Three times a day (TID) | INTRAMUSCULAR | Status: DC | PRN

## 2021-09-20 MED ORDER — DEXAMETHASONE SODIUM PHOSPHATE 10 MG/ML IJ SOLN
10.0000 mg | Freq: Once | INTRAMUSCULAR | Status: AC
  Administered 2021-09-21: 10 mg via INTRAVENOUS
  Filled 2021-09-20: qty 1

## 2021-09-20 MED ORDER — CHLORHEXIDINE GLUCONATE 0.12 % MT SOLN
OROMUCOSAL | Status: AC
  Administered 2021-09-20: 15 mL
  Filled 2021-09-20: qty 15

## 2021-09-20 MED ORDER — ASPIRIN 81 MG PO CHEW
81.0000 mg | CHEWABLE_TABLET | Freq: Two times a day (BID) | ORAL | Status: DC
  Administered 2021-09-20 – 2021-09-21 (×2): 81 mg via ORAL
  Filled 2021-09-20 (×2): qty 1

## 2021-09-20 MED ORDER — PANTOPRAZOLE SODIUM 40 MG PO TBEC
40.0000 mg | DELAYED_RELEASE_TABLET | Freq: Every day | ORAL | Status: DC
  Administered 2021-09-20 – 2021-09-21 (×2): 40 mg via ORAL
  Filled 2021-09-20 (×2): qty 1

## 2021-09-20 MED ORDER — DOCUSATE SODIUM 100 MG PO CAPS
100.0000 mg | ORAL_CAPSULE | Freq: Two times a day (BID) | ORAL | Status: DC
  Administered 2021-09-20 – 2021-09-21 (×3): 100 mg via ORAL
  Filled 2021-09-20 (×3): qty 1

## 2021-09-20 MED ORDER — PHENYLEPHRINE HCL-NACL 20-0.9 MG/250ML-% IV SOLN
INTRAVENOUS | Status: DC | PRN
  Administered 2021-09-20: 30 ug/min via INTRAVENOUS

## 2021-09-20 MED ORDER — HYDROMORPHONE HCL 1 MG/ML IJ SOLN: 0.2500 mg | INTRAMUSCULAR | Status: DC | PRN

## 2021-09-20 MED ORDER — INSULIN ASPART 100 UNIT/ML IJ SOLN
0.0000 [IU] | Freq: Every day | INTRAMUSCULAR | Status: DC
  Administered 2021-09-20: 2 [IU] via SUBCUTANEOUS

## 2021-09-20 MED ORDER — METOCLOPRAMIDE HCL 5 MG PO TABS: 5.0000 mg | ORAL_TABLET | Freq: Three times a day (TID) | ORAL | Status: DC | PRN

## 2021-09-20 MED ORDER — SODIUM CHLORIDE 0.9 % IR SOLN
Status: DC | PRN
  Administered 2021-09-20: 1000 mL

## 2021-09-20 MED ORDER — OXYCODONE HCL 5 MG PO TABS: 10.0000 mg | ORAL_TABLET | ORAL | Status: DC | PRN

## 2021-09-20 MED ORDER — CEFAZOLIN IN SODIUM CHLORIDE 3-0.9 GM/100ML-% IV SOLN
3.0000 g | INTRAVENOUS | Status: AC
  Administered 2021-09-20: 3 g via INTRAVENOUS
  Filled 2021-09-20: qty 100

## 2021-09-20 MED ORDER — PROPOFOL 10 MG/ML IV BOLUS
INTRAVENOUS | Status: DC | PRN
  Administered 2021-09-20: 50 mg via INTRAVENOUS

## 2021-09-20 MED ORDER — POVIDONE-IODINE 10 % EX SWAB
2.0000 "application " | Freq: Once | CUTANEOUS | Status: AC
  Administered 2021-09-20: 2 via TOPICAL

## 2021-09-20 MED ORDER — TAMSULOSIN HCL 0.4 MG PO CAPS
0.4000 mg | ORAL_CAPSULE | Freq: Every day | ORAL | Status: DC
  Administered 2021-09-21: 0.4 mg via ORAL
  Filled 2021-09-20: qty 1

## 2021-09-20 MED ORDER — POLYETHYLENE GLYCOL 3350 17 G PO PACK
17.0000 g | PACK | Freq: Every day | ORAL | Status: DC
  Administered 2021-09-21: 17 g via ORAL
  Filled 2021-09-20: qty 1

## 2021-09-20 MED ORDER — PHENOL 1.4 % MT LIQD: 1.0000 | OROMUCOSAL | Status: DC | PRN

## 2021-09-20 MED ORDER — OXYCODONE HCL ER 10 MG PO T12A
10.0000 mg | EXTENDED_RELEASE_TABLET | Freq: Two times a day (BID) | ORAL | Status: DC
  Administered 2021-09-20 – 2021-09-21 (×3): 10 mg via ORAL
  Filled 2021-09-20 (×3): qty 1

## 2021-09-20 SURGICAL SUPPLY — 65 items
ADH SKN CLS APL DERMABOND .7 (GAUZE/BANDAGES/DRESSINGS) ×1
BAG COUNTER SPONGE SURGICOUNT (BAG) ×3 IMPLANT
BAG DECANTER FOR FLEXI CONT (MISCELLANEOUS) ×2 IMPLANT
BAG SPNG CNTER NS LX DISP (BAG) ×1
COVER PERINEAL POST (MISCELLANEOUS) ×2 IMPLANT
COVER SURGICAL LIGHT HANDLE (MISCELLANEOUS) ×3 IMPLANT
CUP ACET PNNCL SECTR W/GRIP 56 (Hips) IMPLANT
DERMABOND ADVANCED (GAUZE/BANDAGES/DRESSINGS) ×1
DERMABOND ADVANCED .7 DNX12 (GAUZE/BANDAGES/DRESSINGS) IMPLANT
DRAPE C-ARM 42X72 X-RAY (DRAPES) ×2 IMPLANT
DRAPE POUCH INSTRU U-SHP 10X18 (DRAPES) ×2 IMPLANT
DRAPE STERI IOBAN 125X83 (DRAPES) ×2 IMPLANT
DRAPE U-SHAPE 47X51 STRL (DRAPES) ×4 IMPLANT
DRSG AQUACEL AG ADV 3.5X10 (GAUZE/BANDAGES/DRESSINGS) ×2 IMPLANT
DURAPREP 26ML APPLICATOR (WOUND CARE) ×4 IMPLANT
ELECT BLADE 4.0 EZ CLEAN MEGAD (MISCELLANEOUS) ×2
ELECT REM PT RETURN 9FT ADLT (ELECTROSURGICAL) ×2
ELECTRODE BLDE 4.0 EZ CLN MEGD (MISCELLANEOUS) ×1 IMPLANT
ELECTRODE REM PT RTRN 9FT ADLT (ELECTROSURGICAL) ×1 IMPLANT
GLOVE BIOGEL PI IND STRL 7.0 (GLOVE) ×2 IMPLANT
GLOVE BIOGEL PI IND STRL 7.5 (GLOVE) ×4 IMPLANT
GLOVE BIOGEL PI INDICATOR 7.0 (GLOVE) ×1
GLOVE BIOGEL PI INDICATOR 7.5 (GLOVE) ×4
GLOVE ECLIPSE 7.0 STRL STRAW (GLOVE) ×4 IMPLANT
GLOVE SKINSENSE NS SZ7.5 (GLOVE) ×1
GLOVE SKINSENSE STRL SZ7.5 (GLOVE) ×1 IMPLANT
GLOVE SURG UNDER POLY LF SZ7 (GLOVE) ×2 IMPLANT
GLOVE SURG UNDER POLY LF SZ7.5 (GLOVE) ×4 IMPLANT
GOWN STRL REIN XL XLG (GOWN DISPOSABLE) ×2 IMPLANT
GOWN STRL REUS W/ TWL LRG LVL3 (GOWN DISPOSABLE) IMPLANT
GOWN STRL REUS W/ TWL XL LVL3 (GOWN DISPOSABLE) ×2 IMPLANT
GOWN STRL REUS W/TWL LRG LVL3 (GOWN DISPOSABLE)
GOWN STRL REUS W/TWL XL LVL3 (GOWN DISPOSABLE) ×2
HANDPIECE INTERPULSE COAX TIP (DISPOSABLE) ×2
HEAD CERAMIC 36 PLUS5 (Hips) ×1 IMPLANT
HOOD PEEL AWAY FLYTE STAYCOOL (MISCELLANEOUS) ×5 IMPLANT
IV NS IRRIG 3000ML ARTHROMATIC (IV SOLUTION) ×2 IMPLANT
KIT BASIN OR (CUSTOM PROCEDURE TRAY) ×3 IMPLANT
MARKER SKIN DUAL TIP RULER LAB (MISCELLANEOUS) ×2 IMPLANT
NDL SPNL 18GX3.5 QUINCKE PK (NEEDLE) ×1 IMPLANT
NEEDLE SPNL 18GX3.5 QUINCKE PK (NEEDLE) ×2 IMPLANT
PACK TOTAL JOINT (CUSTOM PROCEDURE TRAY) ×2 IMPLANT
PACK UNIVERSAL I (CUSTOM PROCEDURE TRAY) ×2 IMPLANT
PINN SECTOR W/GRIP ACE CUP 56 (Hips) ×2 IMPLANT
PINNACLE ALTRX PLUS 4 N 36X56 (Hips) ×1 IMPLANT
SAW OSC TIP CART 19.5X105X1.3 (SAW) ×2 IMPLANT
SCREW 6.5MMX30MM (Screw) ×1 IMPLANT
SET HNDPC FAN SPRY TIP SCT (DISPOSABLE) ×1 IMPLANT
SOLUTION PRONTOSAN WOUND 350ML (IRRIGATION / IRRIGATOR) ×1 IMPLANT
STAPLER VISISTAT 35W (STAPLE) IMPLANT
STEM FEMORAL SZ6 HIGH ACTIS (Stem) ×1 IMPLANT
SUT ETHIBOND 2 V 37 (SUTURE) ×2 IMPLANT
SUT ETHILON 2 0 PSLX (SUTURE) ×2 IMPLANT
SUT VIC AB 0 CT1 27 (SUTURE) ×2
SUT VIC AB 0 CT1 27XBRD ANBCTR (SUTURE) ×1 IMPLANT
SUT VIC AB 1 CTX 36 (SUTURE) ×2
SUT VIC AB 1 CTX36XBRD ANBCTR (SUTURE) ×1 IMPLANT
SUT VIC AB 2-0 CT1 27 (SUTURE) ×4
SUT VIC AB 2-0 CT1 TAPERPNT 27 (SUTURE) ×4 IMPLANT
SYR 50ML LL SCALE MARK (SYRINGE) ×3 IMPLANT
TOWEL GREEN STERILE (TOWEL DISPOSABLE) ×2 IMPLANT
TRAY CATH 16FR W/PLASTIC CATH (SET/KITS/TRAYS/PACK) IMPLANT
TRAY FOLEY W/BAG SLVR 16FR (SET/KITS/TRAYS/PACK) ×2
TRAY FOLEY W/BAG SLVR 16FR ST (SET/KITS/TRAYS/PACK) ×2 IMPLANT
YANKAUER SUCT BULB TIP NO VENT (SUCTIONS) ×2 IMPLANT

## 2021-09-20 NOTE — Discharge Instructions (Signed)

## 2021-09-20 NOTE — Op Note (Signed)
? ?LEFT TOTAL HIP ARTHROPLASTY ANTERIOR APPROACH  Procedure Note ?Gabriel Fernandez   [id number] ? ?Pre-op Diagnosis: left hip degenerative joint disease ?    ?Post-op Diagnosis: same ?  ?Operative Procedures  ?1. Total hip replacement; Left hip; uncemented [id number]  ? ?Surgeon: Frankey Shown, M.D. ? ?Assist: Madalyn Rob, PA-C ?  ?Anesthesia: spinal ? ?Prosthesis: Depuy ?Acetabulum: Pinnacle 56 mm ?Femur: Actis 6 HO ?Head: 36 mm size: +5 ?Liner: +4 ?Bearing Type: ceramic/poly ? ?Total Hip Arthroplasty (Anterior Approach) Op Note:  ?After informed consent was obtained and the operative extremity marked in the holding area, the patient was brought back to the operating room and placed supine on the HANA table. Next, the operative extremity was prepped and draped in normal sterile fashion. Surgical timeout occurred verifying patient identification, surgical site, surgical procedure and administration of antibiotics.  ?A modified anterior Smith-Peterson approach to the hip was performed, using the interval between tensor fascia lata and sartorius.  Dissection was carried bluntly down onto the anterior hip capsule. The lateral femoral circumflex vessels were identified and coagulated. A capsulotomy was performed and the capsular flaps tagged for later repair.  The neck osteotomy was performed. The femoral head was removed which showed severe degenerative wear, the acetabular rim was cleared of soft tissue and attention was turned to reaming the acetabulum.  ?Sequential reaming was performed under fluoroscopic guidance. We reamed to a size 55 mm, and then impacted the acetabular shell. A 30 mm cancellous screw was placed through the shell for added fixation.  The liner was then placed after irrigation and attention turned to the femur.  ?After placing the femoral hook, the leg was taken to externally rotated, extended and adducted position taking care to perform soft tissue releases to allow for adequate  mobilization of the femur. Soft tissue was cleared from the shoulder of the greater trochanter and the hook elevator used to improve exposure of the proximal femur. Sequential broaching performed up to a size 6. Trial neck and head were placed. The leg was brought back up to neutral and the construct reduced.  Antibiotic irrigation was placed in the surgical wound and kept for at least 1 minute.  The position and sizing of components, offset and leg lengths were checked using fluoroscopy. Stability of the construct was checked in extension and external rotation without any subluxation or impingement of prosthesis. We dislocated the prosthesis, dropped the leg back into position, removed trial components, and irrigated copiously. The final stem and head was then placed, the leg brought back up, the system reduced and fluoroscopy used to verify positioning.  ?We irrigated, obtained hemostasis and closed the capsule using #2 ethibond suture.  One gram of vancomycin powder was placed in the surgical bed.   One gram of topical tranexamic acid was injected into the joint.  The fascia was closed with #1 vicryl plus, the deep fat layer was closed with 0 vicryl, the subcutaneous layers closed with 2.0 Vicryl Plus and the skin closed with 2.0 nylon and dermabond. A sterile dressing was applied. The patient was awakened in the operating room and taken to recovery in stable condition.  ?All sponge, needle, and instrument counts were correct at the end of the case.  ? ?Tawanna Cooler, my PA, was a medical necessity for opening, closing, limb positioning, retracting, exposing, and overall facilitation and timely completion of the surgery. ? ?Position: supine  ?Complications: see description of procedure.  ?Time Out: performed  ? ?Drains/Packing: none ? ?Estimated  blood loss: see anesthesia record ? ?Returned to Recovery Room: in good condition.  ? ?Antibiotics: yes  ? ?Mechanical VTE (DVT) Prophylaxis: sequential compression  devices, TED thigh-high  ?Chemical VTE (DVT) Prophylaxis: aspirin  ? ?Fluid Replacement: see anesthesia record ? ?Specimens Removed: 1 to pathology  ? ?Sponge and Instrument Count Correct? yes  ? ?PACU: portable radiograph - low AP  ? ?Plan/RTC: Return in 2 weeks for staple removal. ?Weight Bearing/Load Lower Extremity: full  ?Hip precautions: none ?Suture Removal: 2 weeks  ? ?N. Eduard Roux, MD ?Marga Hoots ?1:03 PM ? ? ?Implant Name Type Inv. Item Serial No. Manufacturer Lot No. LRB No. Used Action  ?PINN SECTOR W/GRIP ACE CUP 56 - [id number] Hips PINN SECTOR W/GRIP ACE CUP 56  [employer] [id number] Left 1 Implanted  ?PINNACLE ALTRX PLUS 4 N [id number] - [id number] Hips PINNACLE ALTRX PLUS 4 N [id number]  [employer] [id number] Left 1 Implanted  ?SCREW 6.5MMX30MM - [id number] Screw SCREW 6.5MMX30MM  [employer] [id number] Left 1 Implanted  ?STEM FEMORAL SZ6 HIGH ACTIS - [id number] Stem STEM FEMORAL SZ6 HIGH ACTIS  [employer] [id number] Left 1 Implanted  ?HEAD CERAMIC 36 PLUS5 - [id number] Hips HEAD CERAMIC 36 PLUS5  [employer] [id number] Left 1 Implanted  ? ? ?

## 2021-09-20 NOTE — Transfer of Care (Signed)
Immediate Anesthesia Transfer of Care Note ? ?Patient: Gabriel Fernandez ? ?Procedure(s) Performed: LEFT TOTAL HIP ARTHROPLASTY ANTERIOR APPROACH (Left: Hip) ? ?Patient Location: PACU ? ?Anesthesia Type:MAC combined with regional for post-op pain ? ?Level of Consciousness: awake and alert  ? ?Airway & Oxygen Therapy: Patient Spontanous Breathing ? ?Post-op Assessment: Report given to RN and Post -op Vital signs reviewed and stable ? ?Post vital signs: Reviewed and stable ? ?Last Vitals:  ?Vitals Value Taken Time  ?BP 83/53 09/20/21 1338  ?Temp    ?Pulse 82 09/20/21 1338  ?Resp 17 09/20/21 1338  ?SpO2 95 % 09/20/21 1338  ?Vitals shown include unvalidated device data. ? ?Last Pain:  ?Vitals:  ? 09/20/21 1011  ?TempSrc:   ?PainSc: 5   ?   ? ?Patients Stated Pain Goal: 3 (09/20/21 1011) ? ?Complications: No notable events documented. ?

## 2021-09-20 NOTE — H&P (Signed)
H&P update ? ?The surgical history has been reviewed and remains accurate without interval change.  The patient was re-examined and patient's physiologic condition has not changed significantly in the last 30 days. The condition still exists that makes this procedure necessary. The treatment plan remains the same, without new options for care.  No new pharmacological allergies or types of therapy has been initiated that would change the plan or the appropriateness of the plan.  The patient and/or family understand the potential benefits and risks. ? ?09/20/2021 ?11:15 AM ? ?  ?

## 2021-09-20 NOTE — TOC Progression Note (Signed)
Transition of Care (TOC) - Progression Note  ? ? ?Patient Details  ?Name: Gabriel Fernandez ?MRN: [id number] ?Date of Birth: November 20, 1955 ? ?Transition of Care (TOC) CM/SW Contact  ?Marilu Favre, RN ?Phone Number: ?09/20/2021, 4:03 PM ? ?Clinical Narrative:    ? ?Dr Erlinda Hong 's office has arranged home health services through [facility].  ? ?3[facility] staff will provide any needed DME.  ? ?  ?  ? ?Expected Discharge Plan and Services ?  ?  ?  ?  ?  ?                ?  ?  ?  ?  ?  ?  ?  ?  ?  ?  ? ? ?Social Determinants of Health (SDOH) Interventions ?  ? ?Readmission Risk Interventions ?   ? View : No data to display.  ?  ?  ?  ? ? ?

## 2021-09-20 NOTE — Anesthesia Postprocedure Evaluation (Signed)
Anesthesia Post Note ? ?Patient: Gabriel Fernandez ? ?Procedure(s) Performed: LEFT TOTAL HIP ARTHROPLASTY ANTERIOR APPROACH (Left: Hip) ? ?  ? ?Patient location during evaluation: PACU ?Anesthesia Type: Spinal ?Level of consciousness: awake and alert ?Pain management: pain level controlled ?Vital Signs Assessment: post-procedure vital signs reviewed and stable ?Respiratory status: spontaneous breathing, nonlabored ventilation and respiratory function stable ?Cardiovascular status: blood pressure returned to baseline and stable ?Postop Assessment: no apparent nausea or vomiting ?Anesthetic complications: no ? ? ?No notable events documented. ? ?Last Vitals:  ?Vitals:  ? 09/20/21 1505 09/20/21 1538  ?BP: (!) 112/52 126/73  ?Pulse: (!) 51 62  ?Resp: 19 18  ?Temp:  36.8 ?C  ?SpO2: 98% 99%  ?  ?Last Pain:  ?Vitals:  ? 09/20/21 1435  ?TempSrc:   ?PainSc: 0-No pain  ? ? ?  ?  ?  ?  ?  ?  ? ?Lynda Rainwater ? ? ? ? ?

## 2021-09-20 NOTE — Anesthesia Preprocedure Evaluation (Signed)
Anesthesia Evaluation  ?Patient identified by MRN, date of birth, ID band ?Patient awake ? ? ? ?Reviewed: ?Allergy & Precautions, NPO status , Patient's Chart, lab work & pertinent test results ? ?Airway ?Mallampati: II ? ?TM Distance: >3 FB ?Neck ROM: Full ? ? ? Dental ?no notable dental hx. ? ?  ?Pulmonary ?sleep apnea , Current Smoker and Patient abstained from smoking.,  ?  ?Pulmonary exam normal ?breath sounds clear to auscultation ? ? ? ? ? ? Cardiovascular ?hypertension, Pt. on medications ?+ Peripheral Vascular Disease  ?Normal cardiovascular exam ?Rhythm:Regular Rate:Normal ? ? ?  ?Neuro/Psych ?negative neurological ROS ? negative psych ROS  ? GI/Hepatic ?Neg liver ROS, GERD  ,  ?Endo/Other  ?negative endocrine ROSdiabetes, Type 2, Oral Hypoglycemic Agents ? Renal/GU ?negative Renal ROS  ?negative genitourinary ?  ?Musculoskeletal ? ?(+) Arthritis , Osteoarthritis,   ? Abdominal ?(+) + obese,   ?Peds ?negative pediatric ROS ?(+)  Hematology ?negative hematology ROS ?(+)   ?Anesthesia Other Findings ? ? Reproductive/Obstetrics ?negative OB ROS ? ?  ? ? ? ? ? ? ? ? ? ? ? ? ? ?  ?  ? ? ? ? ? ? ? ? ?Anesthesia Physical ?Anesthesia Plan ? ?ASA: 3 ? ?Anesthesia Plan: Spinal  ? ?Post-op Pain Management: Minimal or no pain anticipated  ? ?Induction: Intravenous ? ?PONV Risk Score and Plan: 0 and Treatment may vary due to age or medical condition ? ?Airway Management Planned: Simple Face Mask ? ?Additional Equipment:  ? ?Intra-op Plan:  ? ?Post-operative Plan:  ? ?Informed Consent: I have reviewed the patients History and Physical, chart, labs and discussed the procedure including the risks, benefits and alternatives for the proposed anesthesia with the patient or authorized representative who has indicated his/her understanding and acceptance.  ? ? ? ?Dental advisory given ? ?Plan Discussed with: CRNA ? ?Anesthesia Plan Comments:   ? ? ? ? ? ? ?Anesthesia Quick Evaluation ? ?

## 2021-09-20 NOTE — H&P (Signed)
? ? ?PREOPERATIVE H&P ? ?Chief Complaint: left hip degenerative joint disease ? ?HPI: ?Gabriel Fernandez is a 66 y.o. male who presents for surgical treatment of left hip degenerative joint disease.  He denies any changes in medical history. ? ?Past Medical History:  ?Diagnosis Date  ? Allergy   ? seasonal allergies  ? Arthritis   ? 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 GERD (gastroesophageal reflux disease)   ? Gout   ? Hearing loss   ? dos not use hearing aids  ? Hyperlipidemia   ? Sleep apnea   ? Not on CPAP  ? ?Past Surgical History:  ?Procedure Laterality Date  ? COLONOSCOPY    ? sev colon's   ? POLYPECTOMY    ? ?Social History  ? ?Socioeconomic History  ? Marital status: Single  ?  Spouse name: Not on file  ? Number of children: Not on file  ? Years of education: Not on file  ? Highest education level: Not on file  ?Occupational History  ? Not on file  ?Tobacco Use  ? Smoking status: Some Days  ?  Packs/day: 1.00  ?  Years: 35.00  ?  Pack years: 35.00  ?  Types: Cigarettes  ? Smokeless tobacco: Never  ? Tobacco comments:  ?  1-2 cigs a day or more   ?Vaping Use  ? Vaping Use: Never used  ?Substance and Sexual Activity  ? Alcohol use: Yes  ?  Alcohol/week: 12.0 standard drinks  ?  Types: 6 Glasses of wine, 6 Cans of beer per week  ? Drug use: No  ? Sexual activity: Not Currently  ?Other Topics Concern  ? Not on file  ?Social History Narrative  ? Not on file  ? ?Social Determinants of Health  ? ?Financial Resource Strain: Not on file  ?Food Insecurity: Not on file  ?Transportation Needs: Not on file  ?Physical Activity: Not on file  ?Stress: Not on file  ?Social Connections: Not on file  ? ?Family History  ?Problem Relation Age of Onset  ? Cancer Mother   ? Ovarian cancer Mother   ? Stomach cancer Mother   ? Colon cancer Neg Hx   ? Esophageal cancer Neg Hx   ? Rectal cancer Neg Hx   ? Colon polyps Neg Hx   ? ?No Known Allergies ?Prior to Admission medications    ?Medication Sig Start Date End Date Taking? Authorizing Provider  ?atorvastatin (LIPITOR) 20 MG tablet Take 1 tablet (20 mg total) by mouth daily. 09/02/21 12/01/21 Yes Sagardia, Ines Bloomer, MD  ?lisinopril (ZESTRIL) 20 MG tablet Take 1 tablet (20 mg total) by mouth daily. 09/02/21  Yes Horald Pollen, MD  ?metFORMIN (GLUCOPHAGE) 500 MG tablet Take 1 tablet (500 mg total) by mouth 2 (two) times daily with a meal. 03/04/21  Yes Sagardia, Ines Bloomer, MD  ?tamsulosin (FLOMAX) 0.4 MG CAPS capsule Take 1 capsule (0.4 mg total) by mouth daily after breakfast. 09/02/21  Yes Sagardia, Ines Bloomer, MD  ?aspirin EC 81 MG tablet Take 1 tablet (81 mg total) by mouth 2 (two) times daily. To be taken after surgery to prevent blood 09/13/21   Aundra Dubin, PA-C  ?docusate sodium (COLACE) 100 MG capsule Take 1 capsule (100 mg total) by mouth daily as needed. 09/13/21 09/13/22  Aundra Dubin, PA-C  ?methocarbamol (ROBAXIN) 500 MG tablet Take 1 tablet (500 mg total) by mouth 2 (two) times daily as needed.  To be taken after surgery 09/13/21   Aundra Dubin, PA-C  ?ondansetron (ZOFRAN) 4 MG tablet Take 1 tablet (4 mg total) by mouth every 8 (eight) hours as needed for nausea or vomiting. 09/13/21   Aundra Dubin, PA-C  ?oxyCODONE-acetaminophen (PERCOCET) 5-325 MG tablet Take 1-2 tablets by mouth every 6 (six) hours as needed. To be taken after surgery 09/13/21   Aundra Dubin, PA-C  ? ? ? ?Positive ROS: All other systems have been reviewed and were otherwise negative with the exception of those mentioned in the HPI and as above. ? ?Physical Exam: ?General: Alert, no acute distress ?Cardiovascular: No pedal edema ?Respiratory: No cyanosis, no use of accessory musculature ?GI: abdomen soft ?Skin: No lesions in the area of chief complaint ?Neurologic: Sensation intact distally ?Psychiatric: Patient is competent for consent with normal mood and affect ?Lymphatic: no lymphedema ? ?MUSCULOSKELETAL: exam  stable ? ?Assessment: ?left hip degenerative joint disease ? ?Plan: ?Plan for Procedure(s): ?LEFT TOTAL HIP ARTHROPLASTY ANTERIOR APPROACH ? ?The risks benefits and alternatives were discussed with the patient including but not limited to the risks of nonoperative treatment, versus surgical intervention including infection, bleeding, nerve injury,  blood clots, cardiopulmonary complications, morbidity, mortality, among others, and they were willing to proceed.  ? ?Preoperative templating of the joint replacement has been completed, documented, and submitted to the Operating Room personnel in order to optimize intra-operative equipment management. ? ? ?Eduard Roux, MD ?09/20/2021 ?11:22 AM ? ?

## 2021-09-20 NOTE — Evaluation (Signed)
Physical Therapy Evaluation ?Patient Details ?Name: Gabriel Fernandez ?MRN: [id number] ?DOB: 1956-02-23 ?Today's Date: 09/20/2021 ? ?History of Present Illness ? Pt is 66 yo male s/p L anterior THA on 09/20/21.  Pt with hx including but not limited to DM, arthritis, GERD, sleep apnea, and gout.  ?Clinical Impression ? Pt is s/p THA resulting in the deficits listed below (see PT Problem List). At baseline, pt is independent.  He has support at home but does have 4-5 steps to enter house and his bedroom is upstairs (full flight).  Today, pt reports L hip aching and agreeable to therapy.  Once at EOB, pt reporting feet still with decreased sensation.  He was able to stand with min A of 2 for safety but not able to take any steps due to numbness (likely from spinal).  Pt expected to progress well as effects of spinal decrease.   Pt will benefit from skilled PT to increase their independence and safety with mobility to allow discharge to the venue listed below.  ?   ?   ? ?Recommendations for follow up therapy are one component of a multi-disciplinary discharge planning process, led by the attending physician.  Recommendations may be updated based on patient status, additional functional criteria and insurance authorization. ? ?Follow Up Recommendations Follow physician's recommendations for discharge plan and follow up therapies ? ?  ?Assistance Recommended at Discharge Frequent or constant Supervision/Assistance  ?Patient can return home with the following ? A little help with bathing/dressing/bathroom;Help with stairs or ramp for entrance;A lot of help with walking and/or transfers ? ?  ?Equipment Recommendations Rolling walker (2 wheels)  ?Recommendations for Other Services ?    ?  ?Functional Status Assessment Patient has had a recent decline in their functional status and demonstrates the ability to make significant improvements in function in a reasonable and predictable amount of time.  ? ?  ?Precautions / Restrictions  Precautions ?Precautions: Fall ?Restrictions ?Other Position/Activity Restrictions: WBAT  ? ?  ? ?Mobility ? Bed Mobility ?Overal bed mobility: Needs Assistance ?Bed Mobility: Supine to Sit, Sit to Supine ?  ?  ?Supine to sit: Min assist ?Sit to supine: Min assist ?  ?General bed mobility comments: Min A for L LE ?  ? ?Transfers ?Overall transfer level: Needs assistance ?Equipment used: Rolling walker (2 wheels) ?Transfers: Sit to/from Stand ?Sit to Stand: Min assist, +2 safety/equipment ?  ?  ?  ?  ?  ?General transfer comment: Cues for hand placement and L LE management; did have +2 for safety as pt still with decreased sensation in feet ?  ? ?Ambulation/Gait ?Ambulation/Gait assistance: Min assist, +2 safety/equipment ?Gait Distance (Feet): 1 Feet ?Assistive device: Rolling walker (2 wheels) ?Gait Pattern/deviations: Step-to pattern, Decreased stride length, Shuffle ?Gait velocity: decreased ?  ?  ?General Gait Details: Only 1-2 side steps to move closer to [facility] - limited by decreased sensation ? ?Stairs ?  ?  ?  ?  ?  ? ?Wheelchair Mobility ?  ? ?Modified Rankin (Stroke Patients Only) ?  ? ?  ? ?Balance Overall balance assessment: Needs assistance ?Sitting-balance support: No upper extremity supported ?Sitting balance-Leahy Scale: Good ?  ?  ?Standing balance support: Bilateral upper extremity supported ?Standing balance-Leahy Scale: Poor ?Standing balance comment: requiring RW and min A ?  ?  ?  ?  ?  ?  ?  ?  ?  ?  ?  ?   ? ? ? ?Pertinent Vitals/Pain Pain Assessment ?Pain Assessment: 0-10 ?  Pain Score: 5  ?Pain Location: L hip ?Pain Descriptors / Indicators: Aching ?Pain Intervention(s): Limited activity within patient's tolerance, Monitored during session, Ice applied, Patient requesting pain meds-RN notified  ? ? ?Home Living Family/patient expects to be discharged to:: Private residence ?Living Arrangements: Spouse/significant other ?Available Help at Discharge: Family;Available 24 hours/day ?Type of Home:  House ?Home Access: Stairs to enter ?Entrance Stairs-Rails: Right ?Entrance Stairs-Number of Steps: 5 ?Alternate Level Stairs-Number of Steps: flight - rails vary - some with bil others on one side ?Home Layout: Two level;1/2 bath on main level;Bed/bath upstairs ?Home Equipment: Toilet riser ?   ?  ?Prior Function Prior Level of Function : Independent/Modified Independent;Driving ?  ?  ?  ?  ?  ?  ?Mobility Comments: Could ambulate in community without AD ?ADLs Comments: independent ADLs and IADLs ?  ? ? ?Hand Dominance  ?   ? ?  ?Extremity/Trunk Assessment  ? Upper Extremity Assessment ?Upper Extremity Assessment: Overall WFL for tasks assessed ?  ? ?Lower Extremity Assessment ?Lower Extremity Assessment: LLE deficits/detail;RLE deficits/detail ?RLE Deficits / Details: ROM WFL; MMT 5/5 ?RLE Sensation: decreased light touch (Feet with decreased sensation from spinal, reports starting to feel.  Sensation in legs intact.) ?LLE Deficits / Details: ROM WFL; MMT 3/5 not further tested due to acuity of sx ?LLE Sensation: decreased light touch (Feet with decreased sensation from spinal, reports starting to feel.  Sensation in legs intact.) ?  ? ?Cervical / Trunk Assessment ?Cervical / Trunk Assessment: Normal  ?Communication  ? Communication: No difficulties  ?Cognition Arousal/Alertness: Awake/alert ?Behavior During Therapy: [facility] for tasks assessed/performed ?Overall Cognitive Status: Within Functional Limits for tasks assessed ?  ?  ?  ?  ?  ?  ?  ?  ?  ?  ?  ?  ?  ?  ?  ?  ?  ?  ?  ? ?  ?General Comments General comments (skin integrity, edema, etc.): Encouraged to perform ankle pumps to prevent blood clots.  Educated on PT role/POC, use of gait belt for L LE movement ? ?  ?Exercises    ? ?Assessment/Plan  ?  ?PT Assessment Patient needs continued PT services  ?PT Problem List Decreased strength;Decreased mobility;Decreased safety awareness;Decreased activity tolerance;Decreased balance;Decreased knowledge of use of  DME;Impaired sensation;Pain ? ?   ?  ?PT Treatment Interventions Therapeutic activities;DME instruction;Modalities;Gait training;Therapeutic exercise;Patient/family education;Stair training;Balance training;Functional mobility training   ? ?PT Goals (Current goals can be found in the Care Plan section)  ?Acute Rehab PT Goals ?Patient Stated Goal: return home ?PT Goal Formulation: With patient ?Time For Goal Achievement: 10/04/21 ?Potential to Achieve Goals: Good ? ?  ?Frequency 7X/week ?  ? ? ?Co-evaluation   ?  ?  ?  ?  ? ? ?  ?AM-PAC PT "6 Clicks" Mobility  ?Outcome Measure Help needed turning from your back to your side while in a flat bed without using bedrails?: A Little ?Help needed moving from lying on your back to sitting on the side of a flat bed without using bedrails?: A Little ?Help needed moving to and from a bed to a chair (including a wheelchair)?: A Little ?Help needed standing up from a chair using your arms (e.g., wheelchair or bedside chair)?: A Little ?Help needed to walk in hospital room?: Total ?Help needed climbing 3-5 steps with a railing? : Total ?6 Click Score: 14 ? ?  ?End of Session Equipment Utilized During Treatment: Gait belt ?Activity Tolerance: Patient tolerated treatment well ?Patient  left: in bed;with call bell/phone within reach;with bed alarm set ?Nurse Communication: Mobility status;Patient requests pain meds ?PT Visit Diagnosis: Other abnormalities of gait and mobility (R26.89);Muscle weakness (generalized) (M62.81) ?  ? ?Time: 5872-7618 ?PT Time Calculation (min) (ACUTE ONLY): 17 min ? ? ?Charges:   PT Evaluation ?$PT Eval Low Complexity: 1 Low ?  ?  ?   ? ? ?Abran Richard, PT ?Acute Rehab Services ?Pager [phone] ?[facility] [phone] ? ? ?Gabriel Fernandez ?09/20/2021, 4:44 PM ? ?

## 2021-09-20 NOTE — Anesthesia Procedure Notes (Signed)
Spinal ? ?Patient location during procedure: OR ?Start time: 09/20/2021 11:33 AM ?End time: 09/20/2021 11:38 AM ?Reason for block: surgical anesthesia ?Staffing ?Performed: anesthesiologist  ?Anesthesiologist: Lynda Rainwater, MD ?Preanesthetic Checklist ?Completed: patient identified, IV checked, site marked, risks and benefits discussed, surgical consent, monitors and equipment checked, pre-op evaluation and timeout performed ?Spinal Block ?Patient position: sitting ?Prep: DuraPrep ?Patient monitoring: heart rate, cardiac monitor, continuous pulse ox and blood pressure ?Approach: midline ?Location: L3-4 ?Injection technique: single-shot ?Needle ?Needle type: Quincke  ?Needle gauge: 22 G ?Needle length: 9 cm ?Assessment ?Sensory level: T4 ?Events: CSF return ? ? ? ?

## 2021-09-21 ENCOUNTER — Encounter (HOSPITAL_COMMUNITY): Payer: Self-pay | Admitting: Orthopaedic Surgery

## 2021-09-21 DIAGNOSIS — Z7984 Long term (current) use of oral hypoglycemic drugs: Secondary | ICD-10-CM | POA: Diagnosis not present

## 2021-09-21 DIAGNOSIS — E119 Type 2 diabetes mellitus without complications: Secondary | ICD-10-CM | POA: Diagnosis not present

## 2021-09-21 DIAGNOSIS — M1612 Unilateral primary osteoarthritis, left hip: Secondary | ICD-10-CM | POA: Diagnosis not present

## 2021-09-21 DIAGNOSIS — Z79899 Other long term (current) drug therapy: Secondary | ICD-10-CM | POA: Diagnosis not present

## 2021-09-21 DIAGNOSIS — F17210 Nicotine dependence, cigarettes, uncomplicated: Secondary | ICD-10-CM | POA: Diagnosis not present

## 2021-09-21 LAB — GLUCOSE, CAPILLARY
Glucose-Capillary: 137 mg/dL — ABNORMAL HIGH (ref 70–99)
Glucose-Capillary: 168 mg/dL — ABNORMAL HIGH (ref 70–99)
Glucose-Capillary: 130 mg/dL — ABNORMAL HIGH (ref 70–99)

## 2021-09-21 NOTE — Progress Notes (Signed)
Subjective: ?1 Day Post-Op Procedure(s) (LRB): ?LEFT TOTAL HIP ARTHROPLASTY ANTERIOR APPROACH (Left) ?Patient reports pain as mild.   ? ?Objective: ?Vital signs in last 24 hours: ?Temp:  [97.6 ?F (36.4 ?C)-98.4 ?F (36.9 ?C)] 97.9 ?F (36.6 ?C) (04/18 0744) ?Pulse Rate:  [50-77] 57 (04/18 0744) ?Resp:  [11-21] 18 (04/18 0744) ?BP: (83-143)/(43-95) 143/51 (04/18 0744) ?SpO2:  [91 %-99 %] 96 % (04/18 0744) ? ?Intake/Output from previous day: ?04/17 0701 - 04/18 0700 ?In: 1440 [P.O.:240; I.V.:1200] ?Out: 1610 [Urine:1825] ?Intake/Output this shift: ?No intake/output data recorded. ? ?No results for input(s): HGB in the last 72 hours. ?No results for input(s): WBC, RBC, HCT, PLT in the last 72 hours. ?No results for input(s): NA, K, CL, CO2, BUN, CREATININE, GLUCOSE, CALCIUM in the last 72 hours. ?No results for input(s): LABPT, INR in the last 72 hours. ? ?Neurologically intact ?Neurovascular intact ?Sensation intact distally ?Intact pulses distally ?Dorsiflexion/Plantar flexion intact ?Incision: dressing C/D/I ?No cellulitis present ?Compartment soft ? ? ?Assessment/Plan: ?1 Day Post-Op Procedure(s) (LRB): ?LEFT TOTAL HIP ARTHROPLASTY ANTERIOR APPROACH (Left) ?Advance diet ?Up with therapy ?D/C IV fluids ?WBAT LLE ?Awaiting labs ?D/c home after second PT session ? ? ? ? ? ?Gabriel Fernandez ?09/21/2021, 8:13 AM ? ?

## 2021-09-21 NOTE — Discharge Summary (Signed)
Patient ID: ?Gabriel Fernandez ?MRN: [id number] ?DOB/AGE: 1955-10-23 66 y.o. ? ?Admit date: 09/20/2021 ?Discharge date: 09/21/2021 ? ?Admission Diagnoses:  ?Principal Problem: ?  Degenerative joint disease of right hip ?Active Problems: ?  Unilateral primary osteoarthritis, left hip ?  Status post total replacement of left hip ? ? ?Discharge Diagnoses:  ?Same ? ?Past Medical History:  ?Diagnosis Date  ? Allergy   ? seasonal allergies  ? Arthritis   ? Diabetes mellitus without complication ([place])   ? type 2, non-insulin dependent  ? GERD (gastroesophageal reflux disease)   ? Gout   ? Hearing loss   ? dos not use hearing aids  ? Hyperlipidemia   ? Sleep apnea   ? Not on CPAP  ? ? ?Surgeries: Procedure(s): ?LEFT TOTAL HIP ARTHROPLASTY ANTERIOR APPROACH on 09/20/2021 ?  ?Consultants:  ? ?Discharged Condition: Improved ? ?Hospital Course: Gabriel Fernandez is an 66 y.o. male who was admitted 09/20/2021 for operative treatment ofDegenerative joint disease of right hip. Patient has severe unremitting pain that affects sleep, daily activities, and work/hobbies. After pre-op clearance the patient was taken to the operating room on 09/20/2021 and underwent  Procedure(s): ?LEFT TOTAL HIP ARTHROPLASTY ANTERIOR APPROACH.   ? ?Patient was given perioperative antibiotics:  ?Anti-infectives (From admission, onward)  ? ? Start     Dose/Rate Route Frequency Ordered Stop  ? 09/20/21 1800  ceFAZolin (ANCEF) IVPB 2g/100 mL premix       ? 2 g ?200 mL/hr over 30 Minutes Intravenous Every 6 hours 09/20/21 1519 09/21/21 0007  ? 09/20/21 1306  vancomycin (VANCOCIN) powder  Status:  Discontinued       ?   As needed 09/20/21 1306 09/20/21 1333  ? 09/20/21 1100  ceFAZolin (ANCEF) IVPB 3g/100 mL premix       ? 3 g ?200 mL/hr over 30 Minutes Intravenous On call to O.R. 09/20/21 0951 09/20/21 1141  ? ?  ?  ? ?Patient was given sequential compression devices, early ambulation, and chemoprophylaxis to prevent DVT. ? ?Patient benefited maximally from hospital  stay and there were no complications.   ? ?Recent vital signs: Patient Vitals for the past 24 hrs: ? BP Temp Temp src Pulse Resp SpO2  ?09/21/21 0744 (!) 143/51 97.9 ?F (36.6 ?C) Oral (!) 57 18 96 %  ?09/21/21 0347 (!) 133/58 97.9 ?F (36.6 ?C) Oral 70 20 92 %  ?09/20/21 2313 137/76 98.4 ?F (36.9 ?C) Oral 66 18 98 %  ?09/20/21 1919 139/68 97.8 ?F (36.6 ?C) Oral (!) 56 20 98 %  ?09/20/21 1538 126/73 98.2 ?F (36.8 ?C) -- 62 18 99 %  ?09/20/21 1505 (!) 112/52 -- -- (!) 51 19 98 %  ?09/20/21 1450 (!) 103/56 -- -- (!) 50 11 98 %  ?09/20/21 1435 (!) 108/95 97.6 ?F (36.4 ?C) -- (!) 57 12 96 %  ?09/20/21 1420 (!) 103/43 -- -- 63 17 95 %  ?09/20/21 1405 (!) 93/45 -- -- (!) 51 (!) 21 96 %  ?09/20/21 1350 (!) 94/50 -- -- 60 20 91 %  ?09/20/21 1335 (!) 83/53 97.6 ?F (36.4 ?C) -- 77 (!) 21 94 %  ?09/20/21 0959 130/63 97.7 ?F (36.5 ?C) Oral 61 18 95 %  ?  ? ?Recent laboratory studies: No results for input(s): WBC, HGB, HCT, PLT, NA, K, CL, CO2, BUN, CREATININE, GLUCOSE, INR, CALCIUM in the last 72 hours. ? ?Invalid input(s): PT, 2 ? ? ?Discharge Medications:   ?Allergies as of 09/21/2021   ?No Known Allergies ?  ? ?  ?  Medication List  ?  ? ?TAKE these medications   ? ?aspirin EC 81 MG tablet ?Take 1 tablet (81 mg total) by mouth 2 (two) times daily. To be taken after surgery to prevent blood ?  ?atorvastatin 20 MG tablet ?Commonly known as: LIPITOR ?Take 1 tablet (20 mg total) by mouth daily. ?  ?docusate sodium 100 MG capsule ?Commonly known as: Colace ?Take 1 capsule (100 mg total) by mouth daily as needed. ?  ?lisinopril 20 MG tablet ?Commonly known as: ZESTRIL ?Take 1 tablet (20 mg total) by mouth daily. ?  ?metFORMIN 500 MG tablet ?Commonly known as: GLUCOPHAGE ?Take 1 tablet (500 mg total) by mouth 2 (two) times daily with a meal. ?  ?methocarbamol 500 MG tablet ?Commonly known as: Robaxin ?Take 1 tablet (500 mg total) by mouth 2 (two) times daily as needed. To be taken after surgery ?  ?ondansetron 4 MG tablet ?Commonly  known as: Zofran ?Take 1 tablet (4 mg total) by mouth every 8 (eight) hours as needed for nausea or vomiting. ?  ?oxyCODONE-acetaminophen 5-325 MG tablet ?Commonly known as: Percocet ?Take 1-2 tablets by mouth every 6 (six) hours as needed. To be taken after surgery ?  ?tamsulosin 0.4 MG Caps capsule ?Commonly known as: FLOMAX ?Take 1 capsule (0.4 mg total) by mouth daily after breakfast. ?  ? ?  ? ?  ?  ? ? ?  ?Durable Medical Equipment  ?(From admission, onward)  ?  ? ? ?  ? ?  Start     Ordered  ? 09/20/21 1520  DME Walker rolling  Once       ?Question:  Patient needs a walker to treat with the following condition  Answer:  History of hip replacement  ? 09/20/21 1519  ? 09/20/21 1520  DME 3 n 1  Once       ? 09/20/21 1519  ? 09/20/21 1520  DME Bedside commode  Once       ?Question:  Patient needs a bedside commode to treat with the following condition  Answer:  History of hip replacement  ? 09/20/21 1519  ? ?  ?  ? ?  ? ? ?Diagnostic Studies: DG Pelvis Portable ? ?Result Date: 09/20/2021 ?CLINICAL DATA:  Status post left total hip arthroplasty EXAM: PORTABLE PELVIS 1-2 VIEWS COMPARISON:  03/26/2021 pelvic radiograph FINDINGS: Status post left total hip arthroplasty with well-positioned left acetabular and left proximal femoral prostheses with no evidence of hardware fracture or loosening. No evidence of hip dislocation on this single frontal view. No bone fractures. No focal osseous lesions. Expected soft tissue gas surrounding the left hip joint. IMPRESSION: Satisfactory immediate postoperative appearance status post left total hip arthroplasty. Electronically Signed   By: Ilona Sorrel M.D.   On: 09/20/2021 15:35  ? ?DG C-Arm 1-60 Min-No Report ? ?Result Date: 09/20/2021 ?Fluoroscopy was utilized by the requesting physician.  No radiographic interpretation.  ? ?DG C-Arm 1-60 Min-No Report ? ?Result Date: 09/20/2021 ?Fluoroscopy was utilized by the requesting physician.  No radiographic interpretation.  ? ?DG HIP  UNILAT WITH PELVIS 1V LEFT ? ?Result Date: 09/20/2021 ?CLINICAL DATA:  Left hip arthroplasty. EXAM: DG HIP (WITH OR WITHOUT PELVIS) 1V*L* COMPARISON:  Pelvis x-rays dated March 26, 2021. FLUOROSCOPY TIME:  Radiation Exposure Index (as provided by the fluoroscopic device): 11.22 mGy Kerma C-arm fluoroscopic images were obtained intraoperatively and submitted for post operative interpretation. FINDINGS: Multiple intraoperative fluoroscopic images demonstrate interval left total hip arthroplasty. Components are well aligned.  No acute osseous abnormality. IMPRESSION: 1. Intraoperative fluoroscopic guidance for left total hip arthroplasty. Electronically Signed   By: Titus Dubin M.D.   On: 09/20/2021 13:30   ? ?Disposition: Discharge disposition: 01-Home or Self Care ? ? ? ? ? ? ? ? ? Follow-up Information   ? ? Leandrew Koyanagi, MD. Schedule an appointment as soon as possible for a visit in 2 week(s).   ?Specialty: Orthopedic Surgery ?Contact information: ?[address] ?[place] [address] ?[phone] ? ? ?  ?  ? ?  ?  ? ?  ? ? ? ?Signed: ?Aundra Dubin ?09/21/2021, 8:14 AM ? ? ? ? ?

## 2021-09-21 NOTE — Plan of Care (Signed)
?  Problem: Education: ?Goal: Knowledge of the prescribed therapeutic regimen will improve ?Outcome: Adequate for Discharge ?Goal: Understanding of discharge needs will improve ?Outcome: Adequate for Discharge ?Goal: Individualized Educational Video(s) ?Outcome: Adequate for Discharge ?  ?Problem: Safety: ?Goal: Ability to remain free from injury will improve ?Outcome: Adequate for Discharge ?  ?Problem: Activity: ?Goal: Ability to avoid complications of mobility impairment will improve ?Outcome: Adequate for Discharge ?Goal: Ability to tolerate increased activity will improve ?Outcome: Adequate for Discharge ?  ?Problem: Clinical Measurements: ?Goal: Postoperative complications will be avoided or minimized ?Outcome: Adequate for Discharge ?  ?Problem: Pain Management: ?Goal: Pain level will decrease with appropriate interventions ?Outcome: Adequate for Discharge ?  ?Problem: Skin Integrity: ?Goal: Will show signs of wound healing ?Outcome: Adequate for Discharge ?  ?

## 2021-09-21 NOTE — Progress Notes (Signed)
Patient alert and oriented, mae's well, voiding adequate amount of urine, swallowing without difficulty, no c/o pain at time of discharge. Patient discharged home with family. Script and discharged instructions given to patient. Patient and family stated understanding of instructions given. Patient has an appointment with Dr. Xu in 2 weeks 

## 2021-09-21 NOTE — Progress Notes (Signed)
Physical Therapy Treatment ?Patient Details ?Name: Gabriel Fernandez ?MRN: [id number] ?DOB: [birth year] ?Today's Date: 09/21/2021 ? ? ?History of Present Illness Pt is 66 yo male s/p L anterior THA on 09/20/21.  Pt with hx including but not limited to DM, arthritis, GERD, sleep apnea, and gout. ? ?  ?PT Comments  ? ? Pt made good progress towards his physical therapy goals during his inpatient stay. Focus on stair training and standing HEP prior to d/c home. Pt negotiated 8 steps with unilateral vs bilateral railings to simulate home set up. Education provided regarding repetition and frequency of exercise program. Will benefit from follow up PT to address strengthening, balance and functional mobility.  ?   ?Recommendations for follow up therapy are one component of a multi-disciplinary discharge planning process, led by the attending physician.  Recommendations may be updated based on patient status, additional functional criteria and insurance authorization. ? ?Follow Up Recommendations ? Follow physician's recommendations for discharge plan and follow up therapies ?  ?  ?Assistance Recommended at Discharge PRN  ?Patient can return home with the following Assistance with cooking/housework;Assist for transportation ?  ?Equipment Recommendations ? Rolling walker (2 wheels)  ?  ?Recommendations for Other Services   ? ? ?  ?Precautions / Restrictions Precautions ?Precautions: Fall ?Restrictions ?Weight Bearing Restrictions: Yes ?LLE Weight Bearing: Weight bearing as tolerated  ?  ? ?Mobility ? Bed Mobility ?Overal bed mobility: Modified Independent ?  ?  ?  ?  ?  ?  ?General bed mobility comments: Increased time/effort, no physical assist required ?  ? ?Transfers ?Overall transfer level: Modified independent ?Equipment used: Rolling walker (2 wheels) ?  ?  ?  ?  ?  ?  ?  ?General transfer comment: Increased effort from lower bed surface ?  ? ?Ambulation/Gait ?Ambulation/Gait assistance: Modified independent (Device/Increase  time) ?Gait Distance (Feet): 50 Feet ?Assistive device: Rolling walker (2 wheels) ?Gait Pattern/deviations: Step-through pattern, Decreased stance time - left, Trunk flexed ?Gait velocity: decreased ?  ?  ?General Gait Details: Good step through pattern and steady pace, cues for upright posture and glute activation ? ? ?Stairs ?Stairs: Yes ?Stairs assistance: Min guard ?Stair Management: One rail Right, Two rails ?Number of Stairs: 8 ?General stair comments: Cues for sequencing/technique. Practiced with R railing vs bilateral rails to simulate home environment ? ? ?Wheelchair Mobility ?  ? ?Modified Rankin (Stroke Patients Only) ?  ? ? ?  ?Balance Overall balance assessment: Needs assistance ?Sitting-balance support: No upper extremity supported ?Sitting balance-Leahy Scale: Good ?  ?  ?Standing balance support: Bilateral upper extremity supported ?Standing balance-Leahy Scale: Poor ?  ?  ?  ?  ?  ?  ?  ?  ?  ?  ?  ?  ?  ? ?  ?Cognition Arousal/Alertness: Awake/alert ?Behavior During Therapy: [facility] for tasks assessed/performed ?Overall Cognitive Status: Within Functional Limits for tasks assessed ?  ?  ?  ?  ?  ?  ?  ?  ?  ?  ?  ?  ?  ?  ?  ?  ?  ?  ?  ? ?  ?Exercises Total Joint Exercises ?Quad Sets: Both, 10 reps, Supine ?Heel Slides: Left, 5 reps, Supine ?Hip ABduction/ADduction: Left, 5 reps, Standing ?Long Arc Quad: Left, 10 reps, Seated ?Knee Flexion: Left, 10 reps, Standing ?Marching in Standing: Left, 5 reps, Standing ?Standing Hip Extension: Left, 5 reps, Standing ? ?  ?General Comments   ?  ?  ? ?Pertinent Vitals/Pain  Pain Assessment ?Pain Assessment: Faces ?Faces Pain Scale: Hurts little more ?Pain Location: L hip ?Pain Descriptors / Indicators: Aching, Operative site guarding ?Pain Intervention(s): Monitored during session  ? ? ?Home Living   ?  ?  ?  ?  ?  ?  ?  ?  ?  ?   ?  ?Prior Function    ?  ?  ?   ? ?PT Goals (current goals can now be found in the care plan section) Acute Rehab PT Goals ?Patient  Stated Goal: return home ?Potential to Achieve Goals: Good ? ?  ?Frequency ? ? ? 7X/week ? ? ? ?  ?PT Plan Current plan remains appropriate  ? ? ?Co-evaluation   ?  ?  ?  ?  ? ?  ?AM-PAC PT "6 Clicks" Mobility   ?Outcome Measure ? Help needed turning from your back to your side while in a flat bed without using bedrails?: None ?Help needed moving from lying on your back to sitting on the side of a flat bed without using bedrails?: None ?Help needed moving to and from a bed to a chair (including a wheelchair)?: None ?Help needed standing up from a chair using your arms (e.g., wheelchair or bedside chair)?: None ?Help needed to walk in hospital room?: None ?Help needed climbing 3-5 steps with a railing? : A Little ?6 Click Score: 23 ? ?  ?End of Session   ?Activity Tolerance: Patient tolerated treatment well ?Patient left: in bed;with call bell/phone within reach ?Nurse Communication: Mobility status ?PT Visit Diagnosis: Other abnormalities of gait and mobility (R26.89);Muscle weakness (generalized) (M62.81) ?  ? ? ?Time: 6073-7106 ?PT Time Calculation (min) (ACUTE ONLY): 16 min ? ?Charges:  $Gait Training: 8-22 mins          ?          ? ?Wyona Almas, PT, DPT ?Acute Rehabilitation Services ?Pager [phone] ?Office [phone] ? ? ? ?[name] ?09/21/2021, 4:36 PM ? ?

## 2021-09-21 NOTE — Progress Notes (Signed)
Physical Therapy Treatment ?Patient Details ?Name: Gabriel Fernandez ?MRN: [id number] ?DOB: 11-Jun-1955 ?Today's Date: 09/21/2021 ? ? ?History of Present Illness Pt is 66 yo male s/p L anterior THA on 09/20/21.  Pt with hx including but not limited to DM, arthritis, GERD, sleep apnea, and gout. ? ?  ?PT Comments  ? ? Pt making excellent progress towards his physical therapy goals. Session focused on reviewing bed level exercises for left hip strengthening/ROM (written instruction provided), gait and stair training. Pt ambulating 400 ft with a RW, utilizing a step through pattern. After practicing one step, pt reporting dizziness/nausea; BP stable and RN notified. Will continue stair training next session. ?  ?Recommendations for follow up therapy are one component of a multi-disciplinary discharge planning process, led by the attending physician.  Recommendations may be updated based on patient status, additional functional criteria and insurance authorization. ? ?Follow Up Recommendations ? Follow physician's recommendations for discharge plan and follow up therapies ?  ?  ?Assistance Recommended at Discharge PRN  ?Patient can return home with the following Assistance with cooking/housework;Assist for transportation ?  ?Equipment Recommendations ? Rolling walker (2 wheels)  ?  ?Recommendations for Other Services   ? ? ?  ?Precautions / Restrictions Precautions ?Precautions: Fall ?Restrictions ?Weight Bearing Restrictions: Yes ?LLE Weight Bearing: Weight bearing as tolerated  ?  ? ?Mobility ? Bed Mobility ?Overal bed mobility: Modified Independent ?  ?  ?  ?  ?  ?  ?General bed mobility comments: Increased time/effort, no physical assist required ?  ? ?Transfers ?Overall transfer level: Modified independent ?Equipment used: Rolling walker (2 wheels) ?  ?  ?  ?  ?  ?  ?  ?General transfer comment: Increased effort from lower bed surface ?  ? ?Ambulation/Gait ?Ambulation/Gait assistance: Supervision ?Gait Distance (Feet):  400 Feet ?Assistive device: Rolling walker (2 wheels) ?Gait Pattern/deviations: Step-through pattern, Decreased stance time - left, Trunk flexed ?Gait velocity: decreased ?  ?  ?General Gait Details: Good step through pattern and steady pace, cues for upright posture and glute activation ? ? ?Stairs ?Stairs: Yes ?Stairs assistance: Min guard ?Stair Management: One rail Right ?Number of Stairs: 1 ?General stair comments: cues for sequencing/technique ? ? ?Wheelchair Mobility ?  ? ?Modified Rankin (Stroke Patients Only) ?  ? ? ?  ?Balance Overall balance assessment: Needs assistance ?Sitting-balance support: No upper extremity supported ?Sitting balance-Leahy Scale: Good ?  ?  ?Standing balance support: Bilateral upper extremity supported ?Standing balance-Leahy Scale: Poor ?  ?  ?  ?  ?  ?  ?  ?  ?  ?  ?  ?  ?  ? ?  ?Cognition Arousal/Alertness: Awake/alert ?Behavior During Therapy: [facility] for tasks assessed/performed ?Overall Cognitive Status: Within Functional Limits for tasks assessed ?  ?  ?  ?  ?  ?  ?  ?  ?  ?  ?  ?  ?  ?  ?  ?  ?  ?  ?  ? ?  ?Exercises Total Joint Exercises ?Quad Sets: Both, 10 reps, Supine ?Heel Slides: Left, 5 reps, Supine ?Hip ABduction/ADduction: Left, 5 reps, Supine ?Long Arc Quad: Left, 10 reps, Seated ? ?  ?General Comments   ?  ?  ? ?Pertinent Vitals/Pain Pain Assessment ?Pain Assessment: Faces ?Faces Pain Scale: Hurts little more ?Pain Location: L hip ?Pain Descriptors / Indicators: Aching, Operative site guarding ?Pain Intervention(s): Monitored during session  ? ? ?Home Living   ?  ?  ?  ?  ?  ?  ?  ?  ?  ?   ?  ?  Prior Function    ?  ?  ?   ? ?PT Goals (current goals can now be found in the care plan section) Acute Rehab PT Goals ?Patient Stated Goal: return home ?Potential to Achieve Goals: Good ?Progress towards PT goals: Progressing toward goals ? ?  ?Frequency ? ? ? 7X/week ? ? ? ?  ?PT Plan Current plan remains appropriate  ? ? ?Co-evaluation   ?  ?  ?  ?  ? ?  ?AM-PAC PT "6  Clicks" Mobility   ?Outcome Measure ? Help needed turning from your back to your side while in a flat bed without using bedrails?: None ?Help needed moving from lying on your back to sitting on the side of a flat bed without using bedrails?: None ?Help needed moving to and from a bed to a chair (including a wheelchair)?: None ?Help needed standing up from a chair using your arms (e.g., wheelchair or bedside chair)?: None ?Help needed to walk in hospital room?: A Little ?Help needed climbing 3-5 steps with a railing? : A Little ?6 Click Score: 22 ? ?  ?End of Session   ?Activity Tolerance: Patient tolerated treatment well ?Patient left: in bed;with call bell/phone within reach ?Nurse Communication: Mobility status ?PT Visit Diagnosis: Other abnormalities of gait and mobility (R26.89);Muscle weakness (generalized) (M62.81) ?  ? ? ?Time: 8453-6468 ?PT Time Calculation (min) (ACUTE ONLY): 27 min ? ?Charges:  $Gait Training: 8-22 mins ?$Therapeutic Activity: 8-22 mins          ?          ? ?Gabriel Fernandez, PT, DPT ?Acute Rehabilitation Services ?Pager [phone] ?Office [phone] ? ? ? ?Gabriel Fernandez ?09/21/2021, 9:57 AM ? ?

## 2021-09-22 ENCOUNTER — Telehealth: Payer: Self-pay | Admitting: *Deleted

## 2021-09-22 ENCOUNTER — Telehealth: Payer: Self-pay | Admitting: Orthopaedic Surgery

## 2021-09-22 DIAGNOSIS — Z471 Aftercare following joint replacement surgery: Secondary | ICD-10-CM | POA: Diagnosis not present

## 2021-09-22 DIAGNOSIS — E1169 Type 2 diabetes mellitus with other specified complication: Secondary | ICD-10-CM | POA: Diagnosis not present

## 2021-09-22 DIAGNOSIS — H9190 Unspecified hearing loss, unspecified ear: Secondary | ICD-10-CM | POA: Diagnosis not present

## 2021-09-22 DIAGNOSIS — E785 Hyperlipidemia, unspecified: Secondary | ICD-10-CM | POA: Diagnosis not present

## 2021-09-22 DIAGNOSIS — Z7982 Long term (current) use of aspirin: Secondary | ICD-10-CM | POA: Diagnosis not present

## 2021-09-22 DIAGNOSIS — I872 Venous insufficiency (chronic) (peripheral): Secondary | ICD-10-CM | POA: Diagnosis not present

## 2021-09-22 DIAGNOSIS — E1151 Type 2 diabetes mellitus with diabetic peripheral angiopathy without gangrene: Secondary | ICD-10-CM | POA: Diagnosis not present

## 2021-09-22 DIAGNOSIS — G4730 Sleep apnea, unspecified: Secondary | ICD-10-CM | POA: Diagnosis not present

## 2021-09-22 DIAGNOSIS — E1159 Type 2 diabetes mellitus with other circulatory complications: Secondary | ICD-10-CM | POA: Diagnosis not present

## 2021-09-22 DIAGNOSIS — K219 Gastro-esophageal reflux disease without esophagitis: Secondary | ICD-10-CM | POA: Diagnosis not present

## 2021-09-22 DIAGNOSIS — Z7984 Long term (current) use of oral hypoglycemic drugs: Secondary | ICD-10-CM | POA: Diagnosis not present

## 2021-09-22 DIAGNOSIS — I159 Secondary hypertension, unspecified: Secondary | ICD-10-CM | POA: Diagnosis not present

## 2021-09-22 DIAGNOSIS — M109 Gout, unspecified: Secondary | ICD-10-CM | POA: Diagnosis not present

## 2021-09-22 DIAGNOSIS — Z96642 Presence of left artificial hip joint: Secondary | ICD-10-CM | POA: Diagnosis not present

## 2021-09-22 DIAGNOSIS — Z87891 Personal history of nicotine dependence: Secondary | ICD-10-CM | POA: Diagnosis not present

## 2021-09-22 DIAGNOSIS — J302 Other seasonal allergic rhinitis: Secondary | ICD-10-CM | POA: Diagnosis not present

## 2021-09-22 NOTE — Telephone Encounter (Signed)
Called patient to schedule appt . He is currently recovering from hip surgery. Will call office in a few weeks to schedule this appt  ?

## 2021-09-22 NOTE — Telephone Encounter (Signed)
Thank you :)

## 2021-09-22 NOTE — Telephone Encounter (Signed)
Called pt to sch post op follow up with Xu. Pt had surg on 09/20/21.  ?

## 2021-09-22 NOTE — Telephone Encounter (Signed)
Gabriel Fernandez at Dr. Geralynn Ochs office to clarify fax correspondence that we have received for patient. Dr. Geralynn Ochs is out of town and Juliann Pulse will contact me with provider response to this fax correspondence.  ?

## 2021-09-22 NOTE — Telephone Encounter (Signed)
Juliann Pulse from Dr. Geralynn Ochs called and states patient needs surgical clearance for periodontal surgery. Dr. Geralynn Ochs is needing clearance for treatment/ surgery as well as if patient will need ABX prior to treatment. Patient will be called and scheduled to come in office for Surgical clearance.  ?

## 2021-09-25 DIAGNOSIS — Z7984 Long term (current) use of oral hypoglycemic drugs: Secondary | ICD-10-CM | POA: Diagnosis not present

## 2021-09-25 DIAGNOSIS — J302 Other seasonal allergic rhinitis: Secondary | ICD-10-CM | POA: Diagnosis not present

## 2021-09-25 DIAGNOSIS — E785 Hyperlipidemia, unspecified: Secondary | ICD-10-CM | POA: Diagnosis not present

## 2021-09-25 DIAGNOSIS — Z96642 Presence of left artificial hip joint: Secondary | ICD-10-CM | POA: Diagnosis not present

## 2021-09-25 DIAGNOSIS — G4730 Sleep apnea, unspecified: Secondary | ICD-10-CM | POA: Diagnosis not present

## 2021-09-25 DIAGNOSIS — Z87891 Personal history of nicotine dependence: Secondary | ICD-10-CM | POA: Diagnosis not present

## 2021-09-25 DIAGNOSIS — I159 Secondary hypertension, unspecified: Secondary | ICD-10-CM | POA: Diagnosis not present

## 2021-09-25 DIAGNOSIS — E1151 Type 2 diabetes mellitus with diabetic peripheral angiopathy without gangrene: Secondary | ICD-10-CM | POA: Diagnosis not present

## 2021-09-25 DIAGNOSIS — Z7982 Long term (current) use of aspirin: Secondary | ICD-10-CM | POA: Diagnosis not present

## 2021-09-25 DIAGNOSIS — E1169 Type 2 diabetes mellitus with other specified complication: Secondary | ICD-10-CM | POA: Diagnosis not present

## 2021-09-25 DIAGNOSIS — E1159 Type 2 diabetes mellitus with other circulatory complications: Secondary | ICD-10-CM | POA: Diagnosis not present

## 2021-09-25 DIAGNOSIS — H9190 Unspecified hearing loss, unspecified ear: Secondary | ICD-10-CM | POA: Diagnosis not present

## 2021-09-25 DIAGNOSIS — M109 Gout, unspecified: Secondary | ICD-10-CM | POA: Diagnosis not present

## 2021-09-25 DIAGNOSIS — Z471 Aftercare following joint replacement surgery: Secondary | ICD-10-CM | POA: Diagnosis not present

## 2021-09-25 DIAGNOSIS — I872 Venous insufficiency (chronic) (peripheral): Secondary | ICD-10-CM | POA: Diagnosis not present

## 2021-09-25 DIAGNOSIS — K219 Gastro-esophageal reflux disease without esophagitis: Secondary | ICD-10-CM | POA: Diagnosis not present

## 2021-09-28 ENCOUNTER — Telehealth: Payer: Self-pay | Admitting: *Deleted

## 2021-09-28 NOTE — Telephone Encounter (Signed)
Attempted Ortho bundle 7 day call to patient; no answer and left VM requesting call back. ?

## 2021-09-29 DIAGNOSIS — Z471 Aftercare following joint replacement surgery: Secondary | ICD-10-CM | POA: Diagnosis not present

## 2021-09-29 DIAGNOSIS — E1159 Type 2 diabetes mellitus with other circulatory complications: Secondary | ICD-10-CM | POA: Diagnosis not present

## 2021-09-29 DIAGNOSIS — I159 Secondary hypertension, unspecified: Secondary | ICD-10-CM | POA: Diagnosis not present

## 2021-09-29 DIAGNOSIS — J302 Other seasonal allergic rhinitis: Secondary | ICD-10-CM | POA: Diagnosis not present

## 2021-09-29 DIAGNOSIS — G4730 Sleep apnea, unspecified: Secondary | ICD-10-CM | POA: Diagnosis not present

## 2021-09-29 DIAGNOSIS — E785 Hyperlipidemia, unspecified: Secondary | ICD-10-CM | POA: Diagnosis not present

## 2021-09-29 DIAGNOSIS — E1169 Type 2 diabetes mellitus with other specified complication: Secondary | ICD-10-CM | POA: Diagnosis not present

## 2021-09-29 DIAGNOSIS — Z87891 Personal history of nicotine dependence: Secondary | ICD-10-CM | POA: Diagnosis not present

## 2021-09-29 DIAGNOSIS — E1151 Type 2 diabetes mellitus with diabetic peripheral angiopathy without gangrene: Secondary | ICD-10-CM | POA: Diagnosis not present

## 2021-09-29 DIAGNOSIS — Z7984 Long term (current) use of oral hypoglycemic drugs: Secondary | ICD-10-CM | POA: Diagnosis not present

## 2021-09-29 DIAGNOSIS — I872 Venous insufficiency (chronic) (peripheral): Secondary | ICD-10-CM | POA: Diagnosis not present

## 2021-09-29 DIAGNOSIS — M109 Gout, unspecified: Secondary | ICD-10-CM | POA: Diagnosis not present

## 2021-09-29 DIAGNOSIS — K219 Gastro-esophageal reflux disease without esophagitis: Secondary | ICD-10-CM | POA: Diagnosis not present

## 2021-09-29 DIAGNOSIS — Z96642 Presence of left artificial hip joint: Secondary | ICD-10-CM | POA: Diagnosis not present

## 2021-09-29 DIAGNOSIS — Z7982 Long term (current) use of aspirin: Secondary | ICD-10-CM | POA: Diagnosis not present

## 2021-09-29 DIAGNOSIS — H9190 Unspecified hearing loss, unspecified ear: Secondary | ICD-10-CM | POA: Diagnosis not present

## 2021-10-01 ENCOUNTER — Telehealth: Payer: Self-pay | Admitting: *Deleted

## 2021-10-01 ENCOUNTER — Other Ambulatory Visit: Payer: Self-pay | Admitting: Physician Assistant

## 2021-10-01 MED ORDER — OXYCODONE-ACETAMINOPHEN 5-325 MG PO TABS: 1.0000 | ORAL_TABLET | Freq: Four times a day (QID) | ORAL | 0 refills | Status: DC | PRN

## 2021-10-01 MED ORDER — METHOCARBAMOL 500 MG PO TABS: 500.0000 mg | ORAL_TABLET | Freq: Two times a day (BID) | ORAL | 2 refills | Status: DC | PRN

## 2021-10-01 NOTE — Telephone Encounter (Signed)
Sent in

## 2021-10-01 NOTE — Telephone Encounter (Signed)
Patient made aware that refill sent to pharmacy. ?

## 2021-10-01 NOTE — Telephone Encounter (Signed)
7 day Ortho bundle call completed. Patient requesting refill of pain medication. Pharmacy in chart. Thanks. ?

## 2021-10-02 DIAGNOSIS — J302 Other seasonal allergic rhinitis: Secondary | ICD-10-CM | POA: Diagnosis not present

## 2021-10-02 DIAGNOSIS — G4730 Sleep apnea, unspecified: Secondary | ICD-10-CM | POA: Diagnosis not present

## 2021-10-02 DIAGNOSIS — E785 Hyperlipidemia, unspecified: Secondary | ICD-10-CM | POA: Diagnosis not present

## 2021-10-02 DIAGNOSIS — M109 Gout, unspecified: Secondary | ICD-10-CM | POA: Diagnosis not present

## 2021-10-02 DIAGNOSIS — E1159 Type 2 diabetes mellitus with other circulatory complications: Secondary | ICD-10-CM | POA: Diagnosis not present

## 2021-10-02 DIAGNOSIS — E1169 Type 2 diabetes mellitus with other specified complication: Secondary | ICD-10-CM | POA: Diagnosis not present

## 2021-10-02 DIAGNOSIS — Z7982 Long term (current) use of aspirin: Secondary | ICD-10-CM | POA: Diagnosis not present

## 2021-10-02 DIAGNOSIS — E1151 Type 2 diabetes mellitus with diabetic peripheral angiopathy without gangrene: Secondary | ICD-10-CM | POA: Diagnosis not present

## 2021-10-02 DIAGNOSIS — Z87891 Personal history of nicotine dependence: Secondary | ICD-10-CM | POA: Diagnosis not present

## 2021-10-02 DIAGNOSIS — Z7984 Long term (current) use of oral hypoglycemic drugs: Secondary | ICD-10-CM | POA: Diagnosis not present

## 2021-10-02 DIAGNOSIS — K219 Gastro-esophageal reflux disease without esophagitis: Secondary | ICD-10-CM | POA: Diagnosis not present

## 2021-10-02 DIAGNOSIS — I159 Secondary hypertension, unspecified: Secondary | ICD-10-CM | POA: Diagnosis not present

## 2021-10-02 DIAGNOSIS — I872 Venous insufficiency (chronic) (peripheral): Secondary | ICD-10-CM | POA: Diagnosis not present

## 2021-10-02 DIAGNOSIS — H9190 Unspecified hearing loss, unspecified ear: Secondary | ICD-10-CM | POA: Diagnosis not present

## 2021-10-02 DIAGNOSIS — Z96642 Presence of left artificial hip joint: Secondary | ICD-10-CM | POA: Diagnosis not present

## 2021-10-02 DIAGNOSIS — Z471 Aftercare following joint replacement surgery: Secondary | ICD-10-CM | POA: Diagnosis not present

## 2021-10-05 ENCOUNTER — Encounter: Payer: Self-pay | Admitting: Orthopaedic Surgery

## 2021-10-05 ENCOUNTER — Ambulatory Visit (INDEPENDENT_AMBULATORY_CARE_PROVIDER_SITE_OTHER): Payer: Medicare Other | Admitting: Physician Assistant

## 2021-10-05 DIAGNOSIS — Z96642 Presence of left artificial hip joint: Secondary | ICD-10-CM

## 2021-10-05 NOTE — Progress Notes (Signed)
? ?Post-Op Visit Note ?  ?Patient: Gabriel Fernandez           ?Date of Birth: 1955/11/19           ?MRN: [id number] ?Visit Date: 10/05/2021 ?PCP: Horald Pollen, MD ? ? ?Assessment & Plan: ? ?Chief Complaint:  ?Chief Complaint  ?Patient presents with  ? Left Hip - Pain  ? ?Visit Diagnoses:  ?1. Status post total hip replacement, left   ? ? ?Plan: Patient is a pleasant 66 year old gentleman who comes in today 2 weeks status post left total hip replacement 09/20/2021.  He has been doing well.  He is getting him off physical therapy and is ambulating with a cane.  He has been compliant taking his baby aspirin twice daily for DVT prophylaxis.  Examination of the left hip reveals a well-healed surgical incision with nylon sutures in place.  No evidence of infection or cellulitis.  Calves are soft nontender.  He is neurovascular intact distally.  Today, sutures were removed and Steri-Strips applied.  He will continue his baby aspirin twice daily for another 4 weeks for DVT prophylaxis.  He will continue with his home exercise program.  Dental prophylaxis reinforced.  Follow-up with [place] in 4 weeks time for repeat evaluation and AP pelvis x-rays.  Call with concerns or questions in the meantime. ? ?Follow-Up Instructions: Return in about 4 weeks (around 11/02/2021).  ? ?Orders:  ?No orders of the defined types were placed in this encounter. ? ?No orders of the defined types were placed in this encounter. ? ? ?Imaging: ?No new imaging ? ?PMFS History: ?Patient Active Problem List  ? Diagnosis Date Noted  ? Degenerative joint disease of right hip 09/20/2021  ? Status post total replacement of left hip 09/20/2021  ? Unilateral primary osteoarthritis, left hip   ? Hypertension associated with diabetes ([place]) 03/04/2021  ? Dyslipidemia associated with type 2 diabetes mellitus ([place]) 01/30/2020  ? PVD (peripheral vascular disease) ([place]) 01/30/2020  ? Chronic venous insufficiency 01/30/2020  ? Benign prostatic hyperplasia  01/30/2020  ? Tobacco use disorder, continuous 01/30/2020  ? Smoker 06/08/2013  ? Other and unspecified hyperlipidemia 06/08/2013  ? Gout 06/08/2013  ? Morbid obesity ([place]) 06/08/2013  ? ?Past Medical History:  ?Diagnosis Date  ? Allergy   ? seasonal allergies  ? Arthritis   ? Diabetes mellitus without complication ([place])   ? type 2, non-insulin dependent  ? GERD (gastroesophageal reflux disease)   ? Gout   ? Hearing loss   ? dos not use hearing aids  ? Hyperlipidemia   ? Sleep apnea   ? Not on CPAP  ?  ?Family History  ?Problem Relation Age of Onset  ? Cancer Mother   ? Ovarian cancer Mother   ? Stomach cancer Mother   ? Colon cancer Neg Hx   ? Esophageal cancer Neg Hx   ? Rectal cancer Neg Hx   ? Colon polyps Neg Hx   ?  ?Past Surgical History:  ?Procedure Laterality Date  ? COLONOSCOPY    ? sev colon's   ? POLYPECTOMY    ? TOTAL HIP ARTHROPLASTY Left 09/20/2021  ? Procedure: LEFT TOTAL HIP ARTHROPLASTY ANTERIOR APPROACH;  Surgeon: Leandrew Koyanagi, MD;  Location: [facility];  Service: Orthopedics;  Laterality: Left;  3-C  ? ?Social History  ? ?Occupational History  ? Not on file  ?Tobacco Use  ? Smoking status: Some Days  ?  Packs/day: 1.00  ?  Years: 35.00  ?  Pack years: 35.00  ?  Types: Cigarettes  ? Smokeless tobacco: Never  ? Tobacco comments:  ?  1-2 cigs a day or more   ?Vaping Use  ? Vaping Use: Never used  ?Substance and Sexual Activity  ? Alcohol use: Yes  ?  Alcohol/week: 12.0 standard drinks  ?  Types: 6 Glasses of wine, 6 Cans of beer per week  ? Drug use: No  ? Sexual activity: Not Currently  ? ? ? ?

## 2021-10-06 DIAGNOSIS — Z7984 Long term (current) use of oral hypoglycemic drugs: Secondary | ICD-10-CM | POA: Diagnosis not present

## 2021-10-06 DIAGNOSIS — Z87891 Personal history of nicotine dependence: Secondary | ICD-10-CM | POA: Diagnosis not present

## 2021-10-06 DIAGNOSIS — K219 Gastro-esophageal reflux disease without esophagitis: Secondary | ICD-10-CM | POA: Diagnosis not present

## 2021-10-06 DIAGNOSIS — E1169 Type 2 diabetes mellitus with other specified complication: Secondary | ICD-10-CM | POA: Diagnosis not present

## 2021-10-06 DIAGNOSIS — M109 Gout, unspecified: Secondary | ICD-10-CM | POA: Diagnosis not present

## 2021-10-06 DIAGNOSIS — Z96642 Presence of left artificial hip joint: Secondary | ICD-10-CM | POA: Diagnosis not present

## 2021-10-06 DIAGNOSIS — E1159 Type 2 diabetes mellitus with other circulatory complications: Secondary | ICD-10-CM | POA: Diagnosis not present

## 2021-10-06 DIAGNOSIS — Z471 Aftercare following joint replacement surgery: Secondary | ICD-10-CM | POA: Diagnosis not present

## 2021-10-06 DIAGNOSIS — I872 Venous insufficiency (chronic) (peripheral): Secondary | ICD-10-CM | POA: Diagnosis not present

## 2021-10-06 DIAGNOSIS — I159 Secondary hypertension, unspecified: Secondary | ICD-10-CM | POA: Diagnosis not present

## 2021-10-06 DIAGNOSIS — Z7982 Long term (current) use of aspirin: Secondary | ICD-10-CM | POA: Diagnosis not present

## 2021-10-06 DIAGNOSIS — H9190 Unspecified hearing loss, unspecified ear: Secondary | ICD-10-CM | POA: Diagnosis not present

## 2021-10-06 DIAGNOSIS — E1151 Type 2 diabetes mellitus with diabetic peripheral angiopathy without gangrene: Secondary | ICD-10-CM | POA: Diagnosis not present

## 2021-10-06 DIAGNOSIS — J302 Other seasonal allergic rhinitis: Secondary | ICD-10-CM | POA: Diagnosis not present

## 2021-10-06 DIAGNOSIS — E785 Hyperlipidemia, unspecified: Secondary | ICD-10-CM | POA: Diagnosis not present

## 2021-10-06 DIAGNOSIS — G4730 Sleep apnea, unspecified: Secondary | ICD-10-CM | POA: Diagnosis not present

## 2021-10-07 DIAGNOSIS — J302 Other seasonal allergic rhinitis: Secondary | ICD-10-CM | POA: Diagnosis not present

## 2021-10-07 DIAGNOSIS — Z471 Aftercare following joint replacement surgery: Secondary | ICD-10-CM | POA: Diagnosis not present

## 2021-10-07 DIAGNOSIS — H9190 Unspecified hearing loss, unspecified ear: Secondary | ICD-10-CM | POA: Diagnosis not present

## 2021-10-07 DIAGNOSIS — G4730 Sleep apnea, unspecified: Secondary | ICD-10-CM | POA: Diagnosis not present

## 2021-10-07 DIAGNOSIS — Z7982 Long term (current) use of aspirin: Secondary | ICD-10-CM | POA: Diagnosis not present

## 2021-10-07 DIAGNOSIS — E1151 Type 2 diabetes mellitus with diabetic peripheral angiopathy without gangrene: Secondary | ICD-10-CM | POA: Diagnosis not present

## 2021-10-07 DIAGNOSIS — Z87891 Personal history of nicotine dependence: Secondary | ICD-10-CM | POA: Diagnosis not present

## 2021-10-07 DIAGNOSIS — E785 Hyperlipidemia, unspecified: Secondary | ICD-10-CM | POA: Diagnosis not present

## 2021-10-07 DIAGNOSIS — Z7984 Long term (current) use of oral hypoglycemic drugs: Secondary | ICD-10-CM | POA: Diagnosis not present

## 2021-10-07 DIAGNOSIS — K219 Gastro-esophageal reflux disease without esophagitis: Secondary | ICD-10-CM | POA: Diagnosis not present

## 2021-10-07 DIAGNOSIS — I872 Venous insufficiency (chronic) (peripheral): Secondary | ICD-10-CM | POA: Diagnosis not present

## 2021-10-07 DIAGNOSIS — I159 Secondary hypertension, unspecified: Secondary | ICD-10-CM | POA: Diagnosis not present

## 2021-10-07 DIAGNOSIS — Z96642 Presence of left artificial hip joint: Secondary | ICD-10-CM | POA: Diagnosis not present

## 2021-10-07 DIAGNOSIS — E1159 Type 2 diabetes mellitus with other circulatory complications: Secondary | ICD-10-CM | POA: Diagnosis not present

## 2021-10-07 DIAGNOSIS — M109 Gout, unspecified: Secondary | ICD-10-CM | POA: Diagnosis not present

## 2021-10-07 DIAGNOSIS — E1169 Type 2 diabetes mellitus with other specified complication: Secondary | ICD-10-CM | POA: Diagnosis not present

## 2021-10-10 ENCOUNTER — Encounter: Payer: Self-pay | Admitting: Orthopaedic Surgery

## 2021-11-02 ENCOUNTER — Other Ambulatory Visit: Payer: Self-pay | Admitting: Physician Assistant

## 2021-11-02 ENCOUNTER — Encounter: Payer: Medicare Other | Admitting: Orthopaedic Surgery

## 2021-11-05 ENCOUNTER — Ambulatory Visit (INDEPENDENT_AMBULATORY_CARE_PROVIDER_SITE_OTHER): Payer: Medicare Other

## 2021-11-05 DIAGNOSIS — Z0000 Encounter for general adult medical examination without abnormal findings: Secondary | ICD-10-CM | POA: Diagnosis not present

## 2021-11-05 NOTE — Patient Instructions (Signed)
Gabriel Fernandez , Thank you for taking time to come for your Medicare Wellness Visit. I appreciate your ongoing commitment to your health goals. Please review the following plan we discussed and let me know if I can assist you in the future.   Screening recommendations/referrals: Colonoscopy: 03/19/2020; due every 5 years Recommended yearly ophthalmology/optometry visit for glaucoma screening and checkup Recommended yearly dental visit for hygiene and checkup  Vaccinations: Influenza vaccine: 03/04/2021 Pneumococcal vaccine: not sure about date (per patient) Tdap vaccine: 04/26/2017; due every 10 years Shingles vaccine: 08/01/2018   Covid-19: 08/12/2019, 09/09/2019, 04/17/2020, 01/14/2021  Advanced directives: Yes  Conditions/risks identified: Yes  Next appointment: Please schedule your next Medicare Wellness Visit with your Nurse Health Advisor in 1 year by calling [phone].  Preventive Care 25 Years and Older, Male Preventive care refers to lifestyle choices and visits with your health care provider that can promote health and wellness. What does preventive care include? A yearly physical exam. This is also called an annual well check. Dental exams once or twice a year. Routine eye exams. Ask your health care provider how often you should have your eyes checked. Personal lifestyle choices, including: Daily care of your teeth and gums. Regular physical activity. Eating a healthy diet. Avoiding tobacco and drug use. Limiting alcohol use. Practicing safe sex. Taking low doses of aspirin every day. Taking vitamin and mineral supplements as recommended by your health care provider. What happens during an annual well check? The services and screenings done by your health care provider during your annual well check will depend on your age, overall health, lifestyle risk factors, and family history of disease. Counseling  Your health care provider may ask you questions about your: Alcohol  use. Tobacco use. Drug use. Emotional well-being. Home and relationship well-being. Sexual activity. Eating habits. History of falls. Memory and ability to understand (cognition). Work and work Statistician. Screening  You may have the following tests or measurements: Height, weight, and BMI. Blood pressure. Lipid and cholesterol levels. These may be checked every 5 years, or more frequently if you are over 81 years old. Skin check. Lung cancer screening. You may have this screening every year starting at age 73 if you have a 30-pack-year history of smoking and currently smoke or have quit within the past 15 years. Fecal occult blood test (FOBT) of the stool. You may have this test every year starting at age 52. Flexible sigmoidoscopy or colonoscopy. You may have a sigmoidoscopy every 5 years or a colonoscopy every 10 years starting at age 76. Prostate cancer screening. Recommendations will vary depending on your family history and other risks. Hepatitis C blood test. Hepatitis B blood test. Sexually transmitted disease (STD) testing. Diabetes screening. This is done by checking your blood sugar (glucose) after you have not eaten for a while (fasting). You may have this done every 1-3 years. Abdominal aortic aneurysm (AAA) screening. You may need this if you are a current or former smoker. Osteoporosis. You may be screened starting at age 52 if you are at high risk. Talk with your health care provider about your test results, treatment options, and if necessary, the need for more tests. Vaccines  Your health care provider may recommend certain vaccines, such as: Influenza vaccine. This is recommended every year. Tetanus, diphtheria, and acellular pertussis (Tdap, Td) vaccine. You may need a Td booster every 10 years. Zoster vaccine. You may need this after age 63. Pneumococcal 13-valent conjugate (PCV13) vaccine. One dose is recommended after age 29.  Pneumococcal polysaccharide  (PPSV23) vaccine. One dose is recommended after age 17. Talk to your health care provider about which screenings and vaccines you need and how often you need them. This information is not intended to replace advice given to you by your health care provider. Make sure you discuss any questions you have with your health care provider. Document Released: 06/19/2015 Document Revised: 02/10/2016 Document Reviewed: 03/24/2015 Elsevier Interactive Patient Education  2017 [place] Prevention in the Home Falls can cause injuries. They can happen to people of all ages. There are many things you can do to make your home safe and to help prevent falls. What can I do on the outside of my home? Regularly fix the edges of walkways and driveways and fix any cracks. Remove anything that might make you trip as you walk through a door, such as a raised step or threshold. Trim any bushes or trees on the path to your home. Use bright outdoor lighting. Clear any walking paths of anything that might make someone trip, such as rocks or tools. Regularly check to see if handrails are loose or broken. Make sure that both sides of any steps have handrails. Any raised decks and porches should have guardrails on the edges. Have any leaves, snow, or ice cleared regularly. Use sand or salt on walking paths during winter. Clean up any spills in your garage right away. This includes oil or grease spills. What can I do in the bathroom? Use night lights. Install grab bars by the toilet and in the tub and shower. Do not use towel bars as grab bars. Use non-skid mats or decals in the tub or shower. If you need to sit down in the shower, use a plastic, non-slip stool. Keep the floor dry. Clean up any water that spills on the floor as soon as it happens. Remove soap buildup in the tub or shower regularly. Attach bath mats securely with double-sided non-slip rug tape. Do not have throw rugs and other things on the  floor that can make you trip. What can I do in the bedroom? Use night lights. Make sure that you have a light by your bed that is easy to reach. Do not use any sheets or blankets that are too big for your bed. They should not hang down onto the floor. Have a firm chair that has side arms. You can use this for support while you get dressed. Do not have throw rugs and other things on the floor that can make you trip. What can I do in the kitchen? Clean up any spills right away. Avoid walking on wet floors. Keep items that you use a lot in easy-to-reach places. If you need to reach something above you, use a strong step stool that has a grab bar. Keep electrical cords out of the way. Do not use floor polish or wax that makes floors slippery. If you must use wax, use non-skid floor wax. Do not have throw rugs and other things on the floor that can make you trip. What can I do with my stairs? Do not leave any items on the stairs. Make sure that there are handrails on both sides of the stairs and use them. Fix handrails that are broken or loose. Make sure that handrails are as long as the stairways. Check any carpeting to make sure that it is firmly attached to the stairs. Fix any carpet that is loose or worn. Avoid having throw rugs at the  top or bottom of the stairs. If you do have throw rugs, attach them to the floor with carpet tape. Make sure that you have a light switch at the top of the stairs and the bottom of the stairs. If you do not have them, ask someone to add them for you. What else can I do to help prevent falls? Wear shoes that: Do not have high heels. Have rubber bottoms. Are comfortable and fit you well. Are closed at the toe. Do not wear sandals. If you use a stepladder: Make sure that it is fully opened. Do not climb a closed stepladder. Make sure that both sides of the stepladder are locked into place. Ask someone to hold it for you, if possible. Clearly mark and make  sure that you can see: Any grab bars or handrails. First and last steps. Where the edge of each step is. Use tools that help you move around (mobility aids) if they are needed. These include: Canes. Walkers. Scooters. Crutches. Turn on the lights when you go into a dark area. Replace any light bulbs as soon as they burn out. Set up your furniture so you have a clear path. Avoid moving your furniture around. If any of your floors are uneven, fix them. If there are any pets around you, be aware of where they are. Review your medicines with your doctor. Some medicines can make you feel dizzy. This can increase your chance of falling. Ask your doctor what other things that you can do to help prevent falls. This information is not intended to replace advice given to you by your health care provider. Make sure you discuss any questions you have with your health care provider. Document Released: 03/19/2009 Document Revised: 10/29/2015 Document Reviewed: 06/27/2014 Elsevier Interactive Patient Education  2017 [employer].

## 2021-11-05 NOTE — Progress Notes (Signed)
I connected with [name] today by telephone and verified that I am speaking with the correct person using two identifiers. Location patient: home Location provider: work Persons participating in the virtual visit: patient, provider.   I discussed the limitations, risks, security and privacy concerns of performing an evaluation and management service by telephone and the availability of in person appointments. I also discussed with the patient that there may be a patient responsible charge related to this service. The patient expressed understanding and verbally consented to this telephonic visit.    Interactive audio and video telecommunications were attempted between this provider and patient, however failed, due to patient having technical difficulties OR patient did not have access to video capability.  We continued and completed visit with audio only.  Some vital signs may be absent or patient reported.   Time Spent with patient on telephone encounter: 30 minutes  Subjective:   Gabriel Fernandez is a 66 y.o. male who presents for Medicare Annual/Subsequent preventive examination.  Review of Systems     Cardiac Risk Factors include: advanced age (>34mn, >>88women);diabetes mellitus;dyslipidemia;hypertension;male gender;obesity (BMI >30kg/m2);smoking/ tobacco exposure     Objective:    There were no vitals filed for this visit. There is no height or weight on file to calculate BMI.     11/05/2021    2:12 PM 09/10/2021   11:23 AM 11/27/2018    1:40 PM 09/19/2016   10:50 AM 09/05/2016    9:11 AM  Advanced Directives  Does Patient Have a Medical Advance Directive? Yes Yes Yes Yes Yes  Type of Advance Directive Living will Living will [place] will  Does patient want to make changes to medical advance directive? No - Patient declined No - Patient declined     Copy of  [place] Chart?   No - copy requested No - copy requested     Current Medications (verified) Outpatient Encounter Medications as of 11/05/2021  Medication Sig   aspirin EC 81 MG tablet Take 1 tablet (81 mg total) by mouth 2 (two) times daily. To be taken after surgery to prevent blood   atorvastatin (LIPITOR) 20 MG tablet Take 1 tablet (20 mg total) by mouth daily.   lisinopril (ZESTRIL) 20 MG tablet Take 1 tablet (20 mg total) by mouth daily.   metFORMIN (GLUCOPHAGE) 500 MG tablet Take 1 tablet (500 mg total) by mouth 2 (two) times daily with a meal.   tamsulosin (FLOMAX) 0.4 MG CAPS capsule Take 1 capsule (0.4 mg total) by mouth daily after breakfast.   [DISCONTINUED] docusate sodium (COLACE) 100 MG capsule Take 1 capsule (100 mg total) by mouth daily as needed.   [DISCONTINUED] methocarbamol (ROBAXIN) 500 MG tablet Take 1 tablet (500 mg total) by mouth 2 (two) times daily as needed. To be taken after surgery   [DISCONTINUED] ondansetron (ZOFRAN) 4 MG tablet Take 1 tablet (4 mg total) by mouth every 8 (eight) hours as needed for nausea or vomiting.   [DISCONTINUED] oxyCODONE-acetaminophen (PERCOCET) 5-325 MG tablet Take 1-2 tablets by mouth every 6 (six) hours as needed. To be taken after surgery   No facility-administered encounter medications on file as of 11/05/2021.    Allergies (verified) Patient has no known allergies.   History: Past Medical History:  Diagnosis Date   Allergy    seasonal allergies   Arthritis    Diabetes mellitus without complication ([place]  type 2, non-insulin dependent   GERD (gastroesophageal reflux disease)    Gout    Hearing loss    dos not use hearing aids   Hyperlipidemia    Sleep apnea    Not on CPAP   Past Surgical History:  Procedure Laterality Date   COLONOSCOPY     sev colon's    POLYPECTOMY     TOTAL HIP ARTHROPLASTY Left 09/20/2021   Procedure: LEFT TOTAL HIP ARTHROPLASTY ANTERIOR APPROACH;  Surgeon: Leandrew Koyanagi,  MD;  Location: [facility];  Service: Orthopedics;  Laterality: Left;  3-C   Family History  Problem Relation Age of Onset   Cancer Mother    Ovarian cancer Mother    Stomach cancer Mother    Colon cancer Neg Hx    Esophageal cancer Neg Hx    Rectal cancer Neg Hx    Colon polyps Neg Hx    Social History   Socioeconomic History   Marital status: Single    Spouse name: Not on file   Number of children: Not on file   Years of education: Not on file   Highest education level: Not on file  Occupational History   Not on file  Tobacco Use   Smoking status: Some Days    Packs/day: 1.00    Years: 35.00    Pack years: 35.00    Types: Cigarettes   Smokeless tobacco: Never   Tobacco comments:    1-2 cigs a day or more   Vaping Use   Vaping Use: Never used  Substance and Sexual Activity   Alcohol use: Yes    Alcohol/week: 12.0 standard drinks    Types: 6 Glasses of wine, 6 Cans of beer per week   Drug use: No   Sexual activity: Not Currently  Other Topics Concern   Not on file  Social History Narrative   Not on file   Social Determinants of Health   Financial Resource Strain: Low Risk    Difficulty of Paying Living Expenses: Not hard at all  Food Insecurity: No Food Insecurity   Worried About Charity fundraiser in the Last Year: Never true   Ran Out of Food in the Last Year: Never true  Transportation Needs: No Transportation Needs   Lack of Transportation (Medical): No   Lack of Transportation (Non-Medical): No  Physical Activity: Sufficiently Active   Days of Exercise per Week: 7 days   Minutes of Exercise per Session: 30 min  Stress: No Stress Concern Present   Feeling of Stress : Not at all  Social Connections: Not on file    Tobacco Counseling Ready to quit: Not Answered Counseling given: Not Answered Tobacco comments: 1-2 cigs a day or more    Clinical Intake:  Pre-visit preparation completed: Yes  Pain : No/denies pain     BMI - recorded:  39.2 Nutritional Risks: None Diabetes: Yes CBG done?: No Did pt. bring in CBG monitor from home?: No  How often do you need to have someone help you when you read instructions, pamphlets, or other written materials from your doctor or pharmacy?: 1 - Never What is the last grade level you completed in school?: Master's Degree  Diabetic? yes  Interpreter Needed?: No  Information entered by :: Lisette Abu, LPN.   Activities of Daily Living    11/05/2021    2:15 PM 09/10/2021   11:28 AM  In your present state of health, do you have any difficulty performing the following  activities:  Hearing? 1   Vision? 0   Difficulty concentrating or making decisions? 0   Walking or climbing stairs? 0   Dressing or bathing? 0   Doing errands, shopping? 0 0  Preparing Food and eating ? N   Using the Toilet? N   In the past six months, have you accidently leaked urine? N   Do you have problems with loss of bowel control? N   Managing your Medications? N   Managing your Finances? N   Housekeeping or managing your Housekeeping? N     Patient Care Team: Horald Pollen, MD as PCP - General (Internal Medicine)  Indicate any recent Medical Services you may have received from other than [facility] providers in the past year (date may be approximate).     Assessment:   This is a routine wellness examination for Eann.  Hearing/Vision screen Hearing Screening - Comments:: Patient has hearing difficulty and does not wear his hearing aids. Vision Screening - Comments:: Patient does wears readers for reading and computer. Eye exam done by: [facility]   Dietary issues and exercise activities discussed: Current Exercise Habits: Home exercise routine, Type of exercise: walking, Time (Minutes): 30, Frequency (Times/Week): 5, Weekly Exercise (Minutes/Week): 150, Intensity: Moderate, Exercise limited by: cardiac condition(s)   Goals Addressed             This Visit's Progress     Patient declined health goal at this time.        Depression Screen    09/15/2021    3:04 PM 09/02/2021    1:05 PM 03/04/2021    1:20 PM 08/06/2020    2:48 PM 11/27/2018    1:35 PM 06/15/2018    3:09 PM 11/10/2017    2:45 PM  PHQ 2/9 Scores  PHQ - 2 Score 0 0 0 0 0 0 0    Fall Risk    11/05/2021    1:52 PM 09/15/2021    3:03 PM 09/02/2021    1:05 PM 03/04/2021    1:19 PM 08/06/2020    2:47 PM  [place] in the past year? 0 0 0 0 0  Number falls in past yr: 0 0  0   Injury with Fall? 0 0  0   Risk for fall due to : No Fall Risks No Fall Risks     Follow up Falls evaluation completed Falls evaluation completed   Falls evaluation completed    [place]:  Any stairs in or around the home? Yes  If so, are there any without handrails? No  Home free of loose throw rugs in walkways, pet beds, electrical cords, etc? Yes  Adequate lighting in your home to reduce risk of falls? Yes   ASSISTIVE DEVICES UTILIZED TO PREVENT FALLS:  Life alert? No  Use of a cane, walker or w/c? No  Grab bars in the bathroom? No  Shower chair or bench in shower? No  Elevated toilet seat or a handicapped toilet? No   TIMED UP AND GO:  Was the test performed? No .  Length of time to ambulate 10 feet: n/a sec.   Appearance of gait: Patient not evaluated for gait during this visit.  Cognitive Function:        11/05/2021    2:16 PM 11/27/2018    1:38 PM  6CIT Screen  What Year? 0 points 0 points  What month? 0 points 0 points  What time? 0 points 0 points  Count back from 20 0 points 0 points  Months in reverse 0 points 0 points  Repeat phrase 0 points 0 points  Total Score 0 points 0 points    Immunizations Immunization History  Administered Date(s) Administered   Fluad Quad(high Dose 65+) 03/04/2021   Hepatitis A, Adult 09/06/2013, 04/26/2017   Influenza,inj,Quad PF,6+ Mos 06/08/2013, 04/26/2017, 05/29/2018   Influenza-Unspecified 03/01/2019, 04/17/2020    Moderna Sars-Covid-2 Vaccination 08/12/2019, 09/09/2019, 01/14/2021   PFIZER(Purple Top)SARS-COV-2 Vaccination 04/17/2020   PNEUMOCOCCAL CONJUGATE-20 01/14/2021   Pneumococcal Conjugate-13 04/26/2017   Pneumococcal Polysaccharide-23 11/27/2018   Tdap 04/26/2017   Zoster Recombinat (Shingrix) 08/01/2018    TDAP status: Up to date  Flu Vaccine status: Up to date  Pneumococcal vaccine status: Up to date  Covid-19 vaccine status: Completed vaccines  Qualifies for Shingles Vaccine? Yes   Zostavax completed Yes   Shingrix Completed?: No.    Education has been provided regarding the importance of this vaccine. Patient has been advised to call insurance company to determine out of pocket expense if they have not yet received this vaccine. Advised may also receive vaccine at local pharmacy or Health Dept. Verbalized acceptance and understanding.  Screening Tests Health Maintenance  Topic Date Due   Zoster Vaccines- Shingrix (2 of 2) 09/26/2018   FOOT EXAM  06/16/2019   COVID-19 Vaccine (5 - Booster for Moderna series) 03/11/2021   OPHTHALMOLOGY EXAM  06/16/2021   INFLUENZA VACCINE  01/04/2022   HEMOGLOBIN A1C  01/24/2022   COLONOSCOPY (Pts 45-45yr Insurance coverage will need to be confirmed)  03/19/2025   TETANUS/TDAP  04/27/2027   Pneumonia Vaccine 68 Years old  Completed   Hepatitis C Screening  Completed   HPV VACCINES  Aged Out    Health Maintenance  Health Maintenance Due  Topic Date Due   Zoster Vaccines- Shingrix (2 of 2) 09/26/2018   FOOT EXAM  06/16/2019   COVID-19 Vaccine (5 - Booster for Moderna series) 03/11/2021   OPHTHALMOLOGY EXAM  06/16/2021    Colorectal cancer screening: Type of screening: Colonoscopy. Completed 03/19/2020. Repeat every 5 years  Lung Cancer Screening: (Low Dose CT Chest recommended if Age 68-80years, 30 pack-year currently smoking OR have quit w/in 15years.) does not qualify.   Lung Cancer Screening Referral: no  Additional  Screening:  Hepatitis C Screening: does qualify; Completed 04/26/2017  Vision Screening: Recommended annual ophthalmology exams for early detection of glaucoma and other disorders of the eye. Is the patient up to date with their annual eye exam?  No  Who is the provider or what is the name of the office in which the patient attends annual [facility] exams[facility] not established with a provider, would they like to be referred to a provider to establish care? No .   Dental Screening: Recommended annual dental exams for proper oral hygiene  Community Resource Referral / Chronic Care Management: CRR required this visit?  No   CCM required this visit?  No      Plan:     I have personally reviewed and noted the following in the patient's chart:   Medical and social history Use of alcohol, tobacco or illicit drugs  Current medications and supplements including opioid prescriptions. Patient is not currently taking opioid prescriptions. Functional ability and status Nutritional status Physical activity Advanced directives List of other physicians Hospitalizations, surgeries, and ER visits in previous 12 months Vitals Screenings to include cognitive, depression, and falls  Referrals and appointments  In addition, I have reviewed and discussed with patient certain preventive protocols, quality metrics, and best practice recommendations. A written personalized care plan for preventive services as well as general preventive health recommendations were provided to patient.     Sheral Flow, LPN   7/0/7867   Nurse Notes:  There were no vitals filed for this visit. There is no height or weight on file to calculate BMI.

## 2021-11-23 ENCOUNTER — Ambulatory Visit (INDEPENDENT_AMBULATORY_CARE_PROVIDER_SITE_OTHER): Payer: Medicare Other | Admitting: Physician Assistant

## 2021-11-23 ENCOUNTER — Ambulatory Visit (INDEPENDENT_AMBULATORY_CARE_PROVIDER_SITE_OTHER): Payer: Medicare Other

## 2021-11-23 ENCOUNTER — Encounter: Payer: Self-pay | Admitting: Orthopaedic Surgery

## 2021-11-23 DIAGNOSIS — G8929 Other chronic pain: Secondary | ICD-10-CM

## 2021-11-23 DIAGNOSIS — M25562 Pain in left knee: Secondary | ICD-10-CM

## 2021-11-23 DIAGNOSIS — Z96642 Presence of left artificial hip joint: Secondary | ICD-10-CM

## 2021-11-23 MED ORDER — AMOXICILLIN 500 MG PO CAPS: ORAL_CAPSULE | ORAL | 2 refills | Status: DC

## 2021-11-23 NOTE — Progress Notes (Signed)
Office Visit Note   Patient: Gabriel Fernandez           Date of Birth: [birth year]           MRN: [id number] Visit Date: 11/23/2021              Requested by: Horald Pollen, [place],   [id number] PCP: Horald Pollen, MD   Assessment & Plan: Visit Diagnoses:  1. Status post total hip replacement, left   2. Chronic pain of left knee     Plan: Impression is 6 weeks status post left total hip replacement and chronic, but intermittent right knee pain.  In regards to his hip, he is doing well.  He will continue to advance with activity as tolerated.  Dental prophylaxis reinforced.  Follow-up in 6 weeks for recheck.  In regards to the knee, we discussed that he does have some degree of arthritis and chondrocalcinosis seen on x-ray but that some of this pain could also be referred from his hip as he does note that this is slightly improved since surgery.  He tells me that his symptoms were not bad enough to do anything about at this time.  He will follow-up with [place] for his knee they worsen.  Call with concerns or questions.  Follow-Up Instructions: Return in about 6 weeks (around 01/04/2022).   Orders:  Orders Placed This Encounter  Procedures   XR Pelvis 1-2 Views   XR KNEE 3 VIEW LEFT   No orders of the defined types were placed in this encounter.     Procedures: No procedures performed   Clinical Data: No additional findings.   Subjective: Chief Complaint  Patient presents with   Left Hip - Follow-up    Left total hip arthroplasty 09/20/2021   Left Knee - Pain    HPI patient is a pleasant 66 year old gentleman who comes in today 6 weeks status post left total hip replacement 09/20/2021.  He has been doing well.  He is walking without the assistance of a cane or walker.  The other issue he brings up today is slight pain to the left knee.  He notes this is actually somewhat improved following his left hip replacement surgery.  The pain he  has usually occurs when he is in a seated position.  He takes occasional medication for this.  Review of Systems as detailed in HPI.  All others reviewed and are negative.   Objective: Vital Signs: There were no vitals taken for this visit.  Physical Exam well-developed well-nourished gentleman in no acute distress but alert and oriented x3.  Ortho Exam left hip exam reveals a painless logroll and painless hip flexion.  Left knee exam shows range of motion 0 to 120 degrees.  Mild medial lateral joint line tenderness.  Mild patellofemoral crepitus.  He is neurovascular intact distally.  Specialty Comments:  No specialty comments available.  Imaging: XR Pelvis 1-2 Views  Result Date: 11/23/2021 Well-seated prosthesis without complication  XR KNEE 3 VIEW LEFT  Result Date: 11/23/2021 X-rays demonstrate moderate tricompartmental degenerative changes with evidence of chondrocalcinosis of the medial lateral compartments    PMFS History: Patient Active Problem List   Diagnosis Date Noted   Degenerative joint disease of right hip 09/20/2021   Status post total replacement of left hip 09/20/2021   Unilateral primary osteoarthritis, left hip    Hypertension associated with diabetes ([place]) 03/04/2021   Dyslipidemia associated with type 2 diabetes mellitus (  [place]) 01/30/2020   PVD (peripheral vascular disease) ([place]) 01/30/2020   Chronic venous insufficiency 01/30/2020   Benign prostatic hyperplasia 01/30/2020   Tobacco use disorder, continuous 01/30/2020   Smoker 06/08/2013   Other and unspecified hyperlipidemia 06/08/2013   Gout 06/08/2013   Morbid obesity ([place]) 06/08/2013   Past Medical History:  Diagnosis Date   Allergy    seasonal allergies   Arthritis    Diabetes mellitus without complication (HCC)    type 2, non-insulin dependent   GERD (gastroesophageal reflux disease)    Gout    Hearing loss    dos not use hearing aids   Hyperlipidemia    Sleep apnea    Not on CPAP     Family History  Problem Relation Age of Onset   Cancer Mother    Ovarian cancer Mother    Stomach cancer Mother    Colon cancer Neg Hx    Esophageal cancer Neg Hx    Rectal cancer Neg Hx    Colon polyps Neg Hx     Past Surgical History:  Procedure Laterality Date   COLONOSCOPY     sev colon's    POLYPECTOMY     TOTAL HIP ARTHROPLASTY Left 09/20/2021   Procedure: LEFT TOTAL HIP ARTHROPLASTY ANTERIOR APPROACH;  Surgeon: Leandrew Koyanagi, MD;  Location: [facility];  Service: Orthopedics;  Laterality: Left;  3-C   Social History   Occupational History   Not on file  Tobacco Use   Smoking status: Some Days    Packs/day: 1.00    Years: 35.00    Total pack years: 35.00    Types: Cigarettes   Smokeless tobacco: Never   Tobacco comments:    1-2 cigs a day or more   Vaping Use   Vaping Use: Never used  Substance and Sexual Activity   Alcohol use: Yes    Alcohol/week: 12.0 standard drinks of alcohol    Types: 6 Glasses of wine, 6 Cans of beer per week   Drug use: No   Sexual activity: Not Currently

## 2021-12-01 ENCOUNTER — Telehealth: Payer: Self-pay | Admitting: *Deleted

## 2021-12-01 NOTE — Telephone Encounter (Signed)
Ortho bundle call attempted. No answer and left VM requesting call back.

## 2022-03-08 ENCOUNTER — Ambulatory Visit (INDEPENDENT_AMBULATORY_CARE_PROVIDER_SITE_OTHER): Payer: Medicare Other | Admitting: Emergency Medicine

## 2022-03-08 ENCOUNTER — Ambulatory Visit (INDEPENDENT_AMBULATORY_CARE_PROVIDER_SITE_OTHER): Payer: Medicare Other

## 2022-03-08 ENCOUNTER — Encounter: Payer: Self-pay | Admitting: Emergency Medicine

## 2022-03-08 VITALS — BP 130/74 | HR 56 | Temp 98.0°F | Ht 72.0 in | Wt 289.1 lb

## 2022-03-08 DIAGNOSIS — J988 Other specified respiratory disorders: Secondary | ICD-10-CM

## 2022-03-08 DIAGNOSIS — I152 Hypertension secondary to endocrine disorders: Secondary | ICD-10-CM | POA: Diagnosis not present

## 2022-03-08 DIAGNOSIS — Z122 Encounter for screening for malignant neoplasm of respiratory organs: Secondary | ICD-10-CM

## 2022-03-08 DIAGNOSIS — F17200 Nicotine dependence, unspecified, uncomplicated: Secondary | ICD-10-CM

## 2022-03-08 DIAGNOSIS — E1169 Type 2 diabetes mellitus with other specified complication: Secondary | ICD-10-CM

## 2022-03-08 DIAGNOSIS — E785 Hyperlipidemia, unspecified: Secondary | ICD-10-CM | POA: Diagnosis not present

## 2022-03-08 DIAGNOSIS — B9789 Other viral agents as the cause of diseases classified elsewhere: Secondary | ICD-10-CM

## 2022-03-08 DIAGNOSIS — E1159 Type 2 diabetes mellitus with other circulatory complications: Secondary | ICD-10-CM

## 2022-03-08 DIAGNOSIS — N400 Enlarged prostate without lower urinary tract symptoms: Secondary | ICD-10-CM

## 2022-03-08 LAB — COMPREHENSIVE METABOLIC PANEL
ALT: 17 U/L (ref 0–53)
AST: 21 U/L (ref 0–37)
Albumin: 4 g/dL (ref 3.5–5.2)
Alkaline Phosphatase: 59 U/L (ref 39–117)
BUN: 14 mg/dL (ref 6–23)
CO2: 27 mEq/L (ref 19–32)
Calcium: 10 mg/dL (ref 8.4–10.5)
Chloride: 99 mEq/L (ref 96–112)
Creatinine, Ser: 0.93 mg/dL (ref 0.40–1.50)
GFR: 84.24 mL/min (ref >60.00)
Glucose, Bld: 95 mg/dL (ref 70–99)
Potassium: 5.2 mEq/L — ABNORMAL HIGH (ref 3.5–5.1)
Sodium: 135 mEq/L (ref 135–145)
Total Bilirubin: 0.7 mg/dL (ref 0.2–1.2)
Total Protein: 7.6 g/dL (ref 6.0–8.3)

## 2022-03-08 LAB — CBC WITH DIFFERENTIAL/PLATELET
Basophils Absolute: 0 10*3/uL (ref 0.0–0.1)
Basophils Relative: 0.6 % (ref 0.0–3.0)
Eosinophils Absolute: 0.3 10*3/uL (ref 0.0–0.7)
Eosinophils Relative: 3.7 % (ref 0.0–5.0)
HCT: 40.7 % (ref 39.0–52.0)
Hemoglobin: 14.1 g/dL (ref 13.0–17.0)
Lymphocytes Relative: 23.7 % (ref 12.0–46.0)
Lymphs Abs: 1.9 10*3/uL (ref 0.7–4.0)
MCHC: 34.5 g/dL (ref 30.0–36.0)
MCV: 94.2 fl (ref 78.0–100.0)
Monocytes Absolute: 0.5 10*3/uL (ref 0.1–1.0)
Monocytes Relative: 6 % (ref 3.0–12.0)
Neutro Abs: 5.2 10*3/uL (ref 1.4–7.7)
Neutrophils Relative %: 66 % (ref 43.0–77.0)
Platelets: 327 10*3/uL (ref 150.0–400.0)
RBC: 4.32 Mil/uL (ref 4.22–5.81)
RDW: 14.1 % (ref 11.5–15.5)
WBC: 7.9 10*3/uL (ref 4.0–10.5)

## 2022-03-08 LAB — LIPID PANEL
Cholesterol: 139 mg/dL (ref 0–200)
HDL: 39.5 mg/dL (ref >39.00)
LDL Cholesterol: 73 mg/dL (ref 0–99)
NonHDL: 99.8
Total CHOL/HDL Ratio: 4
Triglycerides: 134 mg/dL (ref 0.0–149.0)
VLDL: 26.8 mg/dL (ref 0.0–40.0)

## 2022-03-08 LAB — POCT GLYCOSYLATED HEMOGLOBIN (HGB A1C): Hemoglobin A1C: 6.1 % — AB (ref 4.0–5.6)

## 2022-03-08 MED ORDER — METFORMIN HCL 500 MG PO TABS
500.0000 mg | ORAL_TABLET | Freq: Two times a day (BID) | ORAL | 3 refills | Status: DC
Start: 2022-03-08 — End: 2023-01-30

## 2022-03-08 MED ORDER — ATORVASTATIN CALCIUM 20 MG PO TABS: 20.0000 mg | ORAL_TABLET | Freq: Every day | ORAL | 3 refills | Status: DC

## 2022-03-08 NOTE — Patient Instructions (Signed)
Health Maintenance After Age 68 After age 68, you are at a higher risk for certain long-term diseases and infections as well as injuries from falls. Falls are a major cause of broken bones and head injuries in people who are older than age 68. Getting regular preventive care can help to keep you healthy and well. Preventive care includes getting regular testing and making lifestyle changes as recommended by your health care provider. Talk with your health care provider about: Which screenings and tests you should have. A screening is a test that checks for a disease when you have no symptoms. A diet and exercise plan that is right for you. What should I know about screenings and tests to prevent falls? Screening and testing are the best ways to find a health problem early. Early diagnosis and treatment give you the best chance of managing medical conditions that are common after age 68. Certain conditions and lifestyle choices may make you more likely to have a fall. Your health care provider may recommend: Regular vision checks. Poor vision and conditions such as cataracts can make you more likely to have a fall. If you wear glasses, make sure to get your prescription updated if your vision changes. Medicine review. Work with your health care provider to regularly review all of the medicines you are taking, including over-the-counter medicines. Ask your health care provider about any side effects that may make you more likely to have a fall. Tell your health care provider if any medicines that you take make you feel dizzy or sleepy. Strength and balance checks. Your health care provider may recommend certain tests to check your strength and balance while standing, walking, or changing positions. Foot health exam. Foot pain and numbness, as well as not wearing proper footwear, can make you more likely to have a fall. Screenings, including: Osteoporosis screening. Osteoporosis is a condition that causes  the bones to get weaker and break more easily. Blood pressure screening. Blood pressure changes and medicines to control blood pressure can make you feel dizzy. Depression screening. You may be more likely to have a fall if you have a fear of falling, feel depressed, or feel unable to do activities that you used to do. Alcohol use screening. Using too much alcohol can affect your balance and may make you more likely to have a fall. Follow these instructions at home: Lifestyle Do not drink alcohol if: Your health care provider tells you not to drink. If you drink alcohol: Limit how much you have to: 0-1 drink a day for women. 0-2 drinks a day for men. Know how much alcohol is in your drink. In the [place] one drink equals one 12 oz bottle of beer (355 mL), one 5 oz glass of wine (148 mL), or one 1 oz glass of hard liquor (44 mL). Do not use any products that contain nicotine or tobacco. These products include cigarettes, chewing tobacco, and vaping devices, such as e-cigarettes. If you need help quitting, ask your health care provider. Activity  Follow a regular exercise program to stay fit. This will help you maintain your balance. Ask your health care provider what types of exercise are appropriate for you. If you need a cane or walker, use it as recommended by your health care provider. Wear supportive shoes that have nonskid soles. Safety  Remove any tripping hazards, such as rugs, cords, and clutter. Install safety equipment such as grab bars in bathrooms and safety rails on stairs. Keep rooms and walkways   well-lit. General instructions Talk with your health care provider about your risks for falling. Tell your health care provider if: You fall. Be sure to tell your health care provider about all falls, even ones that seem minor. You feel dizzy, tiredness (fatigue), or off-balance. Take over-the-counter and prescription medicines only as told by your health care provider. These include  supplements. Eat a healthy diet and maintain a healthy weight. A healthy diet includes low-fat dairy products, low-fat (lean) meats, and fiber from whole grains, beans, and lots of fruits and vegetables. Stay current with your vaccines. Schedule regular health, dental, and eye exams. Summary Having a healthy lifestyle and getting preventive care can help to protect your health and wellness after age 68. Screening and testing are the best way to find a health problem early and help you avoid having a fall. Early diagnosis and treatment give you the best chance for managing medical conditions that are more common for people who are older than age 68. Falls are a major cause of broken bones and head injuries in people who are older than age 68. Take precautions to prevent a fall at home. Work with your health care provider to learn what changes you can make to improve your health and wellness and to prevent falls. This information is not intended to replace advice given to you by your health care provider. Make sure you discuss any questions you have with your health care provider. Document Revised: 10/12/2020 Document Reviewed: 10/12/2020 Elsevier Patient Education  2023 Elsevier Inc.  

## 2022-03-08 NOTE — Assessment & Plan Note (Signed)
Running its course and slowly getting better.  No complications.  No red flag signs or symptoms. Chest x-ray done today.

## 2022-03-08 NOTE — Assessment & Plan Note (Signed)
Cardiovascular and cancer risks associated with smoking discussed.  Smoking cessation advice given. Lung cancer screening recommended.

## 2022-03-08 NOTE — Assessment & Plan Note (Signed)
Stable.  Diet and nutrition discussed. Continue atorvastatin 20 mg daily. The 10-year ASCVD risk score (Arnett DK, et al., 2019) is: 37%   Values used to calculate the score:     Age: 66 years     Sex: Male     Is Non-Hispanic African American: No     Diabetic: Yes     Tobacco smoker: Yes     Systolic Blood Pressure: 782 mmHg     Is BP treated: Yes     HDL Cholesterol: 48.6 mg/dL     Total Cholesterol: 161 mg/dL

## 2022-03-08 NOTE — Progress Notes (Signed)
Gabriel Fernandez 66 y.o.   Chief Complaint  Patient presents with   Follow-up    74mth f/u appt , no concerns     HISTORY OF PRESENT ILLNESS: This is a 66y.o. male here for 652-monthollow-up of multiple chronic medical conditions. Flulike symptoms that started 2 weeks ago and slowly getting better.  Tested negative for COVID. Earlier this year had left hip replacement surgery. Last orthopedic office visit assessment and plan as follows: Assessment & Plan: Visit Diagnoses:  1. Status post total hip replacement, left   2. Chronic pain of left knee       Plan: Impression is 6 weeks status post left total hip replacement and chronic, but intermittent right knee pain.  In regards to his hip, he is doing well.  He will continue to advance with activity as tolerated.  Dental prophylaxis reinforced.  Follow-up in 6 weeks for recheck.  In regards to the knee, we discussed that he does have some degree of arthritis and chondrocalcinosis seen on x-ray but that some of this pain could also be referred from his hip as he does note that this is slightly improved since surgery.  He tells me that his symptoms were not bad enough to do anything about at this time.  He [place] they worsen.  Call with concerns or questions.   Follow-Up Instructions: Return in about 6 weeks (around 01/04/2022).  Lab Results  Component Value Date   HGBA1C 6.0 (H) 07/27/2021     HPI   Prior to Admission medications   Medication Sig Start Date End Date Taking? Authorizing Provider  amoxicillin (AMOXIL) 500 MG capsule Take 4 pills one hour prior to dental work 11/23/21  Yes StDwana Melena, PA-C  lisinopril (ZESTRIL) 20 MG tablet Take 1 tablet (20 mg total) by mouth daily. 09/02/21  Yes SaHorald PollenMD  metFORMIN (GLUCOPHAGE) 500 MG tablet Take 1 tablet (500 mg total) by mouth 2 (two) times daily with a meal. 03/04/21  Yes Raelin Pixler, MiInes BloomerMD  tamsulosin (FLOMAX) 0.4 MG CAPS  capsule Take 1 capsule (0.4 mg total) by mouth daily after breakfast. 09/02/21  Yes Joci Dress, MiInes BloomerMD  atorvastatin (LIPITOR) 20 MG tablet Take 1 tablet (20 mg total) by mouth daily. 09/02/21 12/01/21  SaHorald PollenMD    No Known Allergies  Patient Active Problem List   Diagnosis Date Noted   Degenerative joint disease of right hip 09/20/2021   Status post total replacement of left hip 09/20/2021   Unilateral primary osteoarthritis, left hip    Hypertension associated with diabetes ([facility]09/29/2022   Dyslipidemia associated with type 2 diabetes mellitus ([facility]/26/2021   PVD (peripheral vascular disease) ([facility]08/26/2021   Chronic venous insufficiency 01/30/2020   Benign prostatic hyperplasia 01/30/2020   Tobacco use disorder, continuous 01/30/2020   Smoker 06/08/2013   Other and unspecified hyperlipidemia 06/08/2013   Gout 06/08/2013   Morbid obesity ([facility]01/08/2013    Past Medical History:  Diagnosis Date   Allergy    seasonal allergies   Arthritis    Diabetes mellitus without complication (HCC)    type 2, non-insulin dependent   GERD (gastroesophageal reflux disease)    Gout    Hearing loss    dos not use hearing aids   Hyperlipidemia    Sleep apnea    Not on CPAP    Past Surgical History:  Procedure Laterality Date   COLONOSCOPY  sev colon's    POLYPECTOMY     TOTAL HIP ARTHROPLASTY Left 09/20/2021   Procedure: LEFT TOTAL HIP ARTHROPLASTY ANTERIOR APPROACH;  Surgeon: Leandrew Koyanagi, MD;  Location: [facility];  Service: Orthopedics;  Laterality: Left;  3-C    Social History   Socioeconomic History   Marital status: Single    Spouse name: Not on file   Number of children: Not on file   Years of education: Not on file   Highest education level: Not on file  Occupational History   Not on file  Tobacco Use   Smoking status: Some Days    Packs/day: 1.00    Years: 35.00    Total pack years: 35.00    Types: Cigarettes   Smokeless tobacco: Never    Tobacco comments:    1-2 cigs a day or more   Vaping Use   Vaping Use: Never used  Substance and Sexual Activity   Alcohol use: Yes    Alcohol/week: 12.0 standard drinks of alcohol    Types: 6 Glasses of wine, 6 Cans of beer per week   Drug use: No   Sexual activity: Not Currently  Other Topics Concern   Not on file  Social History Narrative   Not on file   Social Determinants of Health   Financial Resource Strain: Low Risk  (11/05/2021)   Overall Financial Resource Strain (CARDIA)    Difficulty of Paying Living Expenses: Not hard at all  Food Insecurity: No Food Insecurity (11/05/2021)   Hunger Vital Sign    Worried About Running Out of Food in the Last Year: Never true    Ran Out of Food in the Last Year: Never true  Transportation Needs: No Transportation Needs (11/05/2021)   PRAPARE - Hydrologist (Medical): No    Lack of Transportation (Non-Medical): No  Physical Activity: Sufficiently Active (11/05/2021)   Exercise Vital Sign    Days of Exercise per Week: 7 days    Minutes of Exercise per Session: 30 min  Stress: No Stress Concern Present (11/05/2021)   [place]    Feeling of Stress : Not at all  Social Connections: Not on file  Intimate Partner Violence: Not At Risk (11/05/2021)   Humiliation, Afraid, Rape, and Kick questionnaire    Fear of Current or Ex-Partner: No    Emotionally Abused: No    Physically Abused: No    Sexually Abused: No    Family History  Problem Relation Age of Onset   Cancer Mother    Ovarian cancer Mother    Stomach cancer Mother    Colon cancer Neg Hx    Esophageal cancer Neg Hx    Rectal cancer Neg Hx    Colon polyps Neg Hx      Review of Systems  Constitutional: Negative.  Negative for chills and fever.  HENT:  Negative for congestion and sore throat.   Respiratory: Negative.  Negative for cough and shortness of breath.   Cardiovascular:  Negative.  Negative for chest pain and palpitations.  Gastrointestinal:  Negative for abdominal pain, diarrhea, nausea and vomiting.  Genitourinary: Negative.   Skin: Negative.  Negative for rash.  Neurological: Negative.  Negative for dizziness and headaches.  All other systems reviewed and are negative.   Today's Vitals   03/08/22 1258  BP: 130/74  Pulse: (!) 56  Temp: 98 F (36.7 C)  TempSrc: Oral  SpO2: 92%  Weight: 289 lb 2 oz (131.1 kg)  Height: 6' (1.829 m)   Body mass index is 39.21 kg/m. Wt Readings from Last 3 Encounters:  03/08/22 289 lb 2 oz (131.1 kg)  09/15/21 289 lb (131.1 kg)  09/10/21 292 lb 9.6 oz (132.7 kg)    Physical Exam Vitals reviewed.  Constitutional:      Appearance: Normal appearance.  HENT:     Head: Normocephalic.  Eyes:     Extraocular Movements: Extraocular movements intact.  Cardiovascular:     Rate and Rhythm: Normal rate.  Pulmonary:     Effort: Pulmonary effort is normal.  Skin:    General: Skin is warm and dry.  Neurological:     General: No focal deficit present.     Mental Status: He is alert and oriented to person, place, and time.  Psychiatric:        Mood and Affect: Mood normal.        Behavior: Behavior normal.    DG Chest 2 View  Result Date: 03/08/2022 CLINICAL DATA:  Flu like symptoms.  Respiratory viral infection. EXAM: CHEST - 2 VIEW COMPARISON:  Radiographs 04/29/2010 and 03/20/2010. FINDINGS: The heart size and mediastinal contours are normal. The lungs are clear. There is no pleural effusion or pneumothorax. No acute osseous findings are identified. Multilevel thoracic spondylosis. IMPRESSION: No active cardiopulmonary disease. Electronically Signed   By: Richardean Sale M.D.   On: 03/08/2022 13:39   Results for orders placed or performed in visit on 03/08/22 (from the past 24 hour(s))  POCT HgB A1C     Status: Abnormal   Collection Time: 03/08/22  1:19 PM  Result Value Ref Range   Hemoglobin A1C 6.1 (A) 4.0 -  5.6 %   HbA1c POC (<> result, manual entry)     HbA1c, POC (prediabetic range)     HbA1c, POC (controlled diabetic range)       ASSESSMENT & PLAN: A total of 47 minutes was spent with the patient and counseling/coordination of care regarding preparing for this visit, review of most recent office visit notes, review of most recent orthopedic office visit note, review of multiple chronic medical problems and their management, review of all medications, review of most recent blood work results including interpretation of today's hemoglobin A1c, education on nutrition, need for lung cancer screening, smoking cessation advised, prognosis, documentation, and need for follow-up.  Problem List Items Addressed This Visit       Cardiovascular and Mediastinum   Hypertension associated with diabetes ([place]) - Primary    Well-controlled hypertension. Continue lisinopril 20 mg daily. Well-controlled diabetes with hemoglobin A1c of 6.1. Continue metformin 500 mg twice a day. Cardiovascular risks associated with hypertension and diabetes discussed. Diet and nutrition discussed.      Relevant Medications   metFORMIN (GLUCOPHAGE) 500 MG tablet   atorvastatin (LIPITOR) 20 MG tablet   Other Relevant Orders   POCT HgB A1C (Completed)   CBC with Differential/Platelet   Comprehensive metabolic panel     Respiratory   Viral respiratory infection    Running its course and slowly getting better.  No complications.  No red flag signs or symptoms. Chest x-ray done today.      Relevant Orders   DG Chest 2 View (Completed)     Endocrine   Dyslipidemia associated with type 2 diabetes mellitus ([place])    Stable.  Diet and nutrition discussed. Continue atorvastatin 20 mg daily. The 10-year ASCVD risk score (Arnett DK, et al., 2019)  is: 37%   Values used to calculate the score:     Age: 21 years     Sex: Male     Is Non-Hispanic African American: No     Diabetic: Yes     Tobacco smoker: Yes     Systolic  Blood Pressure: 130 mmHg     Is BP treated: Yes     HDL Cholesterol: 48.6 mg/dL     Total Cholesterol: 161 mg/dL       Relevant Medications   metFORMIN (GLUCOPHAGE) 500 MG tablet   atorvastatin (LIPITOR) 20 MG tablet   Other Relevant Orders   Lipid panel     Genitourinary   Benign prostatic hyperplasia (Chronic)    Stable continue Flomax 0.4 mg daily.        Other   Current smoker    Cardiovascular and cancer risks associated with smoking discussed.  Smoking cessation advice given. Lung cancer screening recommended.      Relevant Orders   CT CHEST LUNG CA SCREEN LOW DOSE W/O CM   Other Visit Diagnoses     Screening for lung cancer       Relevant Orders   CT CHEST LUNG CA SCREEN LOW DOSE W/O CM      Patient Instructions  Health Maintenance After Age 61 After age 54, you are at a higher risk for certain long-term diseases and infections as well as injuries from falls. Falls are a major cause of broken bones and head injuries in people who are older than age 89. Getting regular preventive care can help to keep you healthy and well. Preventive care includes getting regular testing and making lifestyle changes as recommended by your health care provider. Talk with your health care provider about: Which screenings and tests you should have. A screening is a test that checks for a disease when you have no symptoms. A diet and exercise plan that is right for you. What should I know about screenings and tests to prevent falls? Screening and testing are the best ways to find a health problem early. Early diagnosis and treatment give you the best chance of managing medical conditions that are common after age 40. Certain conditions and lifestyle choices may make you more likely to have a fall. Your health care provider may recommend: Regular vision checks. Poor vision and conditions such as cataracts can make you more likely to have a fall. If you wear glasses, make sure to get your  prescription updated if your vision changes. Medicine review. Work with your health care provider to regularly review all of the medicines you are taking, including over-the-counter medicines. Ask your health care provider about any side effects that may make you more likely to have a fall. Tell your health care provider if any medicines that you take make you feel dizzy or sleepy. Strength and balance checks. Your health care provider may recommend certain tests to check your strength and balance while standing, walking, or changing positions. Foot health exam. Foot pain and numbness, as well as not wearing proper footwear, can make you more likely to have a fall. Screenings, including: Osteoporosis screening. Osteoporosis is a condition that causes the bones to get weaker and break more easily. Blood pressure screening. Blood pressure changes and medicines to control blood pressure can make you feel dizzy. Depression screening. You may be more likely to have a fall if you have a fear of falling, feel depressed, or feel unable to do activities that you used  to do. Alcohol use screening. Using too much alcohol can affect your balance and may make you more likely to have a fall. Follow these instructions at home: Lifestyle Do not drink alcohol if: Your health care provider tells you not to drink. If you drink alcohol: Limit how much you have to: 0-1 drink a day for women. 0-2 drinks a day for men. Know how much alcohol is in your drink. In the [place] one drink equals one 12 oz bottle of beer (355 mL), one 5 oz glass of wine (148 mL), or one 1 oz glass of hard liquor (44 mL). Do not use any products that contain nicotine or tobacco. These products include cigarettes, chewing tobacco, and vaping devices, such as e-cigarettes. If you need help quitting, ask your health care provider. Activity  Follow a regular exercise program to stay fit. This will help you maintain your balance. Ask your health  care provider what types of exercise are appropriate for you. If you need a cane or walker, use it as recommended by your health care provider. Wear supportive shoes that have nonskid soles. Safety  Remove any tripping hazards, such as rugs, cords, and clutter. Install safety equipment such as grab bars in bathrooms and safety rails on stairs. Keep rooms and walkways well-lit. General instructions Talk with your health care provider about your risks for falling. Tell your health care provider if: You fall. Be sure to tell your health care provider about all falls, even ones that seem minor. You feel dizzy, tiredness (fatigue), or off-balance. Take over-the-counter and prescription medicines only as told by your health care provider. These include supplements. Eat a healthy diet and maintain a healthy weight. A healthy diet includes low-fat dairy products, low-fat (lean) meats, and fiber from whole grains, beans, and lots of fruits and vegetables. Stay current with your vaccines. Schedule regular health, dental, and eye exams. Summary Having a healthy lifestyle and getting preventive care can help to protect your health and wellness after age 90. Screening and testing are the best way to find a health problem early and help you avoid having a fall. Early diagnosis and treatment give you the best chance for managing medical conditions that are more common for people who are older than age 56. Falls are a major cause of broken bones and head injuries in people who are older than age 68. Take precautions to prevent a fall at home. Work with your health care provider to learn what changes you can make to improve your health and wellness and to prevent falls. This information is not intended to replace advice given to you by your health care provider. Make sure you discuss any questions you have with your health care provider. Document Revised: 10/12/2020 Document Reviewed: 10/12/2020 Elsevier  Patient Education  [place], MD [facility]

## 2022-03-08 NOTE — Assessment & Plan Note (Signed)
Stable continue Flomax 0.4 mg daily.

## 2022-03-08 NOTE — Assessment & Plan Note (Signed)
Well-controlled hypertension. Continue lisinopril 20 mg daily. Well-controlled diabetes with hemoglobin A1c of 6.1. Continue metformin 500 mg twice a day. Cardiovascular risks associated with hypertension and diabetes discussed. Diet and nutrition discussed.

## 2022-04-08 ENCOUNTER — Ambulatory Visit
Admission: RE | Admit: 2022-04-08 | Discharge: 2022-04-08 | Disposition: A | Discharge status: Home / Self Care | Payer: Medicare Other | Source: Ambulatory Visit | Attending: Emergency Medicine | Admitting: Emergency Medicine

## 2022-04-08 DIAGNOSIS — F17200 Nicotine dependence, unspecified, uncomplicated: Secondary | ICD-10-CM

## 2022-04-08 DIAGNOSIS — Z122 Encounter for screening for malignant neoplasm of respiratory organs: Secondary | ICD-10-CM

## 2022-04-08 DIAGNOSIS — F17210 Nicotine dependence, cigarettes, uncomplicated: Secondary | ICD-10-CM | POA: Diagnosis not present

## 2022-09-07 ENCOUNTER — Ambulatory Visit: Payer: Medicare Other | Admitting: Emergency Medicine

## 2022-10-03 ENCOUNTER — Ambulatory Visit (INDEPENDENT_AMBULATORY_CARE_PROVIDER_SITE_OTHER): Payer: Medicare Other | Admitting: Emergency Medicine

## 2022-10-03 ENCOUNTER — Encounter: Payer: Self-pay | Admitting: Emergency Medicine

## 2022-10-03 VITALS — BP 138/84 | HR 63 | Temp 98.0°F | Ht 72.0 in | Wt 292.5 lb

## 2022-10-03 DIAGNOSIS — E1159 Type 2 diabetes mellitus with other circulatory complications: Secondary | ICD-10-CM

## 2022-10-03 DIAGNOSIS — I872 Venous insufficiency (chronic) (peripheral): Secondary | ICD-10-CM | POA: Diagnosis not present

## 2022-10-03 DIAGNOSIS — N401 Enlarged prostate with lower urinary tract symptoms: Secondary | ICD-10-CM | POA: Diagnosis not present

## 2022-10-03 DIAGNOSIS — R350 Frequency of micturition: Secondary | ICD-10-CM | POA: Diagnosis not present

## 2022-10-03 DIAGNOSIS — Z7984 Long term (current) use of oral hypoglycemic drugs: Secondary | ICD-10-CM | POA: Diagnosis not present

## 2022-10-03 DIAGNOSIS — F17200 Nicotine dependence, unspecified, uncomplicated: Secondary | ICD-10-CM | POA: Diagnosis not present

## 2022-10-03 DIAGNOSIS — Z6839 Body mass index (BMI) 39.0-39.9, adult: Secondary | ICD-10-CM

## 2022-10-03 DIAGNOSIS — E785 Hyperlipidemia, unspecified: Secondary | ICD-10-CM | POA: Diagnosis not present

## 2022-10-03 DIAGNOSIS — I152 Hypertension secondary to endocrine disorders: Secondary | ICD-10-CM | POA: Diagnosis not present

## 2022-10-03 DIAGNOSIS — E1169 Type 2 diabetes mellitus with other specified complication: Secondary | ICD-10-CM

## 2022-10-03 DIAGNOSIS — F17209 Nicotine dependence, unspecified, with unspecified nicotine-induced disorders: Secondary | ICD-10-CM | POA: Diagnosis not present

## 2022-10-03 LAB — COMPREHENSIVE METABOLIC PANEL
ALT: 18 U/L (ref 0–53)
AST: 18 U/L (ref 0–37)
Albumin: 4.2 g/dL (ref 3.5–5.2)
Alkaline Phosphatase: 60 U/L (ref 39–117)
BUN: 16 mg/dL (ref 6–23)
CO2: 27 mEq/L (ref 19–32)
Calcium: 9.4 mg/dL (ref 8.4–10.5)
Chloride: 103 mEq/L (ref 96–112)
Creatinine, Ser: 1 mg/dL (ref 0.40–1.50)
GFR: 76.9 mL/min (ref >60.00)
Glucose, Bld: 99 mg/dL (ref 70–99)
Potassium: 5 mEq/L (ref 3.5–5.1)
Sodium: 137 mEq/L (ref 135–145)
Total Bilirubin: 0.5 mg/dL (ref 0.2–1.2)
Total Protein: 7 g/dL (ref 6.0–8.3)

## 2022-10-03 LAB — LIPID PANEL
Cholesterol: 158 mg/dL (ref 0–200)
HDL: 51.1 mg/dL (ref >39.00)
LDL Cholesterol: 75 mg/dL (ref 0–99)
NonHDL: 107.1
Total CHOL/HDL Ratio: 3
Triglycerides: 161 mg/dL — ABNORMAL HIGH (ref 0.0–149.0)
VLDL: 32.2 mg/dL (ref 0.0–40.0)

## 2022-10-03 LAB — CBC WITH DIFFERENTIAL/PLATELET
Basophils Absolute: 0 10*3/uL (ref 0.0–0.1)
Basophils Relative: 0.6 % (ref 0.0–3.0)
Eosinophils Absolute: 0.1 10*3/uL (ref 0.0–0.7)
Eosinophils Relative: 1.7 % (ref 0.0–5.0)
HCT: 43.4 % (ref 39.0–52.0)
Hemoglobin: 14.7 g/dL (ref 13.0–17.0)
Lymphocytes Relative: 26.7 % (ref 12.0–46.0)
Lymphs Abs: 1.9 10*3/uL (ref 0.7–4.0)
MCHC: 33.9 g/dL (ref 30.0–36.0)
MCV: 96 fl (ref 78.0–100.0)
Monocytes Absolute: 0.4 10*3/uL (ref 0.1–1.0)
Monocytes Relative: 5.3 % (ref 3.0–12.0)
Neutro Abs: 4.7 10*3/uL (ref 1.4–7.7)
Neutrophils Relative %: 65.7 % (ref 43.0–77.0)
Platelets: 251 10*3/uL (ref 150.0–400.0)
RBC: 4.52 Mil/uL (ref 4.22–5.81)
RDW: 14.9 % (ref 11.5–15.5)
WBC: 7.1 10*3/uL (ref 4.0–10.5)

## 2022-10-03 LAB — PSA: PSA: 0.21 ng/mL (ref 0.10–4.00)

## 2022-10-03 LAB — HEMOGLOBIN A1C: Hgb A1c MFr Bld: 6.3 % (ref 4.6–6.5)

## 2022-10-03 NOTE — Assessment & Plan Note (Signed)
Stable with mild edema Recommend leg elevation and compression socks Low-salt diet recommended

## 2022-10-03 NOTE — Assessment & Plan Note (Signed)
Continues to have lower urinary tract symptoms despite Flomax 0.4 mg Recommend urology evaluation Referral placed today.

## 2022-10-03 NOTE — Assessment & Plan Note (Signed)
Elevated blood pressure reading in the office but normal readings at home Continue lisinopril 20 mg daily Cardiovascular risk associated with diabetes and hypertension discussed Diet and nutrition discussed Well-controlled diabetes Continue metformin 500 mg twice a day

## 2022-10-03 NOTE — Assessment & Plan Note (Signed)
Chronic stable condition Lipid profile done today Diet and nutrition discussed Continue atorvastatin 20 mg daily 

## 2022-10-03 NOTE — Assessment & Plan Note (Signed)
Diet and nutrition discussed.  Advised to decrease amount of daily carbohydrate intake and daily calories and increase amount of plant based protein in his diet 

## 2022-10-03 NOTE — Patient Instructions (Signed)
Health Maintenance After Age 69 After age 69, you are at a higher risk for certain long-term diseases and infections as well as injuries from falls. Falls are a major cause of broken bones and head injuries in people who are older than age 69. Getting regular preventive care can help to keep you healthy and well. Preventive care includes getting regular testing and making lifestyle changes as recommended by your health care provider. Talk with your health care provider about: Which screenings and tests you should have. A screening is a test that checks for a disease when you have no symptoms. A diet and exercise plan that is right for you. What should I know about screenings and tests to prevent falls? Screening and testing are the best ways to find a health problem early. Early diagnosis and treatment give you the best chance of managing medical conditions that are common after age 69. Certain conditions and lifestyle choices may make you more likely to have a fall. Your health care provider may recommend: Regular vision checks. Poor vision and conditions such as cataracts can make you more likely to have a fall. If you wear glasses, make sure to get your prescription updated if your vision changes. Medicine review. Work with your health care provider to regularly review all of the medicines you are taking, including over-the-counter medicines. Ask your health care provider about any side effects that may make you more likely to have a fall. Tell your health care provider if any medicines that you take make you feel dizzy or sleepy. Strength and balance checks. Your health care provider may recommend certain tests to check your strength and balance while standing, walking, or changing positions. Foot health exam. Foot pain and numbness, as well as not wearing proper footwear, can make you more likely to have a fall. Screenings, including: Osteoporosis screening. Osteoporosis is a condition that causes  the bones to get weaker and break more easily. Blood pressure screening. Blood pressure changes and medicines to control blood pressure can make you feel dizzy. Depression screening. You may be more likely to have a fall if you have a fear of falling, feel depressed, or feel unable to do activities that you used to do. Alcohol use screening. Using too much alcohol can affect your balance and may make you more likely to have a fall. Follow these instructions at home: Lifestyle Do not drink alcohol if: Your health care provider tells you not to drink. If you drink alcohol: Limit how much you have to: 0-1 drink a day for women. 0-2 drinks a day for men. Know how much alcohol is in your drink. In the [place] one drink equals one 12 oz bottle of beer (355 mL), one 5 oz glass of wine (148 mL), or one 1 oz glass of hard liquor (44 mL). Do not use any products that contain nicotine or tobacco. These products include cigarettes, chewing tobacco, and vaping devices, such as e-cigarettes. If you need help quitting, ask your health care provider. Activity  Follow a regular exercise program to stay fit. This will help you maintain your balance. Ask your health care provider what types of exercise are appropriate for you. If you need a cane or walker, use it as recommended by your health care provider. Wear supportive shoes that have nonskid soles. Safety  Remove any tripping hazards, such as rugs, cords, and clutter. Install safety equipment such as grab bars in bathrooms and safety rails on stairs. Keep rooms and walkways   well-lit. General instructions Talk with your health care provider about your risks for falling. Tell your health care provider if: You fall. Be sure to tell your health care provider about all falls, even ones that seem minor. You feel dizzy, tiredness (fatigue), or off-balance. Take over-the-counter and prescription medicines only as told by your health care provider. These include  supplements. Eat a healthy diet and maintain a healthy weight. A healthy diet includes low-fat dairy products, low-fat (lean) meats, and fiber from whole grains, beans, and lots of fruits and vegetables. Stay current with your vaccines. Schedule regular health, dental, and eye exams. Summary Having a healthy lifestyle and getting preventive care can help to protect your health and wellness after age 69. Screening and testing are the best way to find a health problem early and help you avoid having a fall. Early diagnosis and treatment give you the best chance for managing medical conditions that are more common for people who are older than age 69. Falls are a major cause of broken bones and head injuries in people who are older than age 69. Take precautions to prevent a fall at home. Work with your health care provider to learn what changes you can make to improve your health and wellness and to prevent falls. This information is not intended to replace advice given to you by your health care provider. Make sure you discuss any questions you have with your health care provider. Document Revised: 10/12/2020 Document Reviewed: 10/12/2020 Elsevier Patient Education  2023 Elsevier Inc.  

## 2022-10-03 NOTE — Assessment & Plan Note (Signed)
Cardiovascular/cancer risk associated with smoking discussed. Smoking cessation advice given 

## 2022-10-03 NOTE — Progress Notes (Signed)
Gabriel Fernandez 67 y.o.   Chief Complaint  Patient presents with   Medical Management of Chronic Issues    f/u appt, no concerns     HISTORY OF PRESENT ILLNESS: This is a 67 y.o. male here for 89-month follow-up of hypertension, diabetes, and dyslipidemia History of BPH with lower urinary tract symptoms on Flomax, still having symptoms. Still smoking. No other complaints or medical concerns today. BP Readings from Last 3 Encounters:  03/08/22 130/74  09/21/21 (!) 131/96  09/15/21 120/70   Wt Readings from Last 3 Encounters:  10/03/22 292 lb 8 oz (132.7 kg)  03/08/22 289 lb 2 oz (131.1 kg)  09/15/21 289 lb (131.1 kg)     HPI   Prior to Admission medications   Medication Sig Start Date End Date Taking? Authorizing Provider  amoxicillin (AMOXIL) 500 MG capsule Take 4 pills one hour prior to dental work 11/23/21  Yes Cristie Hem, PA-C  atorvastatin (LIPITOR) 20 MG tablet Take 1 tablet (20 mg total) by mouth daily. 03/08/22 03/03/23 Yes Anyjah Roundtree, Eilleen Kempf, MD  lisinopril (ZESTRIL) 20 MG tablet Take 1 tablet (20 mg total) by mouth daily. 09/02/21  Yes Georgina Quint, MD  metFORMIN (GLUCOPHAGE) 500 MG tablet Take 1 tablet (500 mg total) by mouth 2 (two) times daily with a meal. 03/08/22  Yes Brok Stocking, Eilleen Kempf, MD  tamsulosin (FLOMAX) 0.4 MG CAPS capsule Take 1 capsule (0.4 mg total) by mouth daily after breakfast. 09/02/21  Yes Georgina Quint, MD    No Known Allergies  Patient Active Problem List   Diagnosis Date Noted   Degenerative joint disease of right hip 09/20/2021   Status post total replacement of left hip 09/20/2021   Unilateral primary osteoarthritis, left hip    Hypertension associated with diabetes (HCC) 03/04/2021   Dyslipidemia associated with type 2 diabetes mellitus (HCC) 01/30/2020   PVD (peripheral vascular disease) (HCC) 01/30/2020   Chronic venous insufficiency 01/30/2020   Benign prostatic hyperplasia 01/30/2020   Tobacco use  disorder, continuous 01/30/2020   Current smoker 06/08/2013   Other and unspecified hyperlipidemia 06/08/2013   Gout 06/08/2013   Morbid obesity (HCC) 06/08/2013    Past Medical History:  Diagnosis Date   Allergy    seasonal allergies   Arthritis    Diabetes mellitus without complication (HCC)    type 2, non-insulin dependent   GERD (gastroesophageal reflux disease)    Gout    Hearing loss    dos not use hearing aids   Hyperlipidemia    Sleep apnea    Not on CPAP    Past Surgical History:  Procedure Laterality Date   COLONOSCOPY     sev colon's    POLYPECTOMY     TOTAL HIP ARTHROPLASTY Left 09/20/2021   Procedure: LEFT TOTAL HIP ARTHROPLASTY ANTERIOR APPROACH;  Surgeon: Tarry Kos, MD;  Location: [facility] OR;  Service: Orthopedics;  Laterality: Left;  3-C    Social History   Socioeconomic History   Marital status: Single    Spouse name: Not on file   Number of children: Not on file   Years of education: Not on file   Highest education level: Not on file  Occupational History   Not on file  Tobacco Use   Smoking status: Some Days    Packs/day: 1.00    Years: 35.00    Additional pack years: 0.00    Total pack years: 35.00    Types: Cigarettes   Smokeless tobacco: Never  Tobacco comments:    1-2 cigs a day or more   Vaping Use   Vaping Use: Never used  Substance and Sexual Activity   Alcohol use: Yes    Alcohol/week: 12.0 standard drinks of alcohol    Types: 6 Glasses of wine, 6 Cans of beer per week   Drug use: No   Sexual activity: Not Currently  Other Topics Concern   Not on file  Social History Narrative   Not on file   Social Determinants of Health   Financial Resource Strain: Low Risk  (11/05/2021)   Overall Financial Resource Strain (CARDIA)    Difficulty of Paying Living Expenses: Not hard at all  Food Insecurity: No Food Insecurity (11/05/2021)   Hunger Vital Sign    Worried About Running Out of Food in the Last Year: Never true    Ran Out of  Food in the Last Year: Never true  Transportation Needs: No Transportation Needs (11/05/2021)   PRAPARE - Administrator, Civil Service (Medical): No    Lack of Transportation (Non-Medical): No  Physical Activity: Sufficiently Active (11/05/2021)   Exercise Vital Sign    Days of Exercise per Week: 7 days    Minutes of Exercise per Session: 30 min  Stress: No Stress Concern Present (11/05/2021)   [employer] of Occupational Health - Occupational Stress Questionnaire    Feeling of Stress : Not at all  Social Connections: Not on file  Intimate Partner Violence: Not At Risk (11/05/2021)   Humiliation, Afraid, Rape, and Kick questionnaire    Fear of Current or Ex-Partner: No    Emotionally Abused: No    Physically Abused: No    Sexually Abused: No    Family History  Problem Relation Age of Onset   Cancer Mother    Ovarian cancer Mother    Stomach cancer Mother    Colon cancer Neg Hx    Esophageal cancer Neg Hx    Rectal cancer Neg Hx    Colon polyps Neg Hx      Review of Systems  Constitutional: Negative.  Negative for chills and fever.  HENT: Negative.  Negative for congestion and sore throat.   Respiratory: Negative.  Negative for cough and shortness of breath.   Cardiovascular: Negative.  Negative for chest pain and palpitations.  Gastrointestinal:  Negative for abdominal pain, diarrhea, nausea and vomiting.  Genitourinary:  Positive for frequency and urgency. Negative for dysuria and hematuria.  Skin: Negative.  Negative for rash.  Neurological: Negative.  Negative for dizziness and headaches.  All other systems reviewed and are negative.   Vitals:   10/03/22 1507 10/03/22 1535  BP: (!) 142/80 138/84  Pulse: 63   Temp: 98 F (36.7 C)   SpO2: 98%      Physical Exam Vitals reviewed.  Constitutional:      Appearance: Normal appearance. He is obese.  HENT:     Head: Normocephalic.     Mouth/Throat:     Mouth: Mucous membranes are moist.     Pharynx:  Oropharynx is clear.  Eyes:     Extraocular Movements: Extraocular movements intact.     Pupils: Pupils are equal, round, and reactive to light.  Cardiovascular:     Rate and Rhythm: Normal rate and regular rhythm.     Pulses: Normal pulses.     Heart sounds: Normal heart sounds.  Pulmonary:     Effort: Pulmonary effort is normal.     Breath sounds: Normal  breath sounds.  Musculoskeletal:     Cervical back: No tenderness.     Comments: Chronic venous insufficiency skin changes on lower extremities  Lymphadenopathy:     Cervical: No cervical adenopathy.  Skin:    General: Skin is warm and dry.  Neurological:     Mental Status: He is alert and oriented to person, place, and time.  Psychiatric:        Mood and Affect: Mood normal.        Behavior: Behavior normal.      ASSESSMENT & PLAN: A total of 44 minutes was spent with the patient and counseling/coordination of care regarding preparing for this visit, review of most recent office visit notes, review of multiple chronic medical conditions and their management, review of all medications, education on nutrition, cardiovascular risks associated with hypertension and diabetes, smoking cessation advice, prognosis, documentation, and need for follow-up.   Problem List Items Addressed This Visit       Cardiovascular and Mediastinum   Chronic venous insufficiency    Stable with mild edema Recommend leg elevation and compression socks Low-salt diet recommended      Hypertension associated with diabetes (HCC) - Primary    Elevated blood pressure reading in the office but normal readings at home Continue lisinopril 20 mg daily Cardiovascular risk associated with diabetes and hypertension discussed Diet and nutrition discussed Well-controlled diabetes Continue metformin 500 mg twice a day      Relevant Orders   CBC with Differential/Platelet   Comprehensive metabolic panel   Hemoglobin A1c   Lipid panel     Endocrine    Dyslipidemia associated with type 2 diabetes mellitus (HCC)    Chronic stable condition Lipid profile done today Diet and nutrition discussed Continue atorvastatin 20 mg daily      Relevant Orders   Comprehensive metabolic panel   Hemoglobin A1c   Lipid panel     Genitourinary   Benign prostatic hyperplasia (Chronic)    Continues to have lower urinary tract symptoms despite Flomax 0.4 mg Recommend urology evaluation Referral placed today.      Relevant Orders   Ambulatory referral to Urology   PSA     Other   Current smoker   Morbid obesity (HCC)    Diet and nutrition discussed Advised to decrease amount of daily carbohydrate intake and daily calories and increase amount of plant-based protein in his diet      Tobacco use disorder, continuous    Cardiovascular/cancer risk associated with smoking discussed.  Smoking cessation advice given.      Patient Instructions  Health Maintenance After Age 40 After age 33, you are at a higher risk for certain long-term diseases and infections as well as injuries from falls. Falls are a major cause of broken bones and head injuries in people who are older than age 65. Getting regular preventive care can help to keep you healthy and well. Preventive care includes getting regular testing and making lifestyle changes as recommended by your health care provider. Talk with your health care provider about: Which screenings and tests you should have. A screening is a test that checks for a disease when you have no symptoms. A diet and exercise plan that is right for you. What should I know about screenings and tests to prevent falls? Screening and testing are the best ways to find a health problem early. Early diagnosis and treatment give you the best chance of managing medical conditions that are common after age 70.  Certain conditions and lifestyle choices may make you more likely to have a fall. Your health care provider may  recommend: Regular vision checks. Poor vision and conditions such as cataracts can make you more likely to have a fall. If you wear glasses, make sure to get your prescription updated if your vision changes. Medicine review. Work with your health care provider to regularly review all of the medicines you are taking, including over-the-counter medicines. Ask your health care provider about any side effects that may make you more likely to have a fall. Tell your health care provider if any medicines that you take make you feel dizzy or sleepy. Strength and balance checks. Your health care provider may recommend certain tests to check your strength and balance while standing, walking, or changing positions. Foot health exam. Foot pain and numbness, as well as not wearing proper footwear, can make you more likely to have a fall. Screenings, including: Osteoporosis screening. Osteoporosis is a condition that causes the bones to get weaker and break more easily. Blood pressure screening. Blood pressure changes and medicines to control blood pressure can make you feel dizzy. Depression screening. You may be more likely to have a fall if you have a fear of falling, feel depressed, or feel unable to do activities that you used to do. Alcohol use screening. Using too much alcohol can affect your balance and may make you more likely to have a fall. Follow these instructions at home: Lifestyle Do not drink alcohol if: Your health care provider tells you not to drink. If you drink alcohol: Limit how much you have to: 0-1 drink a day for women. 0-2 drinks a day for men. Know how much alcohol is in your drink. In the [place] one drink equals one 12 oz bottle of beer (355 mL), one 5 oz glass of wine (148 mL), or one 1 oz glass of hard liquor (44 mL). Do not use any products that contain nicotine or tobacco. These products include cigarettes, chewing tobacco, and vaping devices, such as e-cigarettes. If you need  help quitting, ask your health care provider. Activity  Follow a regular exercise program to stay fit. This will help you maintain your balance. Ask your health care provider what types of exercise are appropriate for you. If you need a cane or walker, use it as recommended by your health care provider. Wear supportive shoes that have nonskid soles. Safety  Remove any tripping hazards, such as rugs, cords, and clutter. Install safety equipment such as grab bars in bathrooms and safety rails on stairs. Keep rooms and walkways well-lit. General instructions Talk with your health care provider about your risks for falling. Tell your health care provider if: You fall. Be sure to tell your health care provider about all falls, even ones that seem minor. You feel dizzy, tiredness (fatigue), or off-balance. Take over-the-counter and prescription medicines only as told by your health care provider. These include supplements. Eat a healthy diet and maintain a healthy weight. A healthy diet includes low-fat dairy products, low-fat (lean) meats, and fiber from whole grains, beans, and lots of fruits and vegetables. Stay current with your vaccines. Schedule regular health, dental, and eye exams. Summary Having a healthy lifestyle and getting preventive care can help to protect your health and wellness after age 69. Screening and testing are the best way to find a health problem early and help you avoid having a fall. Early diagnosis and treatment give you the best chance for managing  medical conditions that are more common for people who are older than age 29. Falls are a major cause of broken bones and head injuries in people who are older than age 62. Take precautions to prevent a fall at home. Work with your health care provider to learn what changes you can make to improve your health and wellness and to prevent falls. This information is not intended to replace advice given to you by your health  care provider. Make sure you discuss any questions you have with your health care provider. Document Revised: 10/12/2020 Document Reviewed: 10/12/2020 Elsevier Patient Education  2023 Elsevier Inc.      Edwina Barth, MD [facility]

## 2022-10-04 ENCOUNTER — Other Ambulatory Visit: Payer: Self-pay | Admitting: Emergency Medicine

## 2022-10-04 DIAGNOSIS — I152 Hypertension secondary to endocrine disorders: Secondary | ICD-10-CM

## 2022-10-20 ENCOUNTER — Encounter: Payer: Self-pay | Admitting: Urology

## 2022-10-20 ENCOUNTER — Ambulatory Visit: Payer: Medicare Other | Admitting: Urology

## 2022-10-20 VITALS — BP 129/78 | HR 64 | Ht 72.0 in | Wt 286.0 lb

## 2022-10-20 DIAGNOSIS — N400 Enlarged prostate without lower urinary tract symptoms: Secondary | ICD-10-CM

## 2022-10-20 DIAGNOSIS — N401 Enlarged prostate with lower urinary tract symptoms: Secondary | ICD-10-CM

## 2022-10-20 LAB — URINALYSIS, ROUTINE W REFLEX MICROSCOPIC
Bilirubin, UA: NEGATIVE
Glucose, UA: NEGATIVE
Ketones, UA: NEGATIVE
Leukocytes,UA: NEGATIVE
Nitrite, UA: NEGATIVE
RBC, UA: NEGATIVE
Specific Gravity, UA: 1.025 (ref 1.005–1.030)
Urobilinogen, Ur: 0.2 mg/dL (ref 0.2–1.0)
pH, UA: 5.5 (ref 5.0–7.5)

## 2022-10-20 LAB — MICROSCOPIC EXAMINATION
Cast Type: NONE SEEN
Casts: NONE SEEN /lpf
Crystal Type: NONE SEEN
Crystals: NONE SEEN
Mucus, UA: NONE SEEN
RBC, Urine: NONE SEEN /hpf (ref 0–2)
Trichomonas, UA: NONE SEEN
WBC, UA: NONE SEEN /hpf (ref 0–5)
Yeast, UA: NONE SEEN

## 2022-10-20 MED ORDER — SILODOSIN 8 MG PO CAPS: 8.0000 mg | ORAL_CAPSULE | Freq: Every day | ORAL | 3 refills | Status: DC

## 2022-10-20 NOTE — Progress Notes (Signed)
Assessment: 1. Benign prostatic hyperplasia, unspecified whether lower urinary tract symptoms present      Plan: Today I had a long discussion with the patient regarding his lower urinary tract symptoms and options for additional evaluation as well as treatment. At this time we have elected to try alternative medical therapy. DC tamsulosin Rx: Silodosin 8 mg daily Patient will return in 3 months for symptom assessment and residual urine determination Depending on response may consider further evaluation including cystoscopy/OAB med  Chief Complaint: LUTS  History of Present Illness:  Gabriel Fernandez is a 67 y.o. male who is seen in consultation from Georgina Quint, MD for evaluation of BPH/lower urinary tract symptoms.. Patient reports longstanding BPH/lower urinary tract symptoms. Current IPSS = 15/3. Patient states that he has been on and off tamsulosin for a number of years but has been taking it fairly compliantly over the last 6 months.  It is help some but he is still bothered with significant symptoms.  His most predominant symptoms are weak stream and urinary frequency.  He also gets up 2-3 times per night depending on how many beers he has in the evening.  He does have DM type II.  No glucose in urine today noted on UA PSA 09/2022 = 0.21 DRE reveals an approximately 30 g prostate without evidence of nodules or induration UA is negative except for 2+ protein  Past Medical History:  Past Medical History:  Diagnosis Date   Allergy    seasonal allergies   Arthritis    Diabetes mellitus without complication (HCC)    type 2, non-insulin dependent   GERD (gastroesophageal reflux disease)    Gout    Hearing loss    dos not use hearing aids   Hyperlipidemia    Sleep apnea    Not on CPAP    Past Surgical History:  Past Surgical History:  Procedure Laterality Date   COLONOSCOPY     sev colon's    POLYPECTOMY     TOTAL HIP ARTHROPLASTY Left 09/20/2021    Procedure: LEFT TOTAL HIP ARTHROPLASTY ANTERIOR APPROACH;  Surgeon: Tarry Kos, MD;  Location: [facility] OR;  Service: Orthopedics;  Laterality: Left;  3-C    Allergies:  No Known Allergies  Family History:  Family History  Problem Relation Age of Onset   Cancer Mother    Ovarian cancer Mother    Stomach cancer Mother    Colon cancer Neg Hx    Esophageal cancer Neg Hx    Rectal cancer Neg Hx    Colon polyps Neg Hx     Social History:  Social History   Tobacco Use   Smoking status: Some Days    Packs/day: 1.00    Years: 35.00    Additional pack years: 0.00    Total pack years: 35.00    Types: Cigarettes   Smokeless tobacco: Never   Tobacco comments:    1-2 cigs a day or more   Vaping Use   Vaping Use: Never used  Substance Use Topics   Alcohol use: Yes    Alcohol/week: 12.0 standard drinks of alcohol    Types: 6 Glasses of wine, 6 Cans of beer per week   Drug use: No    Review of symptoms:  Constitutional:  Negative for unexplained weight loss, night sweats, fever, chills ENT:  Negative for nose bleeds, sinus pain, painful swallowing CV:  Negative for chest pain, shortness of breath, exercise intolerance, palpitations, loss of consciousness Resp:  Negative for cough, wheezing, shortness of breath GI:  Negative for nausea, vomiting, diarrhea, bloody stools GU:  Positives noted in HPI; otherwise negative for gross hematuria, dysuria, urinary incontinence Neuro:  Negative for seizures, poor balance, limb weakness, slurred speech Psych:  Negative for lack of energy, depression, anxiety Endocrine:  Negative for polydipsia, polyuria, symptoms of hypoglycemia (dizziness, hunger, sweating) Hematologic:  Negative for anemia, purpura, petechia, prolonged or excessive bleeding, use of anticoagulants  Allergic:  Negative for difficulty breathing or choking as a result of exposure to anything; no shellfish allergy; no allergic response (rash/itch) to materials, foods  Physical  exam: BP 129/78   Pulse 64   Ht 6' (1.829 m)   Wt 286 lb (129.7 kg)   BMI 38.79 kg/m  GENERAL APPEARANCE: Obese white male, NAD  GU: Normal external genitalia DRE: Normal sphincter tone; prostate is approximately 30 g without evidence of nodules or induration  Results: UA negative except for 2+ protein

## 2022-12-22 ENCOUNTER — Telehealth: Payer: Self-pay | Admitting: *Deleted

## 2022-12-22 NOTE — Telephone Encounter (Signed)
Attempted 1 year post op call. No answer and left VM.

## 2022-12-27 ENCOUNTER — Telehealth: Payer: Self-pay | Admitting: *Deleted

## 2022-12-27 NOTE — Telephone Encounter (Signed)
Ortho bundle 1 year call completed. ?

## 2023-01-29 ENCOUNTER — Other Ambulatory Visit: Payer: Self-pay | Admitting: Emergency Medicine

## 2023-01-29 DIAGNOSIS — E1169 Type 2 diabetes mellitus with other specified complication: Secondary | ICD-10-CM

## 2023-02-16 ENCOUNTER — Ambulatory Visit: Payer: Medicare Other | Admitting: Urology

## 2023-03-01 ENCOUNTER — Ambulatory Visit: Payer: Medicare Other | Admitting: Urology

## 2023-03-01 ENCOUNTER — Encounter: Payer: Self-pay | Admitting: Urology

## 2023-03-01 VITALS — BP 161/82 | HR 69 | Ht 72.0 in | Wt 280.0 lb

## 2023-03-01 DIAGNOSIS — N401 Enlarged prostate with lower urinary tract symptoms: Secondary | ICD-10-CM | POA: Diagnosis not present

## 2023-03-01 LAB — BLADDER SCAN AMB NON-IMAGING

## 2023-03-01 NOTE — Progress Notes (Signed)
   Assessment: 1. Benign prostatic hyperplasia with lower urinary tract symptoms, symptom details unspecified     Plan: Continue silodosin Follow-up 1 year or sooner if problems arise  Chief Complaint: Lower urinary tract symptoms  HPI: Gabriel Fernandez is a 67 y.o. male who presents for continued evaluation of BPH/lower urinary tract symptoms. Please see my note 10/20/2022 at the time of initial visit for detailed history and exam which is summarized below.  Patient reports longstanding BPH/lower urinary tract symptoms. Baseline IPSS = 15/3.  (On tamsulosin) Patient states that he has been on and off tamsulosin for a number of years but has been taking it fairly compliantly over the last 6 months.  It is help some but he is still bothered with significant symptoms.  His most predominant symptoms are weak stream and urinary frequency.  He also gets up 2-3 times per night depending on how many beers he has in the evening.  He does have DM type II.  No glucose in urine today noted on UA PSA 09/2022 = 0.21 DRE reveals an approximately 30 g prostate without evidence of nodules or induration UA is negative except for 2+ protein  10/2022--patient was started on alternative medical therapy with silodosin 8 mg daily Patient currently states that he has improved significantly since starting the silodosin. Current IPSS = 11/2 PVR today shows minimal residual  Portions of the above documentation were copied from a prior visit for review purposes only.  Allergies: No Known Allergies  PMH: Past Medical History:  Diagnosis Date   Allergy    seasonal allergies   Arthritis    Diabetes mellitus without complication (HCC)    type 2, non-insulin dependent   GERD (gastroesophageal reflux disease)    Gout    Hearing loss    dos not use hearing aids   Hyperlipidemia    Sleep apnea    Not on CPAP    PSH: Past Surgical History:  Procedure Laterality Date   COLONOSCOPY     sev colon's     POLYPECTOMY     TOTAL HIP ARTHROPLASTY Left 09/20/2021   Procedure: LEFT TOTAL HIP ARTHROPLASTY ANTERIOR APPROACH;  Surgeon: Tarry Kos, MD;  Location: [facility] OR;  Service: Orthopedics;  Laterality: Left;  3-C    SH: Social History   Tobacco Use   Smoking status: Some Days    Current packs/day: 1.00    Average packs/day: 1 pack/day for 35.0 years (35.0 ttl pk-yrs)    Types: Cigarettes   Smokeless tobacco: Never   Tobacco comments:    1-2 cigs a day or more   Vaping Use   Vaping status: Never Used  Substance Use Topics   Alcohol use: Yes    Alcohol/week: 12.0 standard drinks of alcohol    Types: 6 Glasses of wine, 6 Cans of beer per week   Drug use: No    ROS: Constitutional:  Negative for fever, chills, weight loss CV: Negative for chest pain, previous MI, hypertension Respiratory:  Negative for shortness of breath, wheezing, sleep apnea, frequent cough GI:  Negative for nausea, vomiting, bloody stool, GERD  PE: There were no vitals taken for this visit. GENERAL APPEARANCE:  Well appearing, well developed, well nourished, NAD    Results: UA clear

## 2023-03-06 LAB — URINALYSIS, ROUTINE W REFLEX MICROSCOPIC
Bilirubin, UA: NEGATIVE
Glucose, UA: NEGATIVE
Ketones, UA: NEGATIVE
Leukocytes,UA: NEGATIVE
Nitrite, UA: NEGATIVE
Specific Gravity, UA: 1.02 (ref 1.005–1.030)
Urobilinogen, Ur: 0.2 mg/dL (ref 0.2–1.0)
pH, UA: 5.5 (ref 5.0–7.5)

## 2023-03-06 LAB — MICROSCOPIC EXAMINATION
Bacteria, UA: NONE SEEN
WBC, UA: NONE SEEN /[HPF] (ref 0–5)

## 2023-04-03 ENCOUNTER — Ambulatory Visit (INDEPENDENT_AMBULATORY_CARE_PROVIDER_SITE_OTHER): Payer: Medicare Other | Admitting: Emergency Medicine

## 2023-04-03 VITALS — BP 132/72 | HR 60 | Temp 98.1°F | Ht 72.0 in | Wt 290.2 lb

## 2023-04-03 DIAGNOSIS — E1169 Type 2 diabetes mellitus with other specified complication: Secondary | ICD-10-CM | POA: Diagnosis not present

## 2023-04-03 DIAGNOSIS — E1159 Type 2 diabetes mellitus with other circulatory complications: Secondary | ICD-10-CM | POA: Diagnosis not present

## 2023-04-03 DIAGNOSIS — I152 Hypertension secondary to endocrine disorders: Secondary | ICD-10-CM

## 2023-04-03 DIAGNOSIS — Z7984 Long term (current) use of oral hypoglycemic drugs: Secondary | ICD-10-CM

## 2023-04-03 DIAGNOSIS — E785 Hyperlipidemia, unspecified: Secondary | ICD-10-CM

## 2023-04-03 DIAGNOSIS — N400 Enlarged prostate without lower urinary tract symptoms: Secondary | ICD-10-CM

## 2023-04-03 DIAGNOSIS — I872 Venous insufficiency (chronic) (peripheral): Secondary | ICD-10-CM | POA: Diagnosis not present

## 2023-04-03 DIAGNOSIS — R21 Rash and other nonspecific skin eruption: Secondary | ICD-10-CM

## 2023-04-03 LAB — HEMOGLOBIN A1C: Hgb A1c MFr Bld: 6.2 % (ref 4.6–6.5)

## 2023-04-03 MED ORDER — CLOBETASOL PROPIONATE 0.05 % EX CREA: 1.0000 | TOPICAL_CREAM | Freq: Two times a day (BID) | CUTANEOUS | 3 refills | Status: AC

## 2023-04-03 NOTE — Patient Instructions (Signed)
Health Maintenance After Age 69 After age 69, you are at a higher risk for certain long-term diseases and infections as well as injuries from falls. Falls are a major cause of broken bones and head injuries in people who are older than age 69. Getting regular preventive care can help to keep you healthy and well. Preventive care includes getting regular testing and making lifestyle changes as recommended by your health care provider. Talk with your health care provider about: Which screenings and tests you should have. A screening is a test that checks for a disease when you have no symptoms. A diet and exercise plan that is right for you. What should I know about screenings and tests to prevent falls? Screening and testing are the best ways to find a health problem early. Early diagnosis and treatment give you the best chance of managing medical conditions that are common after age 69. Certain conditions and lifestyle choices may make you more likely to have a fall. Your health care provider may recommend: Regular vision checks. Poor vision and conditions such as cataracts can make you more likely to have a fall. If you wear glasses, make sure to get your prescription updated if your vision changes. Medicine review. Work with your health care provider to regularly review all of the medicines you are taking, including over-the-counter medicines. Ask your health care provider about any side effects that may make you more likely to have a fall. Tell your health care provider if any medicines that you take make you feel dizzy or sleepy. Strength and balance checks. Your health care provider may recommend certain tests to check your strength and balance while standing, walking, or changing positions. Foot health exam. Foot pain and numbness, as well as not wearing proper footwear, can make you more likely to have a fall. Screenings, including: Osteoporosis screening. Osteoporosis is a condition that causes  the bones to get weaker and break more easily. Blood pressure screening. Blood pressure changes and medicines to control blood pressure can make you feel dizzy. Depression screening. You may be more likely to have a fall if you have a fear of falling, feel depressed, or feel unable to do activities that you used to do. Alcohol use screening. Using too much alcohol can affect your balance and may make you more likely to have a fall. Follow these instructions at home: Lifestyle Do not drink alcohol if: Your health care provider tells you not to drink. If you drink alcohol: Limit how much you have to: 0-1 drink a day for women. 0-2 drinks a day for men. Know how much alcohol is in your drink. In the [place] one drink equals one 12 oz bottle of beer (355 mL), one 5 oz glass of wine (148 mL), or one 1 oz glass of hard liquor (44 mL). Do not use any products that contain nicotine or tobacco. These products include cigarettes, chewing tobacco, and vaping devices, such as e-cigarettes. If you need help quitting, ask your health care provider. Activity  Follow a regular exercise program to stay fit. This will help you maintain your balance. Ask your health care provider what types of exercise are appropriate for you. If you need a cane or walker, use it as recommended by your health care provider. Wear supportive shoes that have nonskid soles. Safety  Remove any tripping hazards, such as rugs, cords, and clutter. Install safety equipment such as grab bars in bathrooms and safety rails on stairs. Keep rooms and walkways   well-lit. General instructions Talk with your health care provider about your risks for falling. Tell your health care provider if: You fall. Be sure to tell your health care provider about all falls, even ones that seem minor. You feel dizzy, tiredness (fatigue), or off-balance. Take over-the-counter and prescription medicines only as told by your health care provider. These include  supplements. Eat a healthy diet and maintain a healthy weight. A healthy diet includes low-fat dairy products, low-fat (lean) meats, and fiber from whole grains, beans, and lots of fruits and vegetables. Stay current with your vaccines. Schedule regular health, dental, and eye exams. Summary Having a healthy lifestyle and getting preventive care can help to protect your health and wellness after age 69. Screening and testing are the best way to find a health problem early and help you avoid having a fall. Early diagnosis and treatment give you the best chance for managing medical conditions that are more common for people who are older than age 69. Falls are a major cause of broken bones and head injuries in people who are older than age 69. Take precautions to prevent a fall at home. Work with your health care provider to learn what changes you can make to improve your health and wellness and to prevent falls. This information is not intended to replace advice given to you by your health care provider. Make sure you discuss any questions you have with your health care provider. Document Revised: 10/12/2020 Document Reviewed: 10/12/2020 Elsevier Patient Education  2024 Elsevier Inc.  

## 2023-04-03 NOTE — Assessment & Plan Note (Signed)
Diet and nutrition discussed.  Advised to decrease amount of daily carbohydrate intake and daily calories and increase amount of plant based protein in his diet 

## 2023-04-03 NOTE — Assessment & Plan Note (Signed)
Chronic stable conditions Continue atorvastatin 20 mg daily Diet and nutrition discussed

## 2023-04-03 NOTE — Assessment & Plan Note (Signed)
Well-controlled urinary symptoms Continue Rapaflo 8 mg daily

## 2023-04-03 NOTE — Assessment & Plan Note (Signed)
Unknown triggering cause. No signs of systemic illness Localized.  No red flag signs or symptoms. Recommend clobetasol cream twice a day for 7 days

## 2023-04-03 NOTE — Assessment & Plan Note (Signed)
BP Readings from Last 3 Encounters:  04/03/23 132/72  03/01/23 (!) 161/82  10/20/22 129/78   Lab Results  Component Value Date   HGBA1C 6.3 10/03/2022   Well-controlled hypertension Continue lisinopril 20 mg daily Well-controlled diabetes Continue metformin 500 mg twice a day Cardiovascular risks associated with hypertension and diabetes discussed Diet and nutrition discussed Benefits of exercise discussed

## 2023-04-03 NOTE — Progress Notes (Signed)
Gabriel Fernandez 67 y.o.   Chief Complaint  Patient presents with   Medical Management of Chronic Issues    f/u appt, patient states he has a rash on his leg, getting worse     HISTORY OF PRESENT ILLNESS: This is a 67 y.o. male here for follow-up of chronic medical conditions. Also complaining of a chronic rash to left upper and mid leg on and off for the past 6 months.  Unknown trigger. Otherwise doing well.  No other complaints or medical concerns today.  HPI   Prior to Admission medications   Medication Sig Start Date End Date Taking? Authorizing Provider  amoxicillin (AMOXIL) 500 MG capsule Take 4 pills one hour prior to dental work 11/23/21  Yes Dub Mikes, Mary L, PA-C  atorvastatin (LIPITOR) 20 MG tablet TAKE 1 TABLET BY MOUTH EVERY DAY 01/30/23  Yes Georgina Quint, MD  lisinopril (ZESTRIL) 20 MG tablet TAKE 1 TABLET BY MOUTH EVERY DAY 10/04/22  Yes Georgina Quint, MD  metFORMIN (GLUCOPHAGE) 500 MG tablet TAKE 1 TABLET BY MOUTH 2 TIMES DAILY WITH A MEAL. 01/30/23  Yes Georgina Quint, MD  silodosin (RAPAFLO) 8 MG CAPS capsule Take 1 capsule (8 mg total) by mouth daily with breakfast. 10/20/22  Yes Joline Maxcy, MD    No Known Allergies  Patient Active Problem List   Diagnosis Date Noted   Degenerative joint disease of right hip 09/20/2021   Status post total replacement of left hip 09/20/2021   Unilateral primary osteoarthritis, left hip    Hypertension associated with diabetes (HCC) 03/04/2021   Dyslipidemia associated with type 2 diabetes mellitus (HCC) 01/30/2020   PVD (peripheral vascular disease) (HCC) 01/30/2020   Chronic venous insufficiency 01/30/2020   Benign prostatic hyperplasia 01/30/2020   Tobacco use disorder, continuous 01/30/2020   Current smoker 06/08/2013   Other and unspecified hyperlipidemia 06/08/2013   Gout 06/08/2013   Morbid obesity (HCC) 06/08/2013    Past Medical History:  Diagnosis Date   Allergy    seasonal  allergies   Arthritis    Diabetes mellitus without complication (HCC)    type 2, non-insulin dependent   GERD (gastroesophageal reflux disease)    Gout    Hearing loss    dos not use hearing aids   Hyperlipidemia    Sleep apnea    Not on CPAP    Past Surgical History:  Procedure Laterality Date   COLONOSCOPY     sev colon's    POLYPECTOMY     TOTAL HIP ARTHROPLASTY Left 09/20/2021   Procedure: LEFT TOTAL HIP ARTHROPLASTY ANTERIOR APPROACH;  Surgeon: Tarry Kos, MD;  Location: [facility] OR;  Service: Orthopedics;  Laterality: Left;  3-C    Social History   Socioeconomic History   Marital status: Married    Spouse name: Not on file   Number of children: Not on file   Years of education: Not on file   Highest education level: Bachelor's degree (e.g., BA, AB, BS)  Occupational History   Not on file  Tobacco Use   Smoking status: Some Days    Current packs/day: 1.00    Average packs/day: 1 pack/day for 35.0 years (35.0 ttl pk-yrs)    Types: Cigarettes   Smokeless tobacco: Never   Tobacco comments:    1-2 cigs a day or more   Vaping Use   Vaping status: Never Used  Substance and Sexual Activity   Alcohol use: Yes    Alcohol/week: 12.0 standard drinks  of alcohol    Types: 6 Glasses of wine, 6 Cans of beer per week   Drug use: No   Sexual activity: Not Currently  Other Topics Concern   Not on file  Social History Narrative   Not on file   Social Determinants of Health   Financial Resource Strain: Low Risk  (04/03/2023)   Overall Financial Resource Strain (CARDIA)    Difficulty of Paying Living Expenses: Not hard at all  Food Insecurity: No Food Insecurity (04/03/2023)   Hunger Vital Sign    Worried About Running Out of Food in the Last Year: Never true    Ran Out of Food in the Last Year: Never true  Transportation Needs: No Transportation Needs (04/03/2023)   PRAPARE - Administrator, Civil Service (Medical): No    Lack of Transportation (Non-Medical):  No  Physical Activity: Insufficiently Active (04/03/2023)   Exercise Vital Sign    Days of Exercise per Week: 1 day    Minutes of Exercise per Session: 30 min  Stress: No Stress Concern Present (04/03/2023)   [employer] of Occupational Health - Occupational Stress Questionnaire    Feeling of Stress : Not at all  Social Connections: Unknown (04/03/2023)   Social Connection and Isolation Panel [NHANES]    Frequency of Communication with Friends and Family: Three times a week    Frequency of Social Gatherings with Friends and Family: Once a week    Attends Religious Services: Patient declined    Database administrator or Organizations: No    Attends Engineer, structural: Not on file    Marital Status: Married  Catering manager Violence: Not At Risk (11/05/2021)   Humiliation, Afraid, Rape, and Kick questionnaire    Fear of Current or Ex-Partner: No    Emotionally Abused: No    Physically Abused: No    Sexually Abused: No    Family History  Problem Relation Age of Onset   Cancer Mother    Ovarian cancer Mother    Stomach cancer Mother    Colon cancer Neg Hx    Esophageal cancer Neg Hx    Rectal cancer Neg Hx    Colon polyps Neg Hx      Review of Systems  Constitutional: Negative.  Negative for chills and fever.  HENT: Negative.  Negative for congestion and sore throat.   Respiratory: Negative.  Negative for cough and shortness of breath.   Cardiovascular: Negative.  Negative for chest pain and palpitations.  Gastrointestinal:  Negative for abdominal pain, diarrhea, nausea and vomiting.  Genitourinary: Negative.  Negative for dysuria and hematuria.  Skin:  Positive for itching and rash.  Neurological: Negative.  Negative for dizziness and headaches.  All other systems reviewed and are negative.   Vitals:   04/03/23 1501  BP: 132/72  Pulse: 60  Temp: 98.1 F (36.7 C)  SpO2: 98%    Physical Exam Vitals reviewed.  Constitutional:      Appearance:  Normal appearance. He is obese.  HENT:     Head: Normocephalic.     Mouth/Throat:     Mouth: Mucous membranes are moist.     Pharynx: Oropharynx is clear.  Eyes:     Extraocular Movements: Extraocular movements intact.     Pupils: Pupils are equal, round, and reactive to light.  Cardiovascular:     Rate and Rhythm: Normal rate and regular rhythm.     Pulses: Normal pulses.     Heart sounds:  Normal heart sounds.  Pulmonary:     Effort: Pulmonary effort is normal.     Breath sounds: Normal breath sounds.  Musculoskeletal:     Cervical back: No tenderness.  Lymphadenopathy:     Cervical: No cervical adenopathy.  Skin:    General: Skin is warm and dry.     Findings: Rash present.     Comments: Left lower leg: Erythematous rash to lower inner thigh and upper tibial area  Neurological:     General: No focal deficit present.     Mental Status: He is alert and oriented to person, place, and time.  Psychiatric:        Mood and Affect: Mood normal.      ASSESSMENT & PLAN: A total of 46 minutes was spent with the patient and counseling/coordination of care regarding preparing for this visit, review of most recent office visit notes, review of multiple chronic medical conditions under management, review of all medications, review of most recent blood work results, education on nutrition, review of health maintenance items, prognosis, documentation, and need for follow-up.  Problem List Items Addressed This Visit       Cardiovascular and Mediastinum   Chronic venous insufficiency    Well-controlled edema with no signs of cellulitis or DVT      Hypertension associated with diabetes (HCC) - Primary    BP Readings from Last 3 Encounters:  04/03/23 132/72  03/01/23 (!) 161/82  10/20/22 129/78   Lab Results  Component Value Date   HGBA1C 6.3 10/03/2022   Well-controlled hypertension Continue lisinopril 20 mg daily Well-controlled diabetes Continue metformin 500 mg twice a  day Cardiovascular risks associated with hypertension and diabetes discussed Diet and nutrition discussed Benefits of exercise discussed      Relevant Orders   Urine Microalbumin w/creat. ratio   Comprehensive metabolic panel   Hemoglobin A1c   Lipid panel     Endocrine   Dyslipidemia associated with type 2 diabetes mellitus (HCC)    Chronic stable conditions Continue atorvastatin 20 mg daily Diet and nutrition discussed      Relevant Orders   Comprehensive metabolic panel   Hemoglobin A1c   Lipid panel     Musculoskeletal and Integument   Rash and nonspecific skin eruption    Unknown triggering cause. No signs of systemic illness Localized.  No red flag signs or symptoms. Recommend clobetasol cream twice a day for 7 days      Relevant Medications   clobetasol cream (TEMOVATE) 0.05 %     Genitourinary   Benign prostatic hyperplasia (Chronic)    Well-controlled urinary symptoms Continue Rapaflo 8 mg daily        Other   Morbid obesity (HCC)    Diet and nutrition discussed Advised to decrease amount of daily carbohydrate intake and daily calories and increase amount of plant-based protein in his diet      Patient Instructions  Health Maintenance After Age 43 After age 76, you are at a higher risk for certain long-term diseases and infections as well as injuries from falls. Falls are a major cause of broken bones and head injuries in people who are older than age 75. Getting regular preventive care can help to keep you healthy and well. Preventive care includes getting regular testing and making lifestyle changes as recommended by your health care provider. Talk with your health care provider about: Which screenings and tests you should have. A screening is a test that checks for a disease when  you have no symptoms. A diet and exercise plan that is right for you. What should I know about screenings and tests to prevent falls? Screening and testing are the best ways  to find a health problem early. Early diagnosis and treatment give you the best chance of managing medical conditions that are common after age 9. Certain conditions and lifestyle choices may make you more likely to have a fall. Your health care provider may recommend: Regular vision checks. Poor vision and conditions such as cataracts can make you more likely to have a fall. If you wear glasses, make sure to get your prescription updated if your vision changes. Medicine review. Work with your health care provider to regularly review all of the medicines you are taking, including over-the-counter medicines. Ask your health care provider about any side effects that may make you more likely to have a fall. Tell your health care provider if any medicines that you take make you feel dizzy or sleepy. Strength and balance checks. Your health care provider may recommend certain tests to check your strength and balance while standing, walking, or changing positions. Foot health exam. Foot pain and numbness, as well as not wearing proper footwear, can make you more likely to have a fall. Screenings, including: Osteoporosis screening. Osteoporosis is a condition that causes the bones to get weaker and break more easily. Blood pressure screening. Blood pressure changes and medicines to control blood pressure can make you feel dizzy. Depression screening. You may be more likely to have a fall if you have a fear of falling, feel depressed, or feel unable to do activities that you used to do. Alcohol use screening. Using too much alcohol can affect your balance and may make you more likely to have a fall. Follow these instructions at home: Lifestyle Do not drink alcohol if: Your health care provider tells you not to drink. If you drink alcohol: Limit how much you have to: 0-1 drink a day for women. 0-2 drinks a day for men. Know how much alcohol is in your drink. In the [place] one drink equals one 12 oz bottle  of beer (355 mL), one 5 oz glass of wine (148 mL), or one 1 oz glass of hard liquor (44 mL). Do not use any products that contain nicotine or tobacco. These products include cigarettes, chewing tobacco, and vaping devices, such as e-cigarettes. If you need help quitting, ask your health care provider. Activity  Follow a regular exercise program to stay fit. This will help you maintain your balance. Ask your health care provider what types of exercise are appropriate for you. If you need a cane or walker, use it as recommended by your health care provider. Wear supportive shoes that have nonskid soles. Safety  Remove any tripping hazards, such as rugs, cords, and clutter. Install safety equipment such as grab bars in bathrooms and safety rails on stairs. Keep rooms and walkways well-lit. General instructions Talk with your health care provider about your risks for falling. Tell your health care provider if: You fall. Be sure to tell your health care provider about all falls, even ones that seem minor. You feel dizzy, tiredness (fatigue), or off-balance. Take over-the-counter and prescription medicines only as told by your health care provider. These include supplements. Eat a healthy diet and maintain a healthy weight. A healthy diet includes low-fat dairy products, low-fat (lean) meats, and fiber from whole grains, beans, and lots of fruits and vegetables. Stay current with your vaccines. Schedule  regular health, dental, and eye exams. Summary Having a healthy lifestyle and getting preventive care can help to protect your health and wellness after age 52. Screening and testing are the best way to find a health problem early and help you avoid having a fall. Early diagnosis and treatment give you the best chance for managing medical conditions that are more common for people who are older than age 62. Falls are a major cause of broken bones and head injuries in people who are older than age  20. Take precautions to prevent a fall at home. Work with your health care provider to learn what changes you can make to improve your health and wellness and to prevent falls. This information is not intended to replace advice given to you by your health care provider. Make sure you discuss any questions you have with your health care provider. Document Revised: 10/12/2020 Document Reviewed: 10/12/2020 Elsevier Patient Education  2024 Elsevier Inc.     Edwina Barth, MD [facility]

## 2023-04-03 NOTE — Assessment & Plan Note (Signed)
Well-controlled edema with no signs of cellulitis or DVT

## 2023-04-04 LAB — COMPREHENSIVE METABOLIC PANEL
ALT: 24 U/L (ref 0–53)
AST: 19 U/L (ref 0–37)
Albumin: 4.3 g/dL (ref 3.5–5.2)
Alkaline Phosphatase: 63 U/L (ref 39–117)
BUN: 19 mg/dL (ref 6–23)
CO2: 27 meq/L (ref 19–32)
Calcium: 9.9 mg/dL (ref 8.4–10.5)
Chloride: 104 meq/L (ref 96–112)
Creatinine, Ser: 0.89 mg/dL (ref 0.40–1.50)
GFR: 87.26 mL/min (ref >60.00)
Glucose, Bld: 88 mg/dL (ref 70–99)
Potassium: 4.8 meq/L (ref 3.5–5.1)
Sodium: 137 meq/L (ref 135–145)
Total Bilirubin: 0.4 mg/dL (ref 0.2–1.2)
Total Protein: 7.6 g/dL (ref 6.0–8.3)

## 2023-04-04 LAB — LIPID PANEL
Cholesterol: 147 mg/dL (ref 0–200)
HDL: 43.3 mg/dL (ref >39.00)
LDL Cholesterol: 68 mg/dL (ref 0–99)
NonHDL: 104.02
Total CHOL/HDL Ratio: 3
Triglycerides: 181 mg/dL — ABNORMAL HIGH (ref 0.0–149.0)
VLDL: 36.2 mg/dL (ref 0.0–40.0)

## 2023-04-04 LAB — MICROALBUMIN / CREATININE URINE RATIO
Creatinine,U: 135.5 mg/dL
Microalb Creat Ratio: 41.7 mg/g — ABNORMAL HIGH (ref 0.0–30.0)
Microalb, Ur: 56.5 mg/dL — ABNORMAL HIGH (ref 0.0–1.9)

## 2023-05-08 ENCOUNTER — Other Ambulatory Visit: Payer: Self-pay | Admitting: Urology

## 2023-06-22 DIAGNOSIS — I10 Essential (primary) hypertension: Secondary | ICD-10-CM | POA: Diagnosis not present

## 2023-06-22 DIAGNOSIS — H2513 Age-related nuclear cataract, bilateral: Secondary | ICD-10-CM | POA: Diagnosis not present

## 2023-06-22 DIAGNOSIS — H524 Presbyopia: Secondary | ICD-10-CM | POA: Diagnosis not present

## 2023-06-22 DIAGNOSIS — E119 Type 2 diabetes mellitus without complications: Secondary | ICD-10-CM | POA: Diagnosis not present

## 2023-06-22 DIAGNOSIS — H52223 Regular astigmatism, bilateral: Secondary | ICD-10-CM | POA: Diagnosis not present

## 2023-06-22 LAB — HM DIABETES EYE EXAM

## 2023-08-02 ENCOUNTER — Ambulatory Visit: Payer: Medicare Other

## 2023-08-02 DIAGNOSIS — I152 Hypertension secondary to endocrine disorders: Secondary | ICD-10-CM

## 2023-08-02 NOTE — Patient Instructions (Signed)
 Gabriel Fernandez , Thank you for taking time to come for your Medicare Wellness Visit. I appreciate your ongoing commitment to your health goals. Please review the following plan we discussed and let me know if I can assist you in the future.   Referrals/Orders/Follow-Ups/Clinician Recommendations: It was nice talking to you today.  You are due for a foot exam, and a kidney evaluation (lab) during your next visit with Dr. Alvy Bimler.  Aim for 30 minutes of exercise or brisk walking, 6-8 glasses of water, and 5 servings of fruits and vegetables each day.   This is a list of the screening recommended for you and due dates:  Health Maintenance  Topic Date Due   Zoster (Shingles) Vaccine (2 of 2) 09/26/2018   Complete foot exam   06/16/2019   Medicare Annual Wellness Visit  11/06/2022   COVID-19 Vaccine (6 - 2024-25 season) 02/05/2023   Screening for Lung Cancer  04/09/2023   Hemoglobin A1C  10/02/2023   Yearly kidney function blood test for diabetes  04/02/2024   Yearly kidney health urinalysis for diabetes  04/02/2024   Eye exam for diabetics  06/21/2024   Colon Cancer Screening  03/19/2025   DTaP/Tdap/Td vaccine (2 - Td or Tdap) 04/27/2027   Pneumonia Vaccine  Completed   Flu Shot  Completed   Hepatitis C Screening  Completed   HPV Vaccine  Aged Out    Advanced directives: (Copy Requested) Please bring a copy of your health care power of attorney and living will to the office to be added to your chart at your convenience.  Next Medicare Annual Wellness Visit scheduled for next year: Yes

## 2023-08-02 NOTE — Progress Notes (Signed)
 Subjective:   Gabriel Fernandez is a 68 y.o. who presents for a Medicare Wellness preventive visit.  Visit Complete: Virtual I connected with  Gabriel Fernandez on 08/02/23 by a video and audio enabled telemedicine application and verified that I am speaking with the correct person using two identifiers.  Patient Location: Home  Provider Location: Home Office  I discussed the limitations of evaluation and management by telemedicine. The patient expressed understanding and agreed to proceed.  Vital Signs: Because this visit was a virtual/telehealth visit, some criteria may be missing or patient reported. Any vitals not documented were not able to be obtained and vitals that have been documented are patient reported.   AWV Questionnaire: No: Patient Medicare AWV questionnaire was not completed prior to this visit.  Cardiac Risk Factors include: advanced age (>9men, >58 women);Other (see comment);diabetes mellitus;dyslipidemia;hypertension, Risk factor comments: PVD     Objective:    Today's Vitals   08/02/23 1303  Weight: 275 lb (124.7 kg)  Height: 6' (1.829 m)   Body mass index is 37.3 kg/m.     08/02/2023    1:16 PM 11/05/2021    2:12 PM 09/10/2021   11:23 AM 11/27/2018    1:40 PM 09/19/2016   10:50 AM 09/05/2016    9:11 AM  Advanced Directives  Does Patient Have a Medical Advance Directive? Yes Yes Yes Yes Yes Yes  Type of Estate agent of [place];Living will Living will Living will Healthcare Power of [place];Living will Healthcare Power of [place];Living will Healthcare Power of [place];Living will  Does patient want to make changes to medical advance directive?  No - Patient declined No - Patient declined     Copy of Healthcare Power of Attorney in Chart? No - copy requested   No - copy requested No - copy requested     Current Medications (verified) Outpatient Encounter Medications as of 08/02/2023  Medication Sig   amoxicillin (AMOXIL) 500 MG  capsule Take 4 pills one hour prior to dental work   atorvastatin (LIPITOR) 20 MG tablet TAKE 1 TABLET BY MOUTH EVERY DAY   clobetasol cream (TEMOVATE) 0.05 % Apply 1 Application topically 2 (two) times daily.   lisinopril (ZESTRIL) 20 MG tablet TAKE 1 TABLET BY MOUTH EVERY DAY   metFORMIN (GLUCOPHAGE) 500 MG tablet TAKE 1 TABLET BY MOUTH 2 TIMES DAILY WITH A MEAL.   silodosin (RAPAFLO) 8 MG CAPS capsule TAKE 1 CAPSULE BY MOUTH DAILY WITH BREAKFAST.   No facility-administered encounter medications on file as of 08/02/2023.    Allergies (verified) Patient has no known allergies.   History: Past Medical History:  Diagnosis Date   Allergy    seasonal allergies   Arthritis    Diabetes mellitus without complication (HCC)    type 2, non-insulin dependent   GERD (gastroesophageal reflux disease)    Gout    Hearing loss    dos not use hearing aids   Hyperlipidemia    Sleep apnea    Not on CPAP   Past Surgical History:  Procedure Laterality Date   COLONOSCOPY     sev colon's    POLYPECTOMY     TOTAL HIP ARTHROPLASTY Left 09/20/2021   Procedure: LEFT TOTAL HIP ARTHROPLASTY ANTERIOR APPROACH;  Surgeon: Gabriel Kos, MD;  Location: [facility] OR;  Service: Orthopedics;  Laterality: Left;  3-C   Family History  Problem Relation Age of Onset   Cancer Mother    Ovarian cancer Mother    Stomach cancer  Mother    Colon cancer Neg Hx    Esophageal cancer Neg Hx    Rectal cancer Neg Hx    Colon polyps Neg Hx    Social History   Socioeconomic History   Marital status: Married    Spouse name: Gabriel Fernandez   Number of children: 1   Years of education: Not on file   Highest education level: Bachelor's degree (e.g., BA, AB, BS)  Occupational History   Occupation: RETIRED  Tobacco Use   Smoking status: Some Days    Current packs/day: 1.00    Average packs/day: 1 pack/day for 35.0 years (35.0 ttl pk-yrs)    Types: Cigarettes   Smokeless tobacco: Never   Tobacco comments:    1-2 cigs a day or  more   Vaping Use   Vaping status: Never Used  Substance and Sexual Activity   Alcohol use: Yes    Alcohol/week: 12.0 standard drinks of alcohol    Types: 6 Glasses of wine, 6 Cans of beer per week   Drug use: No   Sexual activity: Not Currently  Other Topics Concern   Not on file  Social History Narrative   Lives at home with wife-and 1 dog   Social Drivers of Health   Financial Resource Strain: Low Risk  (07/29/2023)   Overall Financial Resource Strain (CARDIA)    Difficulty of Paying Living Expenses: Not hard at all  Food Insecurity: No Food Insecurity (07/29/2023)   Hunger Vital Sign    Worried About Running Out of Food in the Last Year: Never true    Ran Out of Food in the Last Year: Never true  Transportation Needs: No Transportation Needs (07/29/2023)   PRAPARE - Administrator, Civil Service (Medical): No    Lack of Transportation (Non-Medical): No  Physical Activity: Inactive (08/02/2023)   Exercise Vital Sign    Days of Exercise per Week: 0 days    Minutes of Exercise per Session: 0 min  Stress: No Stress Concern Present (07/29/2023)   [employer] of Occupational Health - Occupational Stress Questionnaire    Feeling of Stress : Not at all  Social Connections: Moderately Integrated (07/29/2023)   Social Connection and Isolation Panel [NHANES]    Frequency of Communication with Friends and Family: Twice a week    Frequency of Social Gatherings with Friends and Family: Once a week    Attends Religious Services: Never    Database administrator or Organizations: Yes    Attends Engineer, structural: More than 4 times per year    Marital Status: Married    Tobacco Counseling Ready to quit: Not Answered Counseling given: Not Answered Tobacco comments: 1-2 cigs a day or more     Clinical Intake:  Pre-visit preparation completed: Yes  Pain : No/denies pain     BMI - recorded: 37.3 Nutritional Status: BMI > 30  Obese Nutritional Risks:  None Diabetes: Yes CBG done?: No Did pt. bring in CBG monitor from home?: No  How often do you need to have someone help you when you read instructions, pamphlets, or other written materials from your doctor or pharmacy?: 1 - Never  Interpreter Needed?: No  Information entered by :: Jaquayla Hege, RMA   Activities of Daily Living     08/02/2023    1:04 PM  In your present state of health, do you have any difficulty performing the following activities:  Hearing? 1  Vision? 0  Difficulty concentrating or  making decisions? 0  Walking or climbing stairs? 0  Dressing or bathing? 0  Doing errands, shopping? 0  Preparing Food and eating ? N  Using the Toilet? N  In the past six months, have you accidently leaked urine? N  Do you have problems with loss of bowel control? N  Managing your Medications? N  Managing your Finances? N  Housekeeping or managing your Housekeeping? N    Patient Care Team: Georgina Quint, MD as PCP - [facility] (Internal [facility]) [facility], [facility]  Indicate any recent Medical Services you may have received from other than [facility] providers in the past year (date may be approximate).     Assessment:   This is a routine wellness examination for [place].  Hearing/Vision screen Hearing Screening - Comments:: Need hearing aides/has them do not wear them Vision Screening - Comments:: Wears eyeglasses for reading   Goals Addressed   None    Depression Screen     08/02/2023    1:19 PM 04/03/2023    3:01 PM 10/03/2022    3:08 PM 03/08/2022    1:01 PM 09/15/2021    3:04 PM 09/02/2021    1:05 PM 03/04/2021    1:20 PM  PHQ 2/9 Scores  PHQ - 2 Score 0 0 0 0 0 0 0  PHQ- 9 Score 0          Fall Risk     08/02/2023    1:16 PM 04/03/2023    3:01 PM 10/03/2022    3:08 PM 03/08/2022    1:01 PM 11/05/2021    1:52 PM  Fall Risk   Falls in the past year? 0 0 0 0 0  Number falls in past yr: 0 0 0 0 0  Injury with Fall? 0 0 0 0 0  Risk for fall due to : No  Fall Risks No Fall Risks No Fall Risks No Fall Risks No Fall Risks  Follow up Falls prevention discussed;Falls evaluation completed Falls evaluation completed Falls evaluation completed Follow up appointment Falls evaluation completed    MEDICARE RISK AT HOME:  Medicare Risk at Home Any stairs in or around the home?: Yes If so, are there any without handrails?: Yes Home free of loose throw rugs in walkways, pet beds, electrical cords, etc?: Yes Adequate lighting in your home to reduce risk of falls?: Yes Life alert?: No Use of a cane, walker or w/c?: No Grab bars in the bathroom?: No Shower chair or bench in shower?: Yes Elevated toilet seat or a handicapped toilet?: No  TIMED UP AND GO:  Was the test performed?  No  Cognitive Function: 6CIT completed        08/02/2023    1:04 PM 11/05/2021    2:16 PM 11/27/2018    1:38 PM  6CIT Screen  What Year? 0 points 0 points 0 points  What month? 0 points 0 points 0 points  What time? 0 points 0 points 0 points  Count back from 20 0 points 0 points 0 points  Months in reverse 0 points 0 points 0 points  Repeat phrase 0 points 0 points 0 points  Total Score 0 points 0 points 0 points    Immunizations Immunization History  Administered Date(s) Administered   Fluad Quad(high Dose 65+) 03/04/2021, 03/27/2023   Hepatitis A, Adult 09/06/2013, 04/26/2017   Influenza,inj,Quad PF,6+ Mos 06/08/2013, 04/26/2017, 05/29/2018   Influenza-Unspecified 03/01/2019, 04/17/2020   Moderna Sars-Covid-2 Vaccination 08/12/2019, 09/09/2019, 01/14/2021   PFIZER(Purple Top)SARS-COV-2 Vaccination  04/17/2020   PNEUMOCOCCAL CONJUGATE-20 01/14/2021   Pfizer(Comirnaty)Fall Seasonal Vaccine 12 years and older 06/19/2022   Pneumococcal Conjugate-13 04/26/2017   Pneumococcal Polysaccharide-23 11/27/2018   Tdap 04/26/2017   Zoster Recombinant(Shingrix) 08/01/2018    Screening Tests Health Maintenance  Topic Date Due   Zoster Vaccines- Shingrix (2 of 2)  09/26/2018   FOOT EXAM  06/16/2019   Medicare Annual Wellness (AWV)  11/06/2022   COVID-19 Vaccine (6 - 2024-25 season) 02/05/2023   Lung Cancer Screening  04/09/2023   HEMOGLOBIN A1C  10/02/2023   Diabetic kidney evaluation - eGFR measurement  04/02/2024   Diabetic kidney evaluation - Urine ACR  04/02/2024   OPHTHALMOLOGY EXAM  06/21/2024   Colonoscopy  03/19/2025   DTaP/Tdap/Td (2 - Td or Tdap) 04/27/2027   Pneumonia Vaccine 88+ Years old  Completed   INFLUENZA VACCINE  Completed   Hepatitis C Screening  Completed   HPV VACCINES  Aged Out    Health Maintenance  Health Maintenance Due  Topic Date Due   Zoster Vaccines- Shingrix (2 of 2) 09/26/2018   FOOT EXAM  06/16/2019   Medicare Annual Wellness (AWV)  11/06/2022   COVID-19 Vaccine (6 - 2024-25 season) 02/05/2023   Lung Cancer Screening  04/09/2023   Health Maintenance Items Addressed: See Nurse Notes  Additional Screening:  Vision Screening: Recommended annual ophthalmology exams for early detection of glaucoma and other disorders of the eye.  Dental Screening: Recommended annual dental exams for proper oral hygiene  Community Resource Referral / Chronic Care Management: CRR required this visit?  No   CCM required this visit?  No     Plan:     I have personally reviewed and noted the following in the patient's chart:   Medical and social history Use of alcohol, tobacco or illicit drugs  Current medications and supplements including opioid prescriptions. Patient is not currently taking opioid prescriptions. Functional ability and status Nutritional status Physical activity Advanced directives List of other physicians Hospitalizations, surgeries, and ER visits in previous 12 months Vitals Screenings to include cognitive, depression, and falls Referrals and appointments  In addition, I have reviewed and discussed with patient certain preventive protocols, quality metrics, and best practice  recommendations. A written personalized care plan for preventive services as well as general preventive health recommendations were provided to patient.     Pearl Berlinger L Orlen Leedy, CMA   08/02/2023   After Visit Summary: (MyChart) Due to this being a telephonic visit, the after visit summary with patients personalized plan was offered to patient via MyChart   Notes: Please refer to Routing Comments.

## 2023-09-12 LAB — LAB REPORT - SCANNED: Albumin/Creatinine Ratio, Urine, POC: 454

## 2023-10-02 ENCOUNTER — Ambulatory Visit: Payer: Medicare Other | Admitting: Emergency Medicine

## 2023-10-12 ENCOUNTER — Ambulatory Visit: Admitting: Emergency Medicine

## 2023-10-23 ENCOUNTER — Other Ambulatory Visit: Payer: Self-pay

## 2023-10-23 ENCOUNTER — Ambulatory Visit: Admitting: Emergency Medicine

## 2023-10-23 ENCOUNTER — Encounter: Payer: Self-pay | Admitting: Emergency Medicine

## 2023-10-23 VITALS — BP 114/70 | HR 63 | Temp 97.8°F | Ht 72.0 in | Wt 273.0 lb

## 2023-10-23 DIAGNOSIS — R011 Cardiac murmur, unspecified: Secondary | ICD-10-CM | POA: Diagnosis not present

## 2023-10-23 DIAGNOSIS — I872 Venous insufficiency (chronic) (peripheral): Secondary | ICD-10-CM | POA: Diagnosis not present

## 2023-10-23 DIAGNOSIS — I739 Peripheral vascular disease, unspecified: Secondary | ICD-10-CM

## 2023-10-23 DIAGNOSIS — Z7984 Long term (current) use of oral hypoglycemic drugs: Secondary | ICD-10-CM | POA: Diagnosis not present

## 2023-10-23 DIAGNOSIS — E785 Hyperlipidemia, unspecified: Secondary | ICD-10-CM

## 2023-10-23 DIAGNOSIS — F17200 Nicotine dependence, unspecified, uncomplicated: Secondary | ICD-10-CM

## 2023-10-23 DIAGNOSIS — E1159 Type 2 diabetes mellitus with other circulatory complications: Secondary | ICD-10-CM | POA: Diagnosis not present

## 2023-10-23 DIAGNOSIS — E1169 Type 2 diabetes mellitus with other specified complication: Secondary | ICD-10-CM

## 2023-10-23 DIAGNOSIS — Z136 Encounter for screening for cardiovascular disorders: Secondary | ICD-10-CM

## 2023-10-23 DIAGNOSIS — I152 Hypertension secondary to endocrine disorders: Secondary | ICD-10-CM | POA: Diagnosis not present

## 2023-10-23 DIAGNOSIS — N400 Enlarged prostate without lower urinary tract symptoms: Secondary | ICD-10-CM

## 2023-10-23 LAB — CBC WITH DIFFERENTIAL/PLATELET
Basophils Absolute: 0.1 10*3/uL (ref 0.0–0.1)
Basophils Relative: 1 % (ref 0.0–3.0)
Eosinophils Absolute: 0.2 10*3/uL (ref 0.0–0.7)
Eosinophils Relative: 2.3 % (ref 0.0–5.0)
HCT: 44.5 % (ref 39.0–52.0)
Hemoglobin: 15 g/dL (ref 13.0–17.0)
Lymphocytes Relative: 29.5 % (ref 12.0–46.0)
Lymphs Abs: 2 10*3/uL (ref 0.7–4.0)
MCHC: 33.7 g/dL (ref 30.0–36.0)
MCV: 97.2 fl (ref 78.0–100.0)
Monocytes Absolute: 0.4 10*3/uL (ref 0.1–1.0)
Monocytes Relative: 5.6 % (ref 3.0–12.0)
Neutro Abs: 4.1 10*3/uL (ref 1.4–7.7)
Neutrophils Relative %: 61.6 % (ref 43.0–77.0)
Platelets: 232 10*3/uL (ref 150.0–400.0)
RBC: 4.58 Mil/uL (ref 4.22–5.81)
RDW: 15.2 % (ref 11.5–15.5)
WBC: 6.7 10*3/uL (ref 4.0–10.5)

## 2023-10-23 LAB — COMPREHENSIVE METABOLIC PANEL WITH GFR
ALT: 21 U/L (ref 0–53)
AST: 19 U/L (ref 0–37)
Albumin: 4.4 g/dL (ref 3.5–5.2)
Alkaline Phosphatase: 46 U/L (ref 39–117)
BUN: 22 mg/dL (ref 6–23)
CO2: 23 meq/L (ref 19–32)
Calcium: 9.6 mg/dL (ref 8.4–10.5)
Chloride: 106 meq/L (ref 96–112)
Creatinine, Ser: 0.9 mg/dL (ref 0.40–1.50)
GFR: 86.63 mL/min (ref >60.00)
Glucose, Bld: 108 mg/dL — ABNORMAL HIGH (ref 70–99)
Potassium: 4.8 meq/L (ref 3.5–5.1)
Sodium: 137 meq/L (ref 135–145)
Total Bilirubin: 0.5 mg/dL (ref 0.2–1.2)
Total Protein: 7.4 g/dL (ref 6.0–8.3)

## 2023-10-23 LAB — MICROALBUMIN / CREATININE URINE RATIO
Creatinine,U: 153 mg/dL
Microalb Creat Ratio: 325.3 mg/g — ABNORMAL HIGH (ref 0.0–30.0)
Microalb, Ur: 49.8 mg/dL — ABNORMAL HIGH (ref 0.0–1.9)

## 2023-10-23 LAB — LIPID PANEL
Cholesterol: 161 mg/dL (ref 0–200)
HDL: 51.7 mg/dL (ref >39.00)
LDL Cholesterol: 80 mg/dL (ref 0–99)
NonHDL: 109.42
Total CHOL/HDL Ratio: 3
Triglycerides: 147 mg/dL (ref 0.0–149.0)
VLDL: 29.4 mg/dL (ref 0.0–40.0)

## 2023-10-23 LAB — HEMOGLOBIN A1C: Hgb A1c MFr Bld: 5.8 % (ref 4.6–6.5)

## 2023-10-23 NOTE — Assessment & Plan Note (Signed)
 Diet and nutrition discussed.  Advised to decrease amount of daily carbohydrate intake and daily calories and increase amount of plant based protein in his diet

## 2023-10-23 NOTE — Assessment & Plan Note (Signed)
 Contributing to numbness and tingling of right lower leg Poor peripheral pulses Recommend arterial Doppler ultrasound both lower extremities

## 2023-10-23 NOTE — Assessment & Plan Note (Signed)
 Recommend echocardiogram

## 2023-10-23 NOTE — Patient Instructions (Signed)
 Health Maintenance After Age 70 After age 4, you are at a higher risk for certain long-term diseases and infections as well as injuries from falls. Falls are a major cause of broken bones and head injuries in people who are older than age 47. Getting regular preventive care can help to keep you healthy and well. Preventive care includes getting regular testing and making lifestyle changes as recommended by your health care provider. Talk with your health care provider about: Which screenings and tests you should have. A screening is a test that checks for a disease when you have no symptoms. A diet and exercise plan that is right for you. What should I know about screenings and tests to prevent falls? Screening and testing are the best ways to find a health problem early. Early diagnosis and treatment give you the best chance of managing medical conditions that are common after age 37. Certain conditions and lifestyle choices may make you more likely to have a fall. Your health care provider may recommend: Regular vision checks. Poor vision and conditions such as cataracts can make you more likely to have a fall. If you wear glasses, make sure to get your prescription updated if your vision changes. Medicine review. Work with your health care provider to regularly review all of the medicines you are taking, including over-the-counter medicines. Ask your health care provider about any side effects that may make you more likely to have a fall. Tell your health care provider if any medicines that you take make you feel dizzy or sleepy. Strength and balance checks. Your health care provider may recommend certain tests to check your strength and balance while standing, walking, or changing positions. Foot health exam. Foot pain and numbness, as well as not wearing proper footwear, can make you more likely to have a fall. Screenings, including: Osteoporosis screening. Osteoporosis is a condition that causes  the bones to get weaker and break more easily. Blood pressure screening. Blood pressure changes and medicines to control blood pressure can make you feel dizzy. Depression screening. You may be more likely to have a fall if you have a fear of falling, feel depressed, or feel unable to do activities that you used to do. Alcohol use screening. Using too much alcohol can affect your balance and may make you more likely to have a fall. Follow these instructions at home: Lifestyle Do not drink alcohol if: Your health care provider tells you not to drink. If you drink alcohol: Limit how much you have to: 0-1 drink a day for women. 0-2 drinks a day for men. Know how much alcohol is in your drink. In the [place] one drink equals one 12 oz bottle of beer (355 mL), one 5 oz glass of wine (148 mL), or one 1 oz glass of hard liquor (44 mL). Do not use any products that contain nicotine or tobacco. These products include cigarettes, chewing tobacco, and vaping devices, such as e-cigarettes. If you need help quitting, ask your health care provider. Activity  Follow a regular exercise program to stay fit. This will help you maintain your balance. Ask your health care provider what types of exercise are appropriate for you. If you need a cane or walker, use it as recommended by your health care provider. Wear supportive shoes that have nonskid soles. Safety  Remove any tripping hazards, such as rugs, cords, and clutter. Install safety equipment such as grab bars in bathrooms and safety rails on stairs. Keep rooms and walkways  well-lit. General instructions Talk with your health care provider about your risks for falling. Tell your health care provider if: You fall. Be sure to tell your health care provider about all falls, even ones that seem minor. You feel dizzy, tiredness (fatigue), or off-balance. Take over-the-counter and prescription medicines only as told by your health care provider. These include  supplements. Eat a healthy diet and maintain a healthy weight. A healthy diet includes low-fat dairy products, low-fat (lean) meats, and fiber from whole grains, beans, and lots of fruits and vegetables. Stay current with your vaccines. Schedule regular health, dental, and eye exams. Summary Having a healthy lifestyle and getting preventive care can help to protect your health and wellness after age 11. Screening and testing are the best way to find a health problem early and help you avoid having a fall. Early diagnosis and treatment give you the best chance for managing medical conditions that are more common for people who are older than age 28. Falls are a major cause of broken bones and head injuries in people who are older than age 48. Take precautions to prevent a fall at home. Work with your health care provider to learn what changes you can make to improve your health and wellness and to prevent falls. This information is not intended to replace advice given to you by your health care provider. Make sure you discuss any questions you have with your health care provider. Document Revised: 10/12/2020 Document Reviewed: 10/12/2020 Elsevier Patient Education  2024 [employer].

## 2023-10-23 NOTE — Assessment & Plan Note (Signed)
 Well-controlled edema with no signs of cellulitis or DVT

## 2023-10-23 NOTE — Assessment & Plan Note (Signed)
 Well-controlled urinary symptoms Continue Rapaflo 8 mg daily

## 2023-10-23 NOTE — Assessment & Plan Note (Signed)
Cardiovascular and cancer risks associated with smoking discussed.  Smoking cessation advice given. Lung cancer screening recommended.

## 2023-10-23 NOTE — Assessment & Plan Note (Signed)
 Well-controlled hypertension Continue lisinopril  20 mg daily Well-controlled diabetes Continue metformin  500 mg twice a day Cardiovascular risks associated with hypertension and diabetes discussed Diet and nutrition discussed Benefits of exercise discussed

## 2023-10-23 NOTE — Progress Notes (Signed)
 Gabriel Fernandez 68 y.o.   Chief Complaint  Patient presents with   Follow-up    6 month f/u for HTN / DM. Patient mentions having a pain on the right jaw line. The pain comes and goes some times to the point where he can't move his head. Also mentions having some numbness in his right leg that started up a couple of weeks nothing too concerning     HISTORY OF PRESENT ILLNESS: This is a 68 y.o. male here for 3-month follow-up of multiple chronic medical conditions. Lab Results  Component Value Date   HGBA1C 6.2 04/03/2023     HPI   Prior to Admission medications   Medication Sig Start Date End Date Taking? Authorizing Provider  atorvastatin  (LIPITOR) 20 MG tablet TAKE 1 TABLET BY MOUTH EVERY DAY 01/30/23  Yes Hanan Mcwilliams, Isidro Margo, MD  clobetasol  cream (TEMOVATE ) 0.05 % Apply 1 Application topically 2 (two) times daily. 04/03/23  Yes Ajanee Buren, Isidro Margo, MD  lisinopril  (ZESTRIL ) 20 MG tablet TAKE 1 TABLET BY MOUTH EVERY DAY 10/04/22  Yes Rayleen Wyrick, Isidro Margo, MD  metFORMIN  (GLUCOPHAGE ) 500 MG tablet TAKE 1 TABLET BY MOUTH 2 TIMES DAILY WITH A MEAL. 01/30/23  Yes Garielle Mroz, Isidro Margo, MD  silodosin  (RAPAFLO ) 8 MG CAPS capsule TAKE 1 CAPSULE BY MOUTH DAILY WITH BREAKFAST. 05/09/23  Yes Scarlet Curly, MD  amoxicillin  (AMOXIL ) 500 MG capsule Take 4 pills one hour prior to dental work Patient not taking: Reported on 10/23/2023 11/23/21   Sandie Cross, PA-C    No Known Allergies  Patient Active Problem List   Diagnosis Date Noted   Rash and nonspecific skin eruption 04/03/2023   Degenerative joint disease of right hip 09/20/2021   Status post total replacement of left hip 09/20/2021   Unilateral primary osteoarthritis, left hip    Hypertension associated with diabetes (HCC) 03/04/2021   Dyslipidemia associated with type 2 diabetes mellitus (HCC) 01/30/2020   PVD (peripheral vascular disease) (HCC) 01/30/2020   Chronic venous insufficiency 01/30/2020   Benign prostatic  hyperplasia 01/30/2020   Tobacco use disorder, continuous 01/30/2020   Current smoker 06/08/2013   Other and unspecified hyperlipidemia 06/08/2013   Gout 06/08/2013   Morbid obesity (HCC) 06/08/2013    Past Medical History:  Diagnosis Date   Allergy    seasonal allergies   Arthritis    Diabetes mellitus without complication (HCC)    type 2, non-insulin  dependent   GERD (gastroesophageal reflux disease)    Gout    Hearing loss    dos not use hearing aids   Hyperlipidemia    Sleep apnea    Not on CPAP    Past Surgical History:  Procedure Laterality Date   COLONOSCOPY     sev colon's    POLYPECTOMY     TOTAL HIP ARTHROPLASTY Left 09/20/2021   Procedure: LEFT TOTAL HIP ARTHROPLASTY ANTERIOR APPROACH;  Surgeon: Wes Hamman, MD;  Location: [facility] OR;  Service: Orthopedics;  Laterality: Left;  3-C    Social History   Socioeconomic History   Marital status: Married    Spouse name: Adelle   Number of children: 1   Years of education: Not on file   Highest education level: Bachelor's degree (e.g., BA, AB, BS)  Occupational History   Occupation: RETIRED  Tobacco Use   Smoking status: Some Days    Current packs/day: 1.00    Average packs/day: 1 pack/day for 35.0 years (35.0 ttl pk-yrs)    Types: Cigarettes  Smokeless tobacco: Never   Tobacco comments:    1-2 cigs a day or more   Vaping Use   Vaping status: Never Used  Substance and Sexual Activity   Alcohol use: Yes    Alcohol/week: 12.0 standard drinks of alcohol    Types: 6 Glasses of wine, 6 Cans of beer per week   Drug use: No   Sexual activity: Not Currently  Other Topics Concern   Not on file  Social History Narrative   Lives at home with wife-and 1 dog   Social Drivers of Health   Financial Resource Strain: Low Risk  (07/29/2023)   Overall Financial Resource Strain (CARDIA)    Difficulty of Paying Living Expenses: Not hard at all  Food Insecurity: No Food Insecurity (07/29/2023)   Hunger Vital Sign     Worried About Running Out of Food in the Last Year: Never true    Ran Out of Food in the Last Year: Never true  Transportation Needs: No Transportation Needs (07/29/2023)   PRAPARE - Administrator, Civil Service (Medical): No    Lack of Transportation (Non-Medical): No  Physical Activity: Inactive (08/02/2023)   Exercise Vital Sign    Days of Exercise per Week: 0 days    Minutes of Exercise per Session: 0 min  Stress: No Stress Concern Present (07/29/2023)   [employer] of Occupational Health - Occupational Stress Questionnaire    Feeling of Stress : Not at all  Social Connections: Moderately Integrated (07/29/2023)   Social Connection and Isolation Panel [NHANES]    Frequency of Communication with Friends and Family: Twice a week    Frequency of Social Gatherings with Friends and Family: Once a week    Attends Religious Services: Never    Database administrator or Organizations: Yes    Attends Engineer, structural: More than 4 times per year    Marital Status: Married  Catering manager Violence: Not At Risk (08/02/2023)   Humiliation, Afraid, Rape, and Kick questionnaire    Fear of Current or Ex-Partner: No    Emotionally Abused: No    Physically Abused: No    Sexually Abused: No    Family History  Problem Relation Age of Onset   Cancer Mother    Ovarian cancer Mother    Stomach cancer Mother    Colon cancer Neg Hx    Esophageal cancer Neg Hx    Rectal cancer Neg Hx    Colon polyps Neg Hx      Review of Systems  Constitutional: Negative.  Negative for chills and fever.  HENT: Negative.  Negative for congestion and sore throat.   Respiratory: Negative.  Negative for cough and shortness of breath.   Cardiovascular: Negative.  Negative for chest pain and palpitations.  Gastrointestinal:  Negative for abdominal pain, diarrhea, nausea and vomiting.  Genitourinary: Negative.  Negative for dysuria and hematuria.  Skin: Negative.  Negative for rash.   Neurological: Negative.  Negative for dizziness and headaches.  All other systems reviewed and are negative.   Today's Vitals   10/23/23 1337  BP: 114/70  Pulse: 63  Temp: 97.8 F (36.6 C)  TempSrc: Oral  SpO2: 95%  Weight: 273 lb (123.8 kg)  Height: 6' (1.829 m)   Body mass index is 37.03 kg/m.   Physical Exam Vitals reviewed.  HENT:     Head: Normocephalic.     Mouth/Throat:     Mouth: Mucous membranes are moist.  Pharynx: Oropharynx is clear.  Eyes:     Extraocular Movements: Extraocular movements intact.     Pupils: Pupils are equal, round, and reactive to light.  Cardiovascular:     Rate and Rhythm: Normal rate and regular rhythm.     Heart sounds: Murmur heard.  Pulmonary:     Effort: Pulmonary effort is normal.     Breath sounds: Normal breath sounds.  Abdominal:     Palpations: Abdomen is soft.     Tenderness: There is no abdominal tenderness.  Musculoskeletal:     Cervical back: No tenderness.  Lymphadenopathy:     Cervical: No cervical adenopathy.  Skin:    General: Skin is warm and dry.  Neurological:     General: No focal deficit present.     Mental Status: He is alert and oriented to person, place, and time.  Psychiatric:        Mood and Affect: Mood normal.        Behavior: Behavior normal.      ASSESSMENT & PLAN: A total of 44 minutes was spent with the patient and counseling/coordination of care regarding preparing for this visit, review of most recent office visit notes, review of multiple chronic medical conditions and their management, review of all medications, review of most recent bloodwork results, review of health maintenance items, education on nutrition, prognosis, documentation, and need for follow up.   Problem List Items Addressed This Visit       Cardiovascular and Mediastinum   Peripheral arterial disease (HCC)   Contributing to numbness and tingling of right lower leg Poor peripheral pulses Recommend arterial  Doppler ultrasound both lower extremities      Relevant Orders   [facility] LOWER EXTREMITY ARTERIAL DUPLEX BILAT (BACK OFFICE)   Chronic venous insufficiency   Well-controlled edema with no signs of cellulitis or DVT      Hypertension associated with diabetes (HCC) - Primary   Well-controlled hypertension Continue lisinopril  20 mg daily Well-controlled diabetes Continue metformin  500 mg twice a day Cardiovascular risks associated with hypertension and diabetes discussed Diet and nutrition discussed Benefits of exercise discussed      Relevant Orders   CBC with Differential/Platelet   Comprehensive metabolic panel with GFR   Hemoglobin A1c   Lipid panel   Microalbumin / creatinine urine ratio     Endocrine   Dyslipidemia associated with type 2 diabetes mellitus (HCC)   Chronic stable conditions Continue atorvastatin  20 mg daily Diet and nutrition discussed      Relevant Orders   CBC with Differential/Platelet   Comprehensive metabolic panel with GFR   Hemoglobin A1c   Lipid panel   Microalbumin / creatinine urine ratio     Genitourinary   Benign prostatic hyperplasia (Chronic)   Well-controlled urinary symptoms Continue Rapaflo  8 mg daily        Other   Current smoker   Cardiovascular and cancer risks associated with smoking discussed.  Smoking cessation advice given. Lung cancer screening recommended.      Relevant Orders   [place]  [place] MEDICARE SCREENING   Morbid obesity (HCC)   Diet and nutrition discussed Advised to decrease amount of daily carbohydrate intake and daily calories and increase amount of plant-based protein in his diet      Heart murmur   Recommend echocardiogram      Relevant Orders   ECHOCARDIOGRAM COMPLETE   Other Visit Diagnoses       Screening for AAA (abdominal aortic aneurysm)  Relevant Orders   [place] MEDICARE SCREENING      Patient Instructions  Health Maintenance After Age 61 After age 57, you are at a higher risk  for certain long-term diseases and infections as well as injuries from falls. Falls are a major cause of broken bones and head injuries in people who are older than age 75. Getting regular preventive care can help to keep you healthy and well. Preventive care includes getting regular testing and making lifestyle changes as recommended by your health care provider. Talk with your health care provider about: Which screenings and tests you should have. A screening is a test that checks for a disease when you have no symptoms. A diet and exercise plan that is right for you. What should I know about screenings and tests to prevent falls? Screening and testing are the best ways to find a health problem early. Early diagnosis and treatment give you the best chance of managing medical conditions that are common after age 61. Certain conditions and lifestyle choices may make you more likely to have a fall. Your health care provider may recommend: Regular vision checks. Poor vision and conditions such as cataracts can make you more likely to have a fall. If you wear glasses, make sure to get your prescription updated if your vision changes. Medicine review. Work with your health care provider to regularly review all of the medicines you are taking, including over-the-counter medicines. Ask your health care provider about any side effects that may make you more likely to have a fall. Tell your health care provider if any medicines that you take make you feel dizzy or sleepy. Strength and balance checks. Your health care provider may recommend certain tests to check your strength and balance while standing, walking, or changing positions. Foot health exam. Foot pain and numbness, as well as not wearing proper footwear, can make you more likely to have a fall. Screenings, including: Osteoporosis screening. Osteoporosis is a condition that causes the bones to get weaker and break more easily. Blood pressure screening.  Blood pressure changes and medicines to control blood pressure can make you feel dizzy. Depression screening. You may be more likely to have a fall if you have a fear of falling, feel depressed, or feel unable to do activities that you used to do. Alcohol use screening. Using too much alcohol can affect your balance and may make you more likely to have a fall. Follow these instructions at home: Lifestyle Do not drink alcohol if: Your health care provider tells you not to drink. If you drink alcohol: Limit how much you have to: 0-1 drink a day for women. 0-2 drinks a day for men. Know how much alcohol is in your drink. In the [place] one drink equals one 12 oz bottle of beer (355 mL), one 5 oz glass of wine (148 mL), or one 1 oz glass of hard liquor (44 mL). Do not use any products that contain nicotine  or tobacco. These products include cigarettes, chewing tobacco, and vaping devices, such as e-cigarettes. If you need help quitting, ask your health care provider. Activity  Follow a regular exercise program to stay fit. This will help you maintain your balance. Ask your health care provider what types of exercise are appropriate for you. If you need a cane or walker, use it as recommended by your health care provider. Wear supportive shoes that have nonskid soles. Safety  Remove any tripping hazards, such as rugs, cords, and clutter. Install  safety equipment such as grab bars in bathrooms and safety rails on stairs. Keep rooms and walkways well-lit. General instructions Talk with your health care provider about your risks for falling. Tell your health care provider if: You fall. Be sure to tell your health care provider about all falls, even ones that seem minor. You feel dizzy, tiredness (fatigue), or off-balance. Take over-the-counter and prescription medicines only as told by your health care provider. These include supplements. Eat a healthy diet and maintain a healthy weight. A healthy  diet includes low-fat dairy products, low-fat (lean) meats, and fiber from whole grains, beans, and lots of fruits and vegetables. Stay current with your vaccines. Schedule regular health, dental, and eye exams. Summary Having a healthy lifestyle and getting preventive care can help to protect your health and wellness after age 61. Screening and testing are the best way to find a health problem early and help you avoid having a fall. Early diagnosis and treatment give you the best chance for managing medical conditions that are more common for people who are older than age 68. Falls are a major cause of broken bones and head injuries in people who are older than age 48. Take precautions to prevent a fall at home. Work with your health care provider to learn what changes you can make to improve your health and wellness and to prevent falls. This information is not intended to replace advice given to you by your health care provider. Make sure you discuss any questions you have with your health care provider. Document Revised: 10/12/2020 Document Reviewed: 10/12/2020 Elsevier Patient Education  2024 Elsevier Inc.       Maryagnes Small, MD [facility]

## 2023-10-23 NOTE — Assessment & Plan Note (Signed)
 Chronic stable conditions Continue atorvastatin 20 mg daily Diet and nutrition discussed

## 2023-10-24 ENCOUNTER — Ambulatory Visit: Payer: Self-pay | Admitting: Emergency Medicine

## 2023-10-25 ENCOUNTER — Other Ambulatory Visit: Payer: Self-pay | Admitting: Emergency Medicine

## 2023-10-25 DIAGNOSIS — E1159 Type 2 diabetes mellitus with other circulatory complications: Secondary | ICD-10-CM

## 2023-10-27 ENCOUNTER — Other Ambulatory Visit

## 2023-11-01 ENCOUNTER — Ambulatory Visit
Admission: RE | Admit: 2023-11-01 | Discharge: 2023-11-01 | Disposition: A | Discharge status: Home / Self Care | Source: Ambulatory Visit | Attending: Emergency Medicine | Admitting: Emergency Medicine

## 2023-11-01 DIAGNOSIS — E785 Hyperlipidemia, unspecified: Secondary | ICD-10-CM | POA: Diagnosis not present

## 2023-11-01 DIAGNOSIS — Z136 Encounter for screening for cardiovascular disorders: Secondary | ICD-10-CM

## 2023-11-01 DIAGNOSIS — F17200 Nicotine dependence, unspecified, uncomplicated: Secondary | ICD-10-CM | POA: Diagnosis not present

## 2023-11-10 ENCOUNTER — Other Ambulatory Visit: Payer: Self-pay | Admitting: Emergency Medicine

## 2023-11-10 DIAGNOSIS — E1129 Type 2 diabetes mellitus with other diabetic kidney complication: Secondary | ICD-10-CM

## 2023-11-10 MED ORDER — FARXIGA 10 MG PO TABS: 10.0000 mg | ORAL_TABLET | Freq: Every day | ORAL | 3 refills | Status: DC

## 2023-11-13 ENCOUNTER — Encounter: Payer: Self-pay | Admitting: Emergency Medicine

## 2023-11-14 ENCOUNTER — Other Ambulatory Visit: Payer: Self-pay | Admitting: Radiology

## 2023-11-14 DIAGNOSIS — R809 Proteinuria, unspecified: Secondary | ICD-10-CM

## 2023-11-14 MED ORDER — DAPAGLIFLOZIN PROPANEDIOL 10 MG PO TABS: 10.0000 mg | ORAL_TABLET | Freq: Every day | ORAL | 3 refills | Status: AC

## 2023-11-14 NOTE — Telephone Encounter (Signed)
 Okay to prescribe generic please.

## 2023-11-15 IMAGING — RF DG HIP (WITH OR WITHOUT PELVIS) 1V*L*
1 series · 7 of 7 positions shown · non-contrast
Comparison: Pelvis x-rays dated March 26, 2021.

CLINICAL DATA: Left hip arthroplasty.

EXAM:
DG HIP (WITH OR WITHOUT PELVIS) 1V*L*

[Series 1: dg x-ray · 0.20mm/px · 7 of 7 slices shown]
[im 1/7]
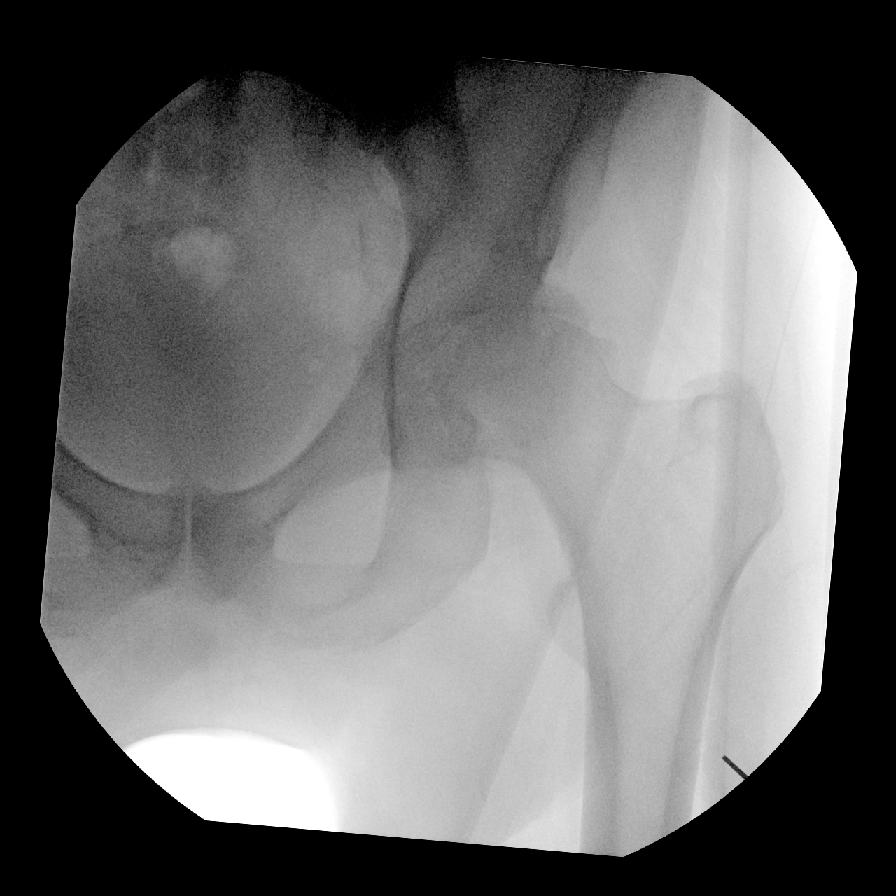
[im 2/7]
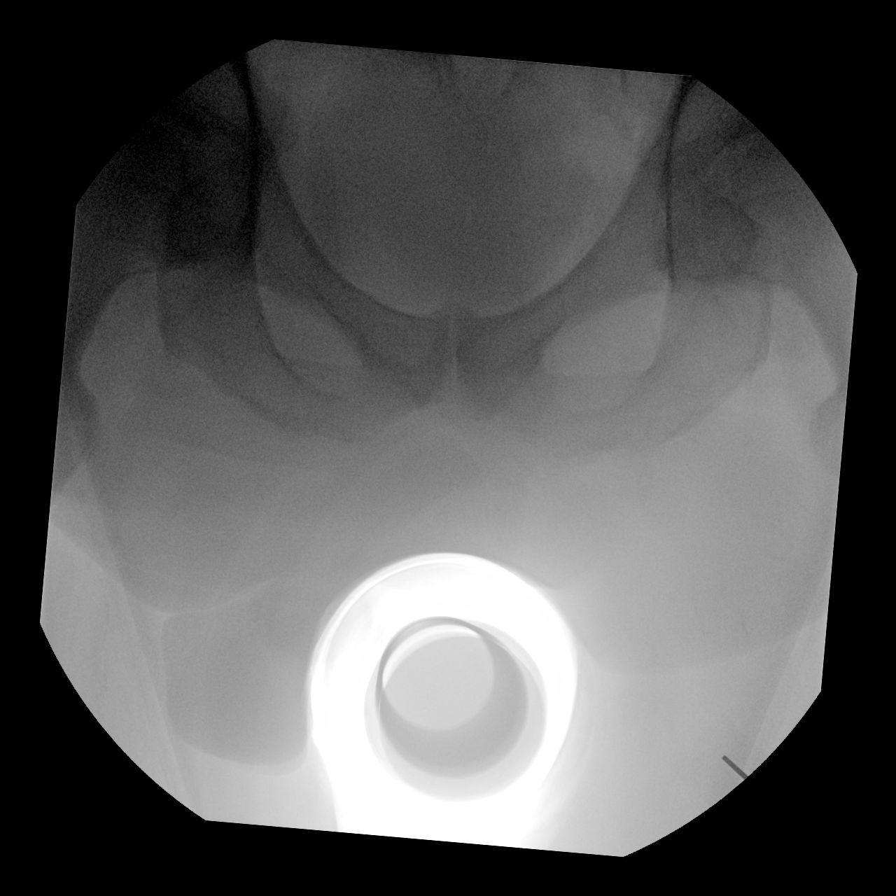
[im 3/7]
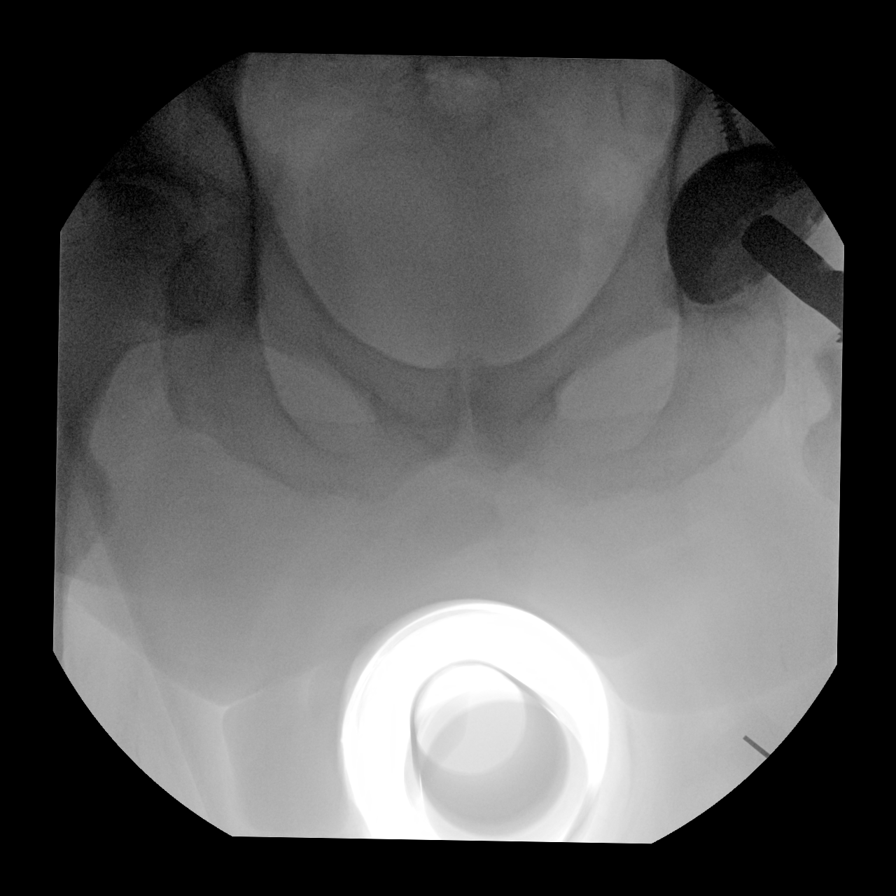
[im 4/7]
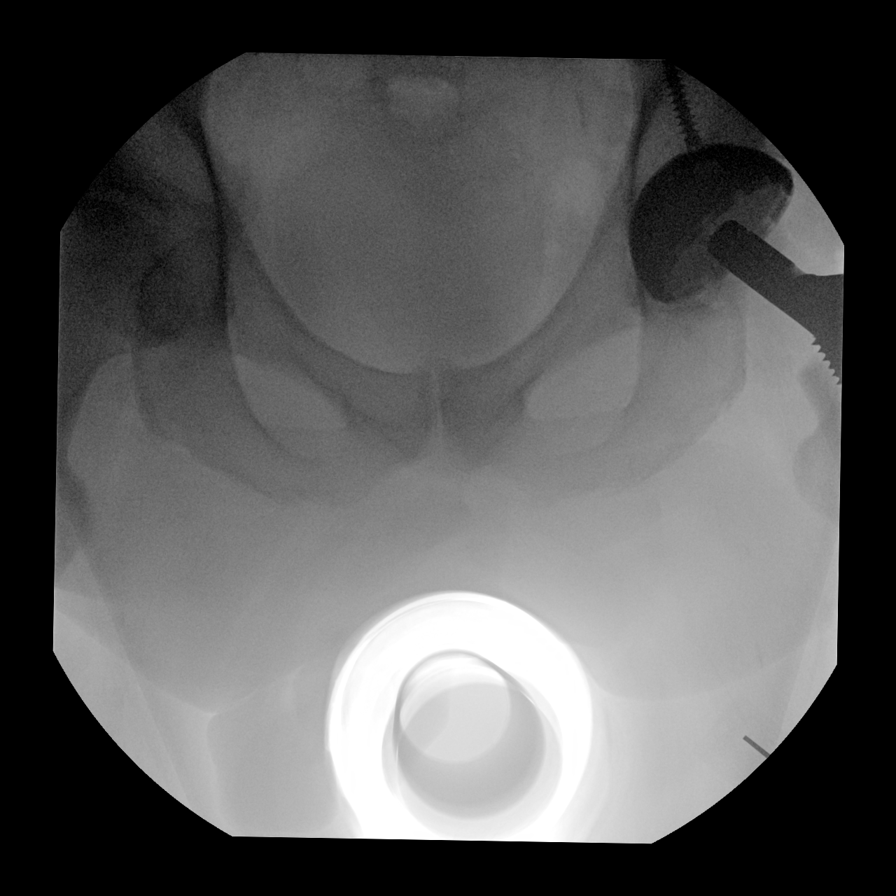
[im 5/7]
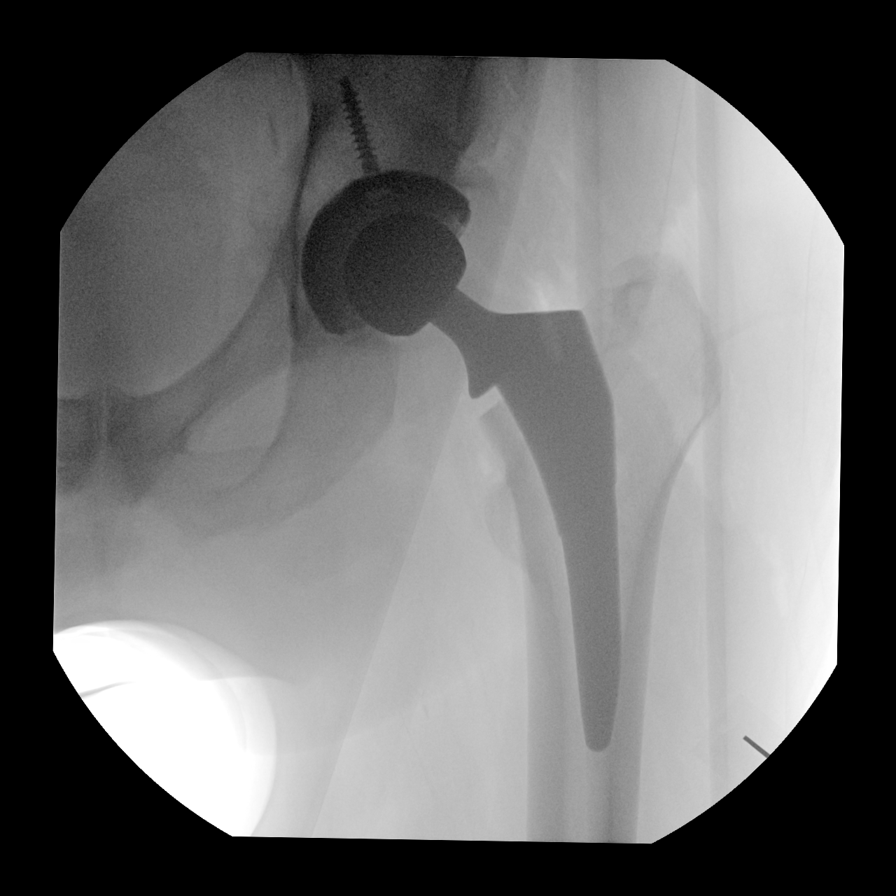
[im 6/7]
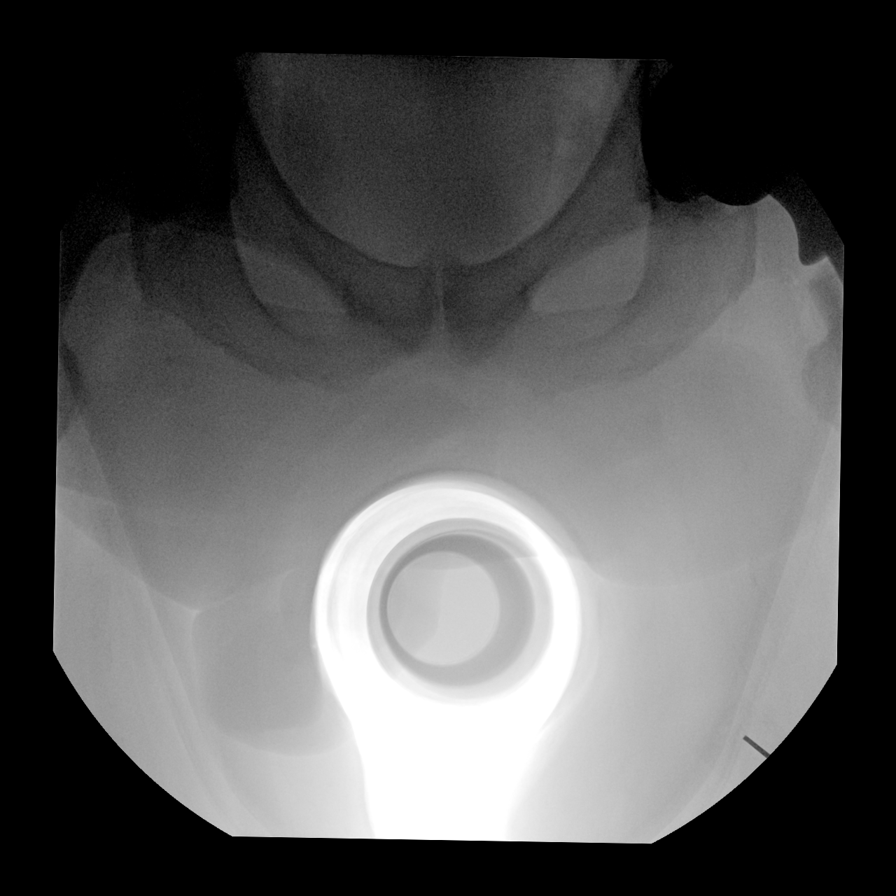
[im 7/7]
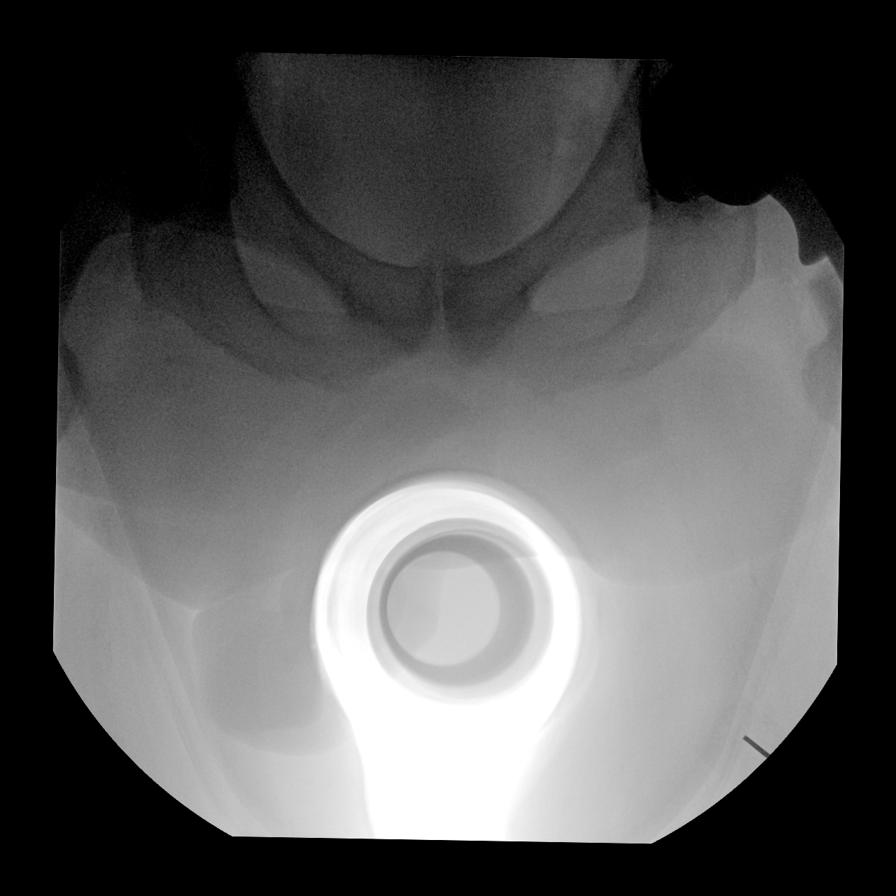

[7 of 7 positions shown; findings below may reference images not displayed]

FLUOROSCOPY TIME:  Radiation Exposure Index (as provided by the
fluoroscopic device): 11.22 mGy Kerma

C-arm fluoroscopic images were obtained intraoperatively and
submitted for post operative interpretation.
FINDINGS: Multiple intraoperative fluoroscopic images demonstrate interval
left total hip arthroplasty. Components are well aligned. No acute
osseous abnormality.
IMPRESSION: 1. Intraoperative fluoroscopic guidance for left total hip
arthroplasty.

## 2023-12-05 ENCOUNTER — Ambulatory Visit (HOSPITAL_COMMUNITY)
Admission: RE | Admit: 2023-12-05 | Discharge: 2023-12-05 | Disposition: A | Discharge status: Home / Self Care | Source: Ambulatory Visit | Attending: Cardiology | Admitting: Cardiology

## 2023-12-05 DIAGNOSIS — R011 Cardiac murmur, unspecified: Secondary | ICD-10-CM | POA: Diagnosis not present

## 2023-12-07 LAB — ECHOCARDIOGRAM COMPLETE
AR max vel: 2.4 cm2
AV Area VTI: 2.47 cm2
AV Area mean vel: 2.31 cm2
AV Mean grad: 10.4 mmHg
AV Peak grad: 19.3 mmHg
Ao pk vel: 2.2 m/s
Area-P 1/2: 2.83 cm2
S' Lateral: 3 cm

## 2024-02-29 ENCOUNTER — Ambulatory Visit: Payer: Medicare Other | Admitting: Urology

## 2024-02-29 ENCOUNTER — Encounter: Payer: Self-pay | Admitting: Urology

## 2024-02-29 VITALS — BP 132/85 | HR 77 | Ht 72.0 in | Wt 275.0 lb

## 2024-02-29 DIAGNOSIS — N401 Enlarged prostate with lower urinary tract symptoms: Secondary | ICD-10-CM | POA: Diagnosis not present

## 2024-02-29 LAB — URINALYSIS, ROUTINE W REFLEX MICROSCOPIC
Bilirubin, UA: NEGATIVE
Ketones, UA: NEGATIVE
Leukocytes,UA: NEGATIVE
Nitrite, UA: NEGATIVE
RBC, UA: NEGATIVE
Specific Gravity, UA: 1.02 (ref 1.005–1.030)
Urobilinogen, Ur: 0.2 mg/dL (ref 0.2–1.0)
pH, UA: 5.5 (ref 5.0–7.5)

## 2024-02-29 LAB — MICROSCOPIC EXAMINATION: Bacteria, UA: NONE SEEN

## 2024-02-29 LAB — BLADDER SCAN AMB NON-IMAGING

## 2024-02-29 MED ORDER — SILODOSIN 8 MG PO CAPS: 8.0000 mg | ORAL_CAPSULE | Freq: Every day | ORAL | 3 refills | Status: AC

## 2024-02-29 MED ORDER — SOLIFENACIN SUCCINATE 5 MG PO TABS: 5.0000 mg | ORAL_TABLET | Freq: Every day | ORAL | 11 refills | Status: AC

## 2024-02-29 NOTE — Progress Notes (Signed)
 Assessment: 1. Benign prostatic hyperplasia with lower urinary tract symptoms, symptom details unspecified     Plan: I personally reviewed the patient's chart including provider notes, and lab results. Continue silodosin  8 mg daily. Trial of solifenacin  5 mg daily.  Prescription sent.  Use and side effects discussed. PSA today. Return to office in 6 weeks  Chief Complaint: Chief Complaint  Patient presents with   Benign Prostatic Hypertrophy    HPI: Gabriel Fernandez is a 68 y.o. male who presents for continued evaluation of BPH with lower urinary tract symptoms. He was previously followed by Dr. Shona with his last visit in September 2024.  Summary of prior evaluation: Patient reports longstanding BPH/lower urinary tract symptoms. Baseline IPSS = 15/3.  (On tamsulosin ) Patient states that he has been on and off tamsulosin  for a number of years but has been taking it fairly compliantly over the last 6 months.  It is help some but he is still bothered with significant symptoms.  His most predominant symptoms are weak stream and urinary frequency.  He also gets up 2-3 times per night depending on how many beers he has in the evening.  He does have DM type II.  No glucose in urine today noted on UA PSA 09/2022 = 0.21 DRE reveals an approximately 30 g prostate without evidence of nodules or induration UA is negative except for 2+ protein   10/2022--patient was started on alternative medical therapy with silodosin  8 mg daily 02/2023 -- Patient currently states that he has improved significantly since starting the silodosin . Current IPSS = 11/2 PVR shows minimal residual.  He returns today for follow-up.  He continues on silodosin  8 mg daily.  He continues to have lower urinary tract symptoms with frequency, urgency, nocturia x 3, decreased force of stream, and occasional incontinence.  No dysuria or gross hematuria. IPSS = 17/4.  Portions of the above documentation were copied from a  prior visit for review purposes only.  Allergies: No Known Allergies  PMH: Past Medical History:  Diagnosis Date   Allergy    seasonal allergies   Arthritis    Diabetes mellitus without complication (HCC)    type 2, non-insulin  dependent   GERD (gastroesophageal reflux disease)    Gout    Hearing loss    dos not use hearing aids   Hyperlipidemia    Sleep apnea    Not on CPAP    PSH: Past Surgical History:  Procedure Laterality Date   COLONOSCOPY     sev colon's    POLYPECTOMY     TOTAL HIP ARTHROPLASTY Left 09/20/2021   Procedure: LEFT TOTAL HIP ARTHROPLASTY ANTERIOR APPROACH;  Surgeon: Jerri Kay HERO, MD;  Location: [facility] OR;  Service: Orthopedics;  Laterality: Left;  3-C    SH: Social History   Tobacco Use   Smoking status: Some Days    Current packs/day: 1.00    Average packs/day: 1 pack/day for 35.0 years (35.0 ttl pk-yrs)    Types: Cigarettes   Smokeless tobacco: Never   Tobacco comments:    1-2 cigs a day or more   Vaping Use   Vaping status: Never Used  Substance Use Topics   Alcohol use: Yes    Alcohol/week: 12.0 standard drinks of alcohol    Types: 6 Glasses of wine, 6 Cans of beer per week   Drug use: No    ROS: Constitutional:  Negative for fever, chills, weight loss CV: Negative for chest pain, previous MI, hypertension Respiratory:  Negative for shortness of breath, wheezing, sleep apnea, frequent cough GI:  Negative for nausea, vomiting, bloody stool, GERD  PE: BP 132/85   Pulse 77   Ht 6' (1.829 m)   Wt 275 lb (124.7 kg)   BMI 37.30 kg/m  GENERAL APPEARANCE:  Well appearing, well developed, well nourished, NAD HEENT:  Atraumatic, normocephalic, oropharynx clear NECK:  Supple without lymphadenopathy or thyromegaly ABDOMEN:  Soft, non-tender, no masses EXTREMITIES:  Moves all extremities well, without clubbing, cyanosis, or edema NEUROLOGIC:  Alert and oriented x 3, normal gait, CN II-XII grossly intact MENTAL STATUS:  appropriate BACK:   Non-tender to palpation, No CVAT SKIN:  Warm, dry, and intact GU: Prostate: 40 g, NT, no nodules Rectum: Normal tone,  no masses or tenderness   Results: U/A: 3+ glucose, 0-5 WBCs, 0-2 RBCs  PVR = 8 ml

## 2024-03-01 ENCOUNTER — Ambulatory Visit: Payer: Self-pay | Admitting: Urology

## 2024-03-01 LAB — PSA: Prostate Specific Ag, Serum: 0.2 ng/mL (ref 0.0–4.0)

## 2024-03-06 DIAGNOSIS — H905 Unspecified sensorineural hearing loss: Secondary | ICD-10-CM | POA: Diagnosis not present

## 2024-04-11 ENCOUNTER — Ambulatory Visit: Admitting: Urology

## 2024-04-11 ENCOUNTER — Encounter: Payer: Self-pay | Admitting: Urology

## 2024-04-11 VITALS — BP 134/70 | HR 66 | Ht 73.0 in | Wt 270.0 lb

## 2024-04-11 DIAGNOSIS — N401 Enlarged prostate with lower urinary tract symptoms: Secondary | ICD-10-CM

## 2024-04-11 LAB — URINALYSIS, ROUTINE W REFLEX MICROSCOPIC
Bilirubin, UA: NEGATIVE
Ketones, UA: NEGATIVE
Leukocytes,UA: NEGATIVE
Nitrite, UA: NEGATIVE
RBC, UA: NEGATIVE
Specific Gravity, UA: 1.02 (ref 1.005–1.030)
Urobilinogen, Ur: 0.2 mg/dL (ref 0.2–1.0)
pH, UA: 5 (ref 5.0–7.5)

## 2024-04-11 LAB — MICROSCOPIC EXAMINATION: Bacteria, UA: NONE SEEN

## 2024-04-11 NOTE — Progress Notes (Signed)
 Assessment: 1. Benign prostatic hyperplasia with lower urinary tract symptoms, symptom details unspecified     Plan: Continue silodosin  8 mg daily. I discussed a trial of increased dose of the solifenacin  or possibly a different medication. At this time, he would like to continue with his current regimen. Return to office in 3 to 4 months.  Chief Complaint: Chief Complaint  Patient presents with   Benign Prostatic Hypertrophy    HPI: Gabriel Fernandez is a 68 y.o. male who presents for continued evaluation of BPH with lower urinary tract symptoms. He was previously followed by Dr. Shona with his last visit in September 2024.  Summary of prior evaluation: Patient reports longstanding BPH/lower urinary tract symptoms. Baseline IPSS = 15/3.  (On tamsulosin ) Patient states that he has been on and off tamsulosin  for a number of years but has been taking it fairly compliantly over the last 6 months.  It is help some but he is still bothered with significant symptoms.  His most predominant symptoms are weak stream and urinary frequency.  He also gets up 2-3 times per night depending on how many beers he has in the evening.  He does have DM type II.  No glucose in urine today noted on UA PSA 09/2022 = 0.21 DRE reveals an approximately 30 g prostate without evidence of nodules or induration UA is negative except for 2+ protein   10/2022--patient was started on alternative medical therapy with silodosin  8 mg daily 02/2023 -- Patient currently states that he has improved significantly since starting the silodosin . IPSS = 11/2 PVR shows minimal residual.  At his visit in September 2025, he continued on silodosin  8 mg daily.  He continued to have lower urinary tract symptoms with frequency, urgency, nocturia x 3, decreased force of stream, and occasional incontinence.  No dysuria or gross hematuria. IPSS = 17/4. PVR = 8 mL. He was given a trial of solifenacin  5 mg daily.  PSA 02/29/2024:  0.2  He returns today for follow-up.  He continues on silodosin  and solifenacin .  He is not sure if the solifenacin  has made a significant difference in his symptoms.  He continues to have frequency, urgency, and decreased stream.  No dysuria or gross hematuria. IPSS = 13/3.  Portions of the above documentation were copied from a prior visit for review purposes only.  Allergies: No Known Allergies  PMH: Past Medical History:  Diagnosis Date   Allergy    seasonal allergies   Arthritis    Diabetes mellitus without complication (HCC)    type 2, non-insulin  dependent   GERD (gastroesophageal reflux disease)    Gout    Hearing loss    dos not use hearing aids   Hyperlipidemia    Sleep apnea    Not on CPAP    PSH: Past Surgical History:  Procedure Laterality Date   COLONOSCOPY     sev colon's    POLYPECTOMY     TOTAL HIP ARTHROPLASTY Left 09/20/2021   Procedure: LEFT TOTAL HIP ARTHROPLASTY ANTERIOR APPROACH;  Surgeon: Jerri Kay HERO, MD;  Location: [facility] OR;  Service: Orthopedics;  Laterality: Left;  3-C    SH: Social History   Tobacco Use   Smoking status: Some Days    Current packs/day: 1.00    Average packs/day: 1 pack/day for 35.0 years (35.0 ttl pk-yrs)    Types: Cigarettes   Smokeless tobacco: Never   Tobacco comments:    1-2 cigs a day or more   Vaping  Use   Vaping status: Never Used  Substance Use Topics   Alcohol use: Yes    Alcohol/week: 12.0 standard drinks of alcohol    Types: 6 Glasses of wine, 6 Cans of beer per week   Drug use: No    ROS: Constitutional:  Negative for fever, chills, weight loss CV: Negative for chest pain, previous MI, hypertension Respiratory:  Negative for shortness of breath, wheezing, sleep apnea, frequent cough GI:  Negative for nausea, vomiting, bloody stool, GERD  PE: BP 134/70   Pulse 66   Ht 6' 1 (1.854 m)   Wt 270 lb (122.5 kg)   BMI 35.62 kg/m  GENERAL APPEARANCE:  Well appearing, well developed, well nourished,  NAD HEENT:  Atraumatic, normocephalic, oropharynx clear NECK:  Supple without lymphadenopathy or thyromegaly ABDOMEN:  Soft, non-tender, no masses EXTREMITIES:  Moves all extremities well, without clubbing, cyanosis, or edema NEUROLOGIC:  Alert and oriented x 3, normal gait, CN II-XII grossly intact MENTAL STATUS:  appropriate BACK:  Non-tender to palpation, No CVAT SKIN:  Warm, dry, and intact   Results: U/A: 0-5 WBCs, 0-2 RBCs

## 2024-04-22 ENCOUNTER — Other Ambulatory Visit: Payer: Self-pay | Admitting: Emergency Medicine

## 2024-04-22 DIAGNOSIS — E1169 Type 2 diabetes mellitus with other specified complication: Secondary | ICD-10-CM

## 2024-04-24 ENCOUNTER — Ambulatory Visit: Admitting: Emergency Medicine

## 2024-05-07 ENCOUNTER — Ambulatory Visit: Admitting: Emergency Medicine

## 2024-05-08 ENCOUNTER — Encounter: Payer: Self-pay | Admitting: Emergency Medicine

## 2024-05-08 ENCOUNTER — Ambulatory Visit: Admitting: Emergency Medicine

## 2024-05-08 VITALS — BP 128/68 | HR 84 | Temp 97.5°F | Ht 73.0 in | Wt 283.0 lb

## 2024-05-08 DIAGNOSIS — Z122 Encounter for screening for malignant neoplasm of respiratory organs: Secondary | ICD-10-CM

## 2024-05-08 DIAGNOSIS — I152 Hypertension secondary to endocrine disorders: Secondary | ICD-10-CM

## 2024-05-08 DIAGNOSIS — R011 Cardiac murmur, unspecified: Secondary | ICD-10-CM | POA: Diagnosis not present

## 2024-05-08 DIAGNOSIS — E1169 Type 2 diabetes mellitus with other specified complication: Secondary | ICD-10-CM

## 2024-05-08 DIAGNOSIS — E785 Hyperlipidemia, unspecified: Secondary | ICD-10-CM | POA: Diagnosis not present

## 2024-05-08 DIAGNOSIS — F17200 Nicotine dependence, unspecified, uncomplicated: Secondary | ICD-10-CM

## 2024-05-08 DIAGNOSIS — E1159 Type 2 diabetes mellitus with other circulatory complications: Secondary | ICD-10-CM

## 2024-05-08 DIAGNOSIS — J410 Simple chronic bronchitis: Secondary | ICD-10-CM | POA: Insufficient documentation

## 2024-05-08 DIAGNOSIS — N400 Enlarged prostate without lower urinary tract symptoms: Secondary | ICD-10-CM

## 2024-05-08 LAB — POCT GLYCOSYLATED HEMOGLOBIN (HGB A1C): HbA1c POC (<> result, manual entry): 5.9 % (ref 4.0–5.6)

## 2024-05-08 LAB — CBC WITH DIFFERENTIAL/PLATELET
Basophils Absolute: 0.1 K/uL (ref 0.0–0.1)
Basophils Relative: 0.7 % (ref 0.0–3.0)
Eosinophils Absolute: 0.2 K/uL (ref 0.0–0.7)
Eosinophils Relative: 1.9 % (ref 0.0–5.0)
HCT: 48 % (ref 39.0–52.0)
Hemoglobin: 16.6 g/dL (ref 13.0–17.0)
Lymphocytes Relative: 23.3 % (ref 12.0–46.0)
Lymphs Abs: 2 K/uL (ref 0.7–4.0)
MCHC: 34.6 g/dL (ref 30.0–36.0)
MCV: 98 fl (ref 78.0–100.0)
Monocytes Absolute: 0.5 K/uL (ref 0.1–1.0)
Monocytes Relative: 5.8 % (ref 3.0–12.0)
Neutro Abs: 5.8 K/uL (ref 1.4–7.7)
Neutrophils Relative %: 68.3 % (ref 43.0–77.0)
Platelets: 247 K/uL (ref 150.0–400.0)
RBC: 4.89 Mil/uL (ref 4.22–5.81)
RDW: 13.4 % (ref 11.5–15.5)
WBC: 8.5 K/uL (ref 4.0–10.5)

## 2024-05-08 LAB — COMPREHENSIVE METABOLIC PANEL WITH GFR
ALT: 17 U/L (ref 0–53)
AST: 20 U/L (ref 0–37)
Albumin: 4.7 g/dL (ref 3.5–5.2)
Alkaline Phosphatase: 60 U/L (ref 39–117)
BUN: 24 mg/dL — ABNORMAL HIGH (ref 6–23)
CO2: 25 meq/L (ref 19–32)
Calcium: 10.3 mg/dL (ref 8.4–10.5)
Chloride: 100 meq/L (ref 96–112)
Creatinine, Ser: 1.07 mg/dL (ref 0.40–1.50)
GFR: 70.12 mL/min (ref >60.00)
Glucose, Bld: 94 mg/dL (ref 70–99)
Potassium: 5.5 meq/L — ABNORMAL HIGH (ref 3.5–5.1)
Sodium: 136 meq/L (ref 135–145)
Total Bilirubin: 0.7 mg/dL (ref 0.2–1.2)
Total Protein: 8 g/dL (ref 6.0–8.3)

## 2024-05-08 LAB — MICROALBUMIN / CREATININE URINE RATIO
Creatinine,U: 83.5 mg/dL
Microalb Creat Ratio: 404.8 mg/g — ABNORMAL HIGH (ref 0.0–30.0)
Microalb, Ur: 33.8 mg/dL — ABNORMAL HIGH (ref 0.0–1.9)

## 2024-05-08 LAB — LIPID PANEL
Cholesterol: 169 mg/dL (ref 0–200)
HDL: 46.5 mg/dL (ref >39.00)
LDL Cholesterol: 88 mg/dL (ref 0–99)
NonHDL: 122.1
Total CHOL/HDL Ratio: 4
Triglycerides: 171 mg/dL — ABNORMAL HIGH (ref 0.0–149.0)
VLDL: 34.2 mg/dL (ref 0.0–40.0)

## 2024-05-08 NOTE — Assessment & Plan Note (Signed)
 Recent urologist office visit notes reviewed Continues having some symptoms Continues Rapaflo  8 mg daily Was recently started on Vesicare  5 mg daily.  Not really making much of a difference Scheduled for follow-up with urologist next month

## 2024-05-08 NOTE — Progress Notes (Signed)
 Gabriel Fernandez 68 y.o.   Chief Complaint  Patient presents with   Follow-up    6 months No concerns     HISTORY OF PRESENT ILLNESS: This is a 68 y.o. male here for 63-month follow-up of chronic medical conditions Overall doing well.  Has no complaints or medical concerns today.  HPI   Prior to Admission medications   Medication Sig Start Date End Date Taking? Authorizing Provider  atorvastatin  (LIPITOR) 20 MG tablet TAKE 1 TABLET BY MOUTH EVERY DAY 04/22/24  Yes Brinley Rosete, Emil Schanz, MD  clobetasol  cream (TEMOVATE ) 0.05 % Apply 1 Application topically 2 (two) times daily. 04/03/23  Yes Jaiel Saraceno Jose, MD  dapagliflozin  propanediol (FARXIGA ) 10 MG TABS tablet Take 1 tablet (10 mg total) by mouth daily before breakfast. 11/14/23  Yes Pria Klosinski, Emil Schanz, MD  lisinopril  (ZESTRIL ) 20 MG tablet TAKE 1 TABLET BY MOUTH EVERY DAY 10/25/23  Yes Dawid Dupriest, Emil Schanz, MD  metFORMIN  (GLUCOPHAGE ) 500 MG tablet TAKE 1 TABLET BY MOUTH 2 TIMES DAILY WITH A MEAL. 04/22/24  Yes Huriel Matt, Emil Schanz, MD  silodosin  (RAPAFLO ) 8 MG CAPS capsule Take 1 capsule (8 mg total) by mouth daily with breakfast. 02/29/24  Yes Stoneking, Adine PARAS., MD  solifenacin  (VESICARE ) 5 MG tablet Take 1 tablet (5 mg total) by mouth daily. 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   Unilateral primary osteoarthritis, left hip    Hypertension associated with diabetes (HCC) 03/04/2021   Dyslipidemia associated with type 2 diabetes mellitus (HCC) 01/30/2020   Peripheral arterial disease 01/30/2020   Chronic venous insufficiency 01/30/2020   Benign prostatic hyperplasia 01/30/2020   Tobacco use disorder, continuous 01/30/2020   Current smoker 06/08/2013   Other and unspecified hyperlipidemia 06/08/2013    Gout 06/08/2013   Morbid obesity (HCC) 06/08/2013    Past Medical History:  Diagnosis Date   Allergy    seasonal allergies   Arthritis    Diabetes mellitus without complication (HCC)    type 2, non-insulin  dependent   GERD (gastroesophageal reflux disease)    Gout    Hearing loss    dos not use hearing aids   Hyperlipidemia    Sleep apnea    Not on CPAP    Past Surgical History:  Procedure Laterality Date   COLONOSCOPY     sev colon's    POLYPECTOMY     TOTAL HIP ARTHROPLASTY Left 09/20/2021   Procedure: LEFT TOTAL HIP ARTHROPLASTY ANTERIOR APPROACH;  Surgeon: Jerri Kay HERO, MD;  Location: [facility] OR;  Service: Orthopedics;  Laterality: Left;  3-C    Social History   Socioeconomic History   Marital status: Married    Spouse name: Adelle   Number of children: 1   Years of education: Not on file   Highest education level: Bachelor's degree (e.g., BA, AB, BS)  Occupational History   Occupation: RETIRED  Tobacco Use   Smoking status: Some Days    Current packs/day: 1.00    Average packs/day: 1 pack/day for 35.0 years (35.0 ttl pk-yrs)    Types: Cigarettes   Smokeless tobacco: Never   Tobacco comments:    1-2 cigs a day or more   Vaping Use   Vaping status: Never Used  Substance and Sexual Activity   Alcohol  use: Yes    Alcohol/week: 12.0 standard drinks of alcohol    Types: 6 Glasses of wine, 6 Cans of beer per week   Drug use: No   Sexual activity: Not Currently  Other Topics Concern   Not on file  Social History Narrative   Lives at home with wife-and 1 dog   Social Drivers of Health   Financial Resource Strain: Low Risk  (05/06/2024)   Overall Financial Resource Strain (CARDIA)    Difficulty of Paying Living Expenses: Not hard at all  Food Insecurity: No Food Insecurity (05/06/2024)   Hunger Vital Sign    Worried About Running Out of Food in the Last Year: Never true    Ran Out of Food in the Last Year: Never true  Transportation Needs: No Transportation  Needs (05/06/2024)   PRAPARE - Administrator, Civil Service (Medical): No    Lack of Transportation (Non-Medical): No  Physical Activity: Inactive (05/06/2024)   Exercise Vital Sign    Days of Exercise per Week: 0 days    Minutes of Exercise per Session: Not on file  Stress: No Stress Concern Present (05/06/2024)   [employer] of Occupational Health - Occupational Stress Questionnaire    Feeling of Stress: Only a little  Social Connections: Socially Isolated (05/06/2024)   Social Connection and Isolation Panel    Frequency of Communication with Friends and Family: Once a week    Frequency of Social Gatherings with Friends and Family: Once a week    Attends Religious Services: Never    Database Administrator or Organizations: No    Attends Engineer, Structural: Not on file    Marital Status: Married  Catering Manager Violence: Not At Risk (08/02/2023)   Humiliation, Afraid, Rape, and Kick questionnaire    Fear of Current or Ex-Partner: No    Emotionally Abused: No    Physically Abused: No    Sexually Abused: No    Family History  Problem Relation Age of Onset   Cancer Mother    Ovarian cancer Mother    Stomach cancer Mother    Colon cancer Neg Hx    Esophageal cancer Neg Hx    Rectal cancer Neg Hx    Colon polyps Neg Hx      Review of Systems  Constitutional: Negative.  Negative for chills and fever.  HENT: Negative.  Negative for congestion and sore throat.   Respiratory:  Positive for cough. Negative for shortness of breath.   Cardiovascular: Negative.  Negative for chest pain and palpitations.  Gastrointestinal:  Negative for abdominal pain, diarrhea, nausea and vomiting.  Genitourinary: Negative.  Negative for dysuria and urgency.  Skin: Negative.  Negative for rash.  Neurological: Negative.  Negative for dizziness and headaches.  All other systems reviewed and are negative.   Today's Vitals   05/08/24 1337  BP: 128/68  Pulse: 84  Temp:  (!) 97.5 F (36.4 C)  TempSrc: Oral  SpO2: 97%  Weight: 283 lb (128.4 kg)  Height: 6' 1 (1.854 m)   Body mass index is 37.34 kg/m.   Physical Exam Vitals reviewed.  Constitutional:      Appearance: Normal appearance.  HENT:     Head: Normocephalic.     Mouth/Throat:     Mouth: Mucous membranes are moist.     Pharynx: Oropharynx is clear.  Eyes:     Extraocular Movements: Extraocular movements intact.     Conjunctiva/sclera: Conjunctivae normal.  Pupils: Pupils are equal, round, and reactive to light.  Cardiovascular:     Rate and Rhythm: Normal rate and regular rhythm.     Pulses: Normal pulses.     Heart sounds: Normal heart sounds.  Pulmonary:     Effort: Pulmonary effort is normal.     Breath sounds: Normal breath sounds.  Abdominal:     Palpations: Abdomen is soft.     Tenderness: There is no abdominal tenderness.  Musculoskeletal:     Cervical back: No tenderness.  Lymphadenopathy:     Cervical: No cervical adenopathy.  Skin:    General: Skin is warm and dry.     Capillary Refill: Capillary refill takes less than 2 seconds.  Neurological:     General: No focal deficit present.     Mental Status: He is alert and oriented to person, place, and time.  Psychiatric:        Mood and Affect: Mood normal.        Behavior: Behavior normal.      ASSESSMENT & PLAN: A total of 44 minutes was spent with the patient and counseling/coordination of care regarding preparing for this visit, review of most recent office visit notes, review of multiple chronic medical conditions and their management, review of all medications, review of most recent bloodwork results, review of health maintenance items, education on nutrition, prognosis, documentation, and need for follow up.   Problem List Items Addressed This Visit       Cardiovascular and Mediastinum   Hypertension associated with diabetes (HCC) - Primary   Well-controlled hypertension Continue lisinopril  20 mg  daily Well-controlled diabetes with hemoglobin A1c of 5.9 today Continue metformin  500 mg twice a day Continues Farxiga  10 mg daily Cardiovascular risks associated with hypertension and diabetes discussed Diet and nutrition discussed Benefits of exercise discussed      Relevant Orders   CBC with Differential/Platelet   Comprehensive metabolic panel with GFR   Lipid panel   Microalbumin / creatinine urine ratio     Respiratory   Smokers' cough (HCC)   Cardiovascular and cancer risks associated with smoking discussed Smoking cessation advice given Recommend screening for lung cancer with low-dose CT      Relevant Orders   Ambulatory Referral for Lung Cancer Scre     Endocrine   Dyslipidemia associated with type 2 diabetes mellitus (HCC)   Chronic stable conditions Continue atorvastatin  20 mg daily Diet and nutrition discussed      Relevant Orders   POCT HgB A1C (Completed)   Comprehensive metabolic panel with GFR   Lipid panel   Microalbumin / creatinine urine ratio     Genitourinary   Benign prostatic hyperplasia (Chronic)   Recent urologist office visit notes reviewed Continues having some symptoms Continues Rapaflo  8 mg daily Was recently started on Vesicare  5 mg daily.  Not really making much of a difference Scheduled for follow-up with urologist next month        Other   Current smoker   Cardiovascular and cancer risks associated with smoking discussed.  Smoking cessation advice given. Lung cancer screening recommended.      Relevant Orders   Ambulatory Referral for Lung Cancer Scre   Morbid obesity (HCC)   Diet and nutrition discussed Advised to decrease amount of daily carbohydrate intake and daily calories and increase amount of plant-based protein in his diet      Heart murmur   Conclusion(s)/Recommendation(s): Aortic calcification with mild aortic  stenosis. Severely dilated left atrium,  etiology unclear.      Other Visit Diagnoses        Screening for lung cancer       Relevant Orders   Ambulatory Referral for Lung Cancer Scre      Patient Instructions  Health Maintenance After Age 63 After age 73, you are at a higher risk for certain long-term diseases and infections as well as injuries from falls. Falls are a major cause of broken bones and head injuries in people who are older than age 70. Getting regular preventive care can help to keep you healthy and well. Preventive care includes getting regular testing and making lifestyle changes as recommended by your health care provider. Talk with your health care provider about: Which screenings and tests you should have. A screening is a test that checks for a disease when you have no symptoms. A diet and exercise plan that is right for you. What should I know about screenings and tests to prevent falls? Screening and testing are the best ways to find a health problem early. Early diagnosis and treatment give you the best chance of managing medical conditions that are common after age 48. Certain conditions and lifestyle choices may make you more likely to have a fall. Your health care provider may recommend: Regular vision checks. Poor vision and conditions such as cataracts can make you more likely to have a fall. If you wear glasses, make sure to get your prescription updated if your vision changes. Medicine review. Work with your health care provider to regularly review all of the medicines you are taking, including over-the-counter medicines. Ask your health care provider about any side effects that may make you more likely to have a fall. Tell your health care provider if any medicines that you take make you feel dizzy or sleepy. Strength and balance checks. Your health care provider may recommend certain tests to check your strength and balance while standing, walking, or changing positions. Foot health exam. Foot pain and numbness, as well as not wearing proper footwear, can  make you more likely to have a fall. Screenings, including: Osteoporosis screening. Osteoporosis is a condition that causes the bones to get weaker and break more easily. Blood pressure screening. Blood pressure changes and medicines to control blood pressure can make you feel dizzy. Depression screening. You may be more likely to have a fall if you have a fear of falling, feel depressed, or feel unable to do activities that you used to do. Alcohol use screening. Using too much alcohol can affect your balance and may make you more likely to have a fall. Follow these instructions at home: Lifestyle Do not drink alcohol if: Your health care provider tells you not to drink. If you drink alcohol: Limit how much you have to: 0-1 drink a day for women. 0-2 drinks a day for men. Know how much alcohol is in your drink. In the [place] one drink equals one 12 oz bottle of beer (355 mL), one 5 oz glass of wine (148 mL), or one 1 oz glass of hard liquor (44 mL). Do not use any products that contain nicotine  or tobacco. These products include cigarettes, chewing tobacco, and vaping devices, such as e-cigarettes. If you need help quitting, ask your health care provider. Activity  Follow a regular exercise program to stay fit. This will help you maintain your balance. Ask your health care provider what types of exercise are appropriate for you. If you need a cane or walker, use  it as recommended by your health care provider. Wear supportive shoes that have nonskid soles. Safety  Remove any tripping hazards, such as rugs, cords, and clutter. Install safety equipment such as grab bars in bathrooms and safety rails on stairs. Keep rooms and walkways well-lit. General instructions Talk with your health care provider about your risks for falling. Tell your health care provider if: You fall. Be sure to tell your health care provider about all falls, even ones that seem minor. You feel dizzy, tiredness  (fatigue), or off-balance. Take over-the-counter and prescription medicines only as told by your health care provider. These include supplements. Eat a healthy diet and maintain a healthy weight. A healthy diet includes low-fat dairy products, low-fat (lean) meats, and fiber from whole grains, beans, and lots of fruits and vegetables. Stay current with your vaccines. Schedule regular health, dental, and eye exams. Summary Having a healthy lifestyle and getting preventive care can help to protect your health and wellness after age 58. Screening and testing are the best way to find a health problem early and help you avoid having a fall. Early diagnosis and treatment give you the best chance for managing medical conditions that are more common for people who are older than age 42. Falls are a major cause of broken bones and head injuries in people who are older than age 41. Take precautions to prevent a fall at home. Work with your health care provider to learn what changes you can make to improve your health and wellness and to prevent falls. This information is not intended to replace advice given to you by your health care provider. Make sure you discuss any questions you have with your health care provider. Document Revised: 10/12/2020 Document Reviewed: 10/12/2020 Elsevier Patient Education  2024 [employer]     Emil Schaumann, MD [facility]

## 2024-05-08 NOTE — Assessment & Plan Note (Signed)
 Diet and nutrition discussed.  Advised to decrease amount of daily carbohydrate intake and daily calories and increase amount of plant based protein in his diet

## 2024-05-08 NOTE — Assessment & Plan Note (Signed)
 Cardiovascular and cancer risks associated with smoking discussed Smoking cessation advice given Recommend screening for lung cancer with low-dose CT

## 2024-05-08 NOTE — Patient Instructions (Signed)
 Health Maintenance After Age 70 After age 27, you are at a higher risk for certain long-term diseases and infections as well as injuries from falls. Falls are a major cause of broken bones and head injuries in people who are older than age 73. Getting regular preventive care can help to keep you healthy and well. Preventive care includes getting regular testing and making lifestyle changes as recommended by your health care provider. Talk with your health care provider about: Which screenings and tests you should have. A screening is a test that checks for a disease when you have no symptoms. A diet and exercise plan that is right for you. What should I know about screenings and tests to prevent falls? Screening and testing are the best ways to find a health problem early. Early diagnosis and treatment give you the best chance of managing medical conditions that are common after age 90. Certain conditions and lifestyle choices may make you more likely to have a fall. Your health care provider may recommend: Regular vision checks. Poor vision and conditions such as cataracts can make you more likely to have a fall. If you wear glasses, make sure to get your prescription updated if your vision changes. Medicine review. Work with your health care provider to regularly review all of the medicines you are taking, including over-the-counter medicines. Ask your health care provider about any side effects that may make you more likely to have a fall. Tell your health care provider if any medicines that you take make you feel dizzy or sleepy. Strength and balance checks. Your health care provider may recommend certain tests to check your strength and balance while standing, walking, or changing positions. Foot health exam. Foot pain and numbness, as well as not wearing proper footwear, can make you more likely to have a fall. Screenings, including: Osteoporosis screening. Osteoporosis is a condition that causes  the bones to get weaker and break more easily. Blood pressure screening. Blood pressure changes and medicines to control blood pressure can make you feel dizzy. Depression screening. You may be more likely to have a fall if you have a fear of falling, feel depressed, or feel unable to do activities that you used to do. Alcohol  use screening. Using too much alcohol  can affect your balance and may make you more likely to have a fall. Follow these instructions at home: Lifestyle Do not drink alcohol  if: Your health care provider tells you not to drink. If you drink alcohol : Limit how much you have to: 0-1 drink a day for women. 0-2 drinks a day for men. Know how much alcohol  is in your drink. In the [place] one drink equals one 12 oz bottle of beer (355 mL), one 5 oz glass of wine (148 mL), or one 1 oz glass of hard liquor (44 mL). Do not use any products that contain nicotine or tobacco. These products include cigarettes, chewing tobacco, and vaping devices, such as e-cigarettes. If you need help quitting, ask your health care provider. Activity  Follow a regular exercise program to stay fit. This will help you maintain your balance. Ask your health care provider what types of exercise are appropriate for you. If you need a cane or walker, use it as recommended by your health care provider. Wear supportive shoes that have nonskid soles. Safety  Remove any tripping hazards, such as rugs, cords, and clutter. Install safety equipment such as grab bars in bathrooms and safety rails on stairs. Keep rooms and walkways  well-lit. General instructions Talk with your health care provider about your risks for falling. Tell your health care provider if: You fall. Be sure to tell your health care provider about all falls, even ones that seem minor. You feel dizzy, tiredness (fatigue), or off-balance. Take over-the-counter and prescription medicines only as told by your health care provider. These include  supplements. Eat a healthy diet and maintain a healthy weight. A healthy diet includes low-fat dairy products, low-fat (lean) meats, and fiber from whole grains, beans, and lots of fruits and vegetables. Stay current with your vaccines. Schedule regular health, dental, and eye exams. Summary Having a healthy lifestyle and getting preventive care can help to protect your health and wellness after age 15. Screening and testing are the best way to find a health problem early and help you avoid having a fall. Early diagnosis and treatment give you the best chance for managing medical conditions that are more common for people who are older than age 42. Falls are a major cause of broken bones and head injuries in people who are older than age 64. Take precautions to prevent a fall at home. Work with your health care provider to learn what changes you can make to improve your health and wellness and to prevent falls. This information is not intended to replace advice given to you by your health care provider. Make sure you discuss any questions you have with your health care provider. Document Revised: 10/12/2020 Document Reviewed: 10/12/2020 Elsevier Patient Education  2024 [employer].

## 2024-05-08 NOTE — Assessment & Plan Note (Signed)
 Chronic stable conditions Continue atorvastatin  20 mg daily Diet and nutrition discussed

## 2024-05-08 NOTE — Assessment & Plan Note (Signed)
 Conclusion(s)/Recommendation(s): Aortic calcification with mild aortic  stenosis. Severely dilated left atrium, etiology unclear.

## 2024-05-08 NOTE — Assessment & Plan Note (Signed)
 Well-controlled hypertension Continue lisinopril  20 mg daily Well-controlled diabetes with hemoglobin A1c of 5.9 today Continue metformin  500 mg twice a day Continues Farxiga  10 mg daily Cardiovascular risks associated with hypertension and diabetes discussed Diet and nutrition discussed Benefits of exercise discussed

## 2024-05-08 NOTE — Assessment & Plan Note (Signed)
Cardiovascular and cancer risks associated with smoking discussed.  Smoking cessation advice given. Lung cancer screening recommended.

## 2024-05-09 ENCOUNTER — Ambulatory Visit: Payer: Self-pay | Admitting: Emergency Medicine

## 2024-06-11 ENCOUNTER — Telehealth: Payer: Self-pay

## 2024-06-11 DIAGNOSIS — Z122 Encounter for screening for malignant neoplasm of respiratory organs: Secondary | ICD-10-CM

## 2024-06-11 DIAGNOSIS — Z87891 Personal history of nicotine dependence: Secondary | ICD-10-CM

## 2024-06-11 DIAGNOSIS — F17210 Nicotine dependence, cigarettes, uncomplicated: Secondary | ICD-10-CM

## 2024-06-11 NOTE — Telephone Encounter (Signed)
 Lung Cancer Screening Narrative/Criteria Questionnaire (Cigarette Smokers Only- No Cigars/Pipes/vapes)   Gabriel Fernandez   SDMV:06/17/2024 at 11:45 am [place]         1955/09/30               LDCT: 06/20/2024 at 12:00 pm GI    69 y.o.   Phone: [phone]  Lung Screening Narrative (confirm age 41-77 yrs Medicare / 50-80 yrs Private pay insurance)   Insurance information: [facility] Medicare   Referring Provider: [place]  This screening involves an initial phone call with a team member from our program. It is called a shared decision making visit. The initial meeting is required by  insurance and Medicare to make sure you understand the program. This appointment takes about 15-20 minutes to complete. You will complete the screening scan at your scheduled date/time.  This scan takes about 5-10 minutes to complete. You can eat and drink normally before and after the scan.  Criteria questions for Lung Cancer Screening:   Are you a current or former smoker? Current Age began smoking: 71   If you are a former smoker, what year did you quit smoking? Never quit over one year. (within 15 yrs)   To calculate your smoking history, I need an accurate estimate of how many packs of cigarettes you smoked per day and for how many years. (Not just the number of PPD you are now smoking)   Years smoking 51 x Packs per day 2 = Pack years 102   (at least 20 pack yrs)   (Make sure they understand that we need to know how much they have smoked in the past, not just the number of PPD they are smoking now)  Do you have a personal history of cancer?  No    Do you have a family history of cancer? Mother had unknonwn type.   Are you coughing up blood?  No  Have you had unexplained weight loss of 15 lbs or more in the last 6 months? No  It looks like you meet all criteria.  When would be a good time for us  to schedule you for this screening?   Additional information: N/A

## 2024-06-17 ENCOUNTER — Encounter: Admitting: Adult Health

## 2024-06-17 ENCOUNTER — Ambulatory Visit: Admitting: Acute Care

## 2024-06-17 DIAGNOSIS — Z122 Encounter for screening for malignant neoplasm of respiratory organs: Secondary | ICD-10-CM

## 2024-06-17 DIAGNOSIS — Z720 Tobacco use: Secondary | ICD-10-CM

## 2024-06-17 NOTE — Progress Notes (Signed)
 Virtual Visit via Telephone Note  I connected with Gabriel Fernandez on 06/17/2024 at  1:30 PM EST by telephone and verified that I am speaking with the correct person using two identifiers.  Location: Patient: At home, in [place]  Provider: [address], [place], [place], [address]    I discussed the limitations, risks, security and privacy concerns of performing an evaluation and management service by telephone and the availability of in person appointments. I also discussed with the patient that there may be a patient responsible charge related to this service. The patient expressed understanding and agreed to proceed.  Shared Decision Making Visit Lung Cancer Screening Program [phone])   Eligibility: Age 69 y.o. Pack Years Smoking History Calculation 102 pack years  (# packs/per year x # years smoked) Recent History of coughing up blood  no Unexplained weight loss? no ( >Than 15 pounds within the last 6 months ) Prior History Lung / other cancer no (Diagnosis within the last 5 years already requiring surveillance chest CT Scans). Smoking Status Current Smoker Former Smokers: Years since quit: N/A  Quit Date: N/A  Visit Components: Discussion included one or more decision making aids. yes Discussion included risk/benefits of screening. yes Discussion included potential follow up diagnostic testing for abnormal scans. yes Discussion included meaning and risk of over diagnosis. yes Discussion included meaning and risk of False Positives. yes Discussion included meaning of total radiation exposure. yes  Counseling Included: Importance of adherence to annual lung cancer LDCT screening. yes Impact of comorbidities on ability to participate in the program. yes Ability and willingness to under diagnostic treatment. yes  Smoking Cessation Counseling: Current Smokers:  Discussed importance of smoking cessation. yes Information about tobacco cessation classes and interventions  provided to patient. yes Patient provided with ticket for LDCT Scan. N/A Symptomatic Patient. yes  Counseling(Intermediate counseling: > three minutes) [address]406 Diagnosis Code: Tobacco Use Z72.0 Asymptomatic Patient no  Counseling N/A Former Smokers:  Discussed the importance of maintaining cigarette abstinence. N/A Diagnosis Code: Personal History of Nicotine  Dependence. S12.108 Information about tobacco cessation classes and interventions provided to patient. Yes Patient provided with ticket for LDCT Scan. N/A Written Order for Lung Cancer Screening with LDCT placed in Epic. Yes (CT Chest Lung Cancer Screening Low Dose W/O CM) [id number] Z12.2-Screening of respiratory organs Z87.891-Personal history of nicotine  dependence  Gabriel Fernandez is a current user of tobacco or nicotine  products. He is considering quitting at this time. He is currently smoking 1ppd and has cut back on drinking alcohol as that seems to make him smoke more. Discussed alternative strategies like reducing to quit, nicotine  replacement therapy, and hypnotherapy. Counseling provided today addressed the risks of continued use and the benefits of cessation. Discussed tobacco/nicotine  use history, readiness to quit, and evidence-based treatment options including behavioral strategies, support resources, and pharmacologic therapies. Provided encouragement and educational materials on steps and resources to quit smoking. Patient questions were addressed, and follow-up recommended for continued support. Total time spent on counseling: 4 minutes.   Shared decision visit completed by Wells Georgia, FNP as a registered nurse awaiting credentialing.    Wells CHRISTELLA Georgia, FNP

## 2024-06-17 NOTE — Patient Instructions (Addendum)
 Thank you for participating in the [facility] Screening Program. It was our pleasure to meet you today. We will call you with the results of your scan within the next few days. Your scan will be assigned a Lung RADS category score by the physicians reading the scans.  This Lung RADS score determines follow up scanning.  See below for description of categories, and follow up screening recommendations. We will be in touch to schedule your follow up screening annually or based on recommendations of our providers. We will fax a copy of your scan results to your Primary Care Physician, or the physician who referred you to the program, to ensure they have the results. Please call the office if you have any questions or concerns regarding your scanning experience or results.  Our office number is [phone]. Please speak with Karna Curly, RN., Karna Doom RN, or [facility] RN, and Isaiah Dover RN. They are  our Lung Cancer Screening RN.'s If They are unavailable when you call, Please leave a message on the voice mail. We will return your call at our earliest convenience.This voice mail is monitored several times a day.  Remember, if your scan is normal, we will scan you annually as long as you continue to meet the criteria for the program. (Age 30-80, Current smoker or smoker who has quit within the last 15 years). If you are a smoker, remember, quitting is the single most powerful action that you can take to decrease your risk of lung cancer and other pulmonary, breathing related problems. We know quitting is hard, and we are here to help.  Please let us  know if there is anything we can do to help you meet your goal of quitting. If you are a former smoker, counselling psychologist. We are proud of you! Remain smoke free! Remember you can refer friends or family members through the number above.  We will screen them to make sure they meet criteria for the program. Thank you for helping us   take better care of you by participating in Lung Screening.  For Virtual Smoking Cessation Classes , The American Lung Association Provides  Freedom From Smoking Classes.  Please search their website for dates and times.    Lung RADS Categories:  Lung RADS 1: no nodules or definitely non-concerning nodules.  Recommendation is for a repeat annual scan in 12 months.  Lung RADS 2:  nodules that are non-concerning in appearance and behavior with a very low likelihood of becoming an active cancer. Recommendation is for a repeat annual scan in 12 months.  Lung RADS 3: nodules that are probably non-concerning , includes nodules with a low likelihood of becoming an active cancer.  Recommendation is for a 86-month repeat screening scan. Often noted after an upper respiratory illness. We will be in touch to make sure you have no questions, and to schedule your 11-month scan.  Lung RADS 4 A: nodules with concerning findings, recommendation is most often for a follow up scan in 3 months or additional testing based on our provider's assessment of the scan. We will be in touch to make sure you have no questions and to schedule the recommended 3 month follow up scan.  Lung RADS 4 B:  indicates findings that are concerning. We will be in touch with you to schedule additional diagnostic testing based on our provider's  assessment of the scan.  Smoking Cessation  Tips to Quit:  *Reduce to Quit: Gradually decrease the number of cigarettes  smoked per day with or without the help of nicotine  replacement (patches, gum, lozenges). Example: 1 ppd (20 cigarettes) X 1 week, 19 cigarettes next week, 18 cigarettes next week, etc and using nicotine  (pathc, gum, lozenges) for any breakthrough cravings. * Pick a Quit Day within the next week.  Remove temptations: toss cigarettes, lighters, ashtrays.  Tell someone you trust for support.  Avoid triggers like stress, boredom, or being around smokers.  Use healthy  replacements: water, gum, walking, deep breaths.  Stay busy with hobbies, music, drawing, or exercise.  Be patient with yourself--slipping once doesnt mean failure.   [place] Smoking/Nicotine  Cessation Resources  If youre ready to quit TODAY, our virtual care team is available to start your journey to a tobacco free life.  Appointments are available from 8 a.m. to 8 p.m. Monday to Friday. Most health insurance plans will cover some level of tobacco cessation visits and medications.  To make a virtual appointment and access other resources, follow the link: severties.nl   [facility] (1-800-QUITNOW) [facility] offers free combination nicotine  replacement therapy (patches plus either gum or lozenges). When used along with counseling, this will more than double your chances of quitting successfully.  Call 1-800-QUIT NOW or Text: Ready to 34191.   Spanish language portal: 1-855-DEJELO-YA (614) 316-6686).  TTY: 727-156-8936 [place] Quitline: Call 888-7AI-QUIT 661-326-6500) http://carroll-castaneda.info/  The American Lung Association offers Freedom From Smoking Programs Self guided or group programs offered, check their website for free virtual programs available to [facility] residents Lung Helpline at 1-800-LUNGUSA  http://keith.biz/  *[employer] Offers tools and tips to quit smoking. Create a quit plan.  Free quitSTART app:   Monitor progress, manage cravings, access tools, and more with the app.  portablegrid.se  *Hypnosis for smoking cessation  [employer] (971)021-1129  Acupuncture for smoking cessation  [employer] 928-332-2519    Please let us  know how we can support you as you quit smoking. I know this is hard, but you can do this!    Your CT scan is scheduled for 06/20/2024 at 12 pm at [facility]

## 2024-06-20 ENCOUNTER — Ambulatory Visit
Admission: RE | Admit: 2024-06-20 | Discharge: 2024-06-20 | Disposition: A | Source: Ambulatory Visit | Attending: Acute Care | Admitting: Acute Care

## 2024-06-20 DIAGNOSIS — F17210 Nicotine dependence, cigarettes, uncomplicated: Secondary | ICD-10-CM

## 2024-06-20 DIAGNOSIS — Z122 Encounter for screening for malignant neoplasm of respiratory organs: Secondary | ICD-10-CM

## 2024-06-20 DIAGNOSIS — Z87891 Personal history of nicotine dependence: Secondary | ICD-10-CM

## 2024-07-01 ENCOUNTER — Other Ambulatory Visit: Payer: Self-pay

## 2024-07-01 DIAGNOSIS — F17210 Nicotine dependence, cigarettes, uncomplicated: Secondary | ICD-10-CM

## 2024-07-01 DIAGNOSIS — Z87891 Personal history of nicotine dependence: Secondary | ICD-10-CM

## 2024-07-01 DIAGNOSIS — Z122 Encounter for screening for malignant neoplasm of respiratory organs: Secondary | ICD-10-CM

## 2024-08-01 ENCOUNTER — Ambulatory Visit

## 2024-08-02 ENCOUNTER — Ambulatory Visit: Payer: Medicare Other

## 2024-08-21 ENCOUNTER — Ambulatory Visit: Admitting: Urology

## 2024-11-06 ENCOUNTER — Ambulatory Visit: Admitting: Emergency Medicine

## 2024-12-31 ENCOUNTER — Ambulatory Visit
